# Patient Record
Sex: Female | Born: 1945 | Race: Black or African American | Hispanic: No | Smoking: Former smoker
Health system: Southern US, Community
[De-identification: ages and names within clinical notes are randomized; demographics above are authoritative.]

## PROBLEM LIST (undated history)

## (undated) DIAGNOSIS — I4729 Other ventricular tachycardia: Secondary | ICD-10-CM

## (undated) DIAGNOSIS — K219 Gastro-esophageal reflux disease without esophagitis: Secondary | ICD-10-CM

## (undated) DIAGNOSIS — I499 Cardiac arrhythmia, unspecified: Secondary | ICD-10-CM

## (undated) DIAGNOSIS — G4730 Sleep apnea, unspecified: Secondary | ICD-10-CM

## (undated) DIAGNOSIS — I714 Abdominal aortic aneurysm, without rupture, unspecified: Secondary | ICD-10-CM

## (undated) DIAGNOSIS — I10 Essential (primary) hypertension: Secondary | ICD-10-CM

## (undated) DIAGNOSIS — D649 Anemia, unspecified: Secondary | ICD-10-CM

## (undated) DIAGNOSIS — F419 Anxiety disorder, unspecified: Secondary | ICD-10-CM

## (undated) DIAGNOSIS — R569 Unspecified convulsions: Secondary | ICD-10-CM

## (undated) DIAGNOSIS — I472 Ventricular tachycardia: Secondary | ICD-10-CM

## (undated) DIAGNOSIS — I701 Atherosclerosis of renal artery: Secondary | ICD-10-CM

## (undated) DIAGNOSIS — Z95810 Presence of automatic (implantable) cardiac defibrillator: Secondary | ICD-10-CM

## (undated) DIAGNOSIS — I4581 Long QT syndrome: Secondary | ICD-10-CM

## (undated) DIAGNOSIS — I739 Peripheral vascular disease, unspecified: Secondary | ICD-10-CM

## (undated) DIAGNOSIS — G629 Polyneuropathy, unspecified: Secondary | ICD-10-CM

## (undated) DIAGNOSIS — I5022 Chronic systolic (congestive) heart failure: Secondary | ICD-10-CM

## (undated) DIAGNOSIS — Z91199 Patient's noncompliance with other medical treatment and regimen due to unspecified reason: Secondary | ICD-10-CM

## (undated) DIAGNOSIS — E785 Hyperlipidemia, unspecified: Secondary | ICD-10-CM

## (undated) DIAGNOSIS — E119 Type 2 diabetes mellitus without complications: Secondary | ICD-10-CM

## (undated) DIAGNOSIS — J90 Pleural effusion, not elsewhere classified: Secondary | ICD-10-CM

## (undated) DIAGNOSIS — I2510 Atherosclerotic heart disease of native coronary artery without angina pectoris: Secondary | ICD-10-CM

## (undated) DIAGNOSIS — I255 Ischemic cardiomyopathy: Secondary | ICD-10-CM

## (undated) DIAGNOSIS — M797 Fibromyalgia: Secondary | ICD-10-CM

## (undated) DIAGNOSIS — Z9119 Patient's noncompliance with other medical treatment and regimen: Secondary | ICD-10-CM

## (undated) HISTORY — DX: Chronic systolic (congestive) heart failure: I50.22

## (undated) HISTORY — DX: Atherosclerotic heart disease of native coronary artery without angina pectoris: I25.10

## (undated) HISTORY — DX: Abdominal aortic aneurysm, without rupture: I71.4

## (undated) HISTORY — DX: Peripheral vascular disease, unspecified: I73.9

## (undated) HISTORY — DX: Atherosclerosis of renal artery: I70.1

## (undated) HISTORY — PX: THORACENTESIS: SHX235

## (undated) HISTORY — PX: BACK SURGERY: SHX140

## (undated) HISTORY — DX: Patient's noncompliance with other medical treatment and regimen due to unspecified reason: Z91.199

## (undated) HISTORY — DX: Ventricular tachycardia: I47.2

## (undated) HISTORY — DX: Other ventricular tachycardia: I47.29

## (undated) HISTORY — DX: Long QT syndrome: I45.81

## (undated) HISTORY — DX: Hyperlipidemia, unspecified: E78.5

## (undated) HISTORY — DX: Essential (primary) hypertension: I10

## (undated) HISTORY — DX: Abdominal aortic aneurysm, without rupture, unspecified: I71.40

## (undated) HISTORY — DX: Anemia, unspecified: D64.9

## (undated) HISTORY — DX: Ischemic cardiomyopathy: I25.5

## (undated) HISTORY — DX: Patient's noncompliance with other medical treatment and regimen: Z91.19

## (undated) HISTORY — PX: CARDIAC DEFIBRILLATOR PLACEMENT: SHX171

## (undated) HISTORY — DX: Pleural effusion, not elsewhere classified: J90

---
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
## 1968-11-11 HISTORY — PX: KNEE RECONSTRUCTION: SHX5883

## 1973-02-11 HISTORY — PX: HEMORROIDECTOMY: SUR656

## 1984-02-12 HISTORY — PX: ABDOMINAL HYSTERECTOMY: SHX81

## 2001-03-30 ENCOUNTER — Encounter: Payer: Self-pay | Admitting: *Deleted

## 2001-03-30 ENCOUNTER — Inpatient Hospital Stay (HOSPITAL_COMMUNITY): Admission: RE | Admit: 2001-03-30 | Discharge: 2001-04-01 | Payer: Self-pay | Admitting: Unknown Physician Specialty

## 2001-04-01 ENCOUNTER — Encounter: Payer: Self-pay | Admitting: *Deleted

## 2001-06-03 ENCOUNTER — Ambulatory Visit (HOSPITAL_COMMUNITY): Admission: RE | Admit: 2001-06-03 | Discharge: 2001-06-03 | Payer: Self-pay | Admitting: Internal Medicine

## 2001-09-23 ENCOUNTER — Encounter: Admission: RE | Admit: 2001-09-23 | Discharge: 2001-09-23 | Payer: Self-pay | Admitting: Internal Medicine

## 2001-09-23 ENCOUNTER — Encounter: Payer: Self-pay | Admitting: Internal Medicine

## 2003-07-01 ENCOUNTER — Emergency Department (HOSPITAL_COMMUNITY): Admission: EM | Admit: 2003-07-01 | Discharge: 2003-07-01 | Payer: Self-pay | Admitting: Emergency Medicine

## 2004-07-31 ENCOUNTER — Ambulatory Visit (HOSPITAL_COMMUNITY): Admission: RE | Admit: 2004-07-31 | Discharge: 2004-07-31 | Payer: Self-pay | Admitting: Obstetrics and Gynecology

## 2005-11-25 ENCOUNTER — Ambulatory Visit: Payer: Self-pay

## 2006-01-01 ENCOUNTER — Encounter: Admission: RE | Admit: 2006-01-01 | Discharge: 2006-01-01 | Payer: Self-pay | Admitting: Internal Medicine

## 2006-01-16 ENCOUNTER — Encounter (INDEPENDENT_AMBULATORY_CARE_PROVIDER_SITE_OTHER): Payer: Self-pay | Admitting: *Deleted

## 2006-01-16 ENCOUNTER — Other Ambulatory Visit: Admission: RE | Admit: 2006-01-16 | Discharge: 2006-01-16 | Payer: Self-pay | Admitting: Interventional Radiology

## 2006-01-16 ENCOUNTER — Encounter: Admission: RE | Admit: 2006-01-16 | Discharge: 2006-01-16 | Payer: Self-pay | Admitting: Internal Medicine

## 2006-02-24 ENCOUNTER — Emergency Department (HOSPITAL_COMMUNITY): Admission: EM | Admit: 2006-02-24 | Discharge: 2006-02-24 | Payer: Self-pay | Admitting: Emergency Medicine

## 2006-04-30 ENCOUNTER — Encounter: Admission: RE | Admit: 2006-04-30 | Discharge: 2006-04-30 | Payer: Self-pay | Admitting: Family Medicine

## 2006-05-23 ENCOUNTER — Encounter: Admission: RE | Admit: 2006-05-23 | Discharge: 2006-05-23 | Payer: Self-pay | Admitting: Orthopaedic Surgery

## 2006-06-11 ENCOUNTER — Encounter: Admission: RE | Admit: 2006-06-11 | Discharge: 2006-06-11 | Payer: Self-pay | Admitting: Orthopaedic Surgery

## 2006-06-12 HISTORY — PX: LUMBAR LAMINECTOMY: SHX95

## 2006-06-25 ENCOUNTER — Ambulatory Visit (HOSPITAL_COMMUNITY): Admission: RE | Admit: 2006-06-25 | Discharge: 2006-06-26 | Payer: Self-pay | Admitting: Orthopaedic Surgery

## 2007-08-27 ENCOUNTER — Encounter: Admission: RE | Admit: 2007-08-27 | Discharge: 2007-08-27 | Payer: Self-pay | Admitting: Orthopaedic Surgery

## 2007-10-28 ENCOUNTER — Ambulatory Visit (HOSPITAL_COMMUNITY): Admission: RE | Admit: 2007-10-28 | Discharge: 2007-10-28 | Payer: Self-pay | Admitting: Orthopaedic Surgery

## 2007-11-19 ENCOUNTER — Ambulatory Visit: Payer: Self-pay

## 2007-11-19 ENCOUNTER — Ambulatory Visit: Payer: Self-pay | Admitting: Internal Medicine

## 2007-11-19 ENCOUNTER — Encounter: Payer: Self-pay | Admitting: Internal Medicine

## 2007-11-20 ENCOUNTER — Inpatient Hospital Stay (HOSPITAL_COMMUNITY): Admission: RE | Admit: 2007-11-20 | Discharge: 2007-11-24 | Payer: Self-pay | Admitting: Orthopaedic Surgery

## 2007-11-20 HISTORY — PX: POSTERIOR LUMBAR FUSION: SHX6036

## 2008-03-28 DIAGNOSIS — I4581 Long QT syndrome: Secondary | ICD-10-CM

## 2008-05-10 ENCOUNTER — Encounter: Payer: Self-pay | Admitting: Internal Medicine

## 2008-07-15 ENCOUNTER — Ambulatory Visit: Payer: Self-pay | Admitting: Internal Medicine

## 2008-07-15 DIAGNOSIS — R5383 Other fatigue: Secondary | ICD-10-CM

## 2008-07-15 DIAGNOSIS — R0989 Other specified symptoms and signs involving the circulatory and respiratory systems: Secondary | ICD-10-CM

## 2008-07-15 DIAGNOSIS — R5381 Other malaise: Secondary | ICD-10-CM | POA: Insufficient documentation

## 2008-07-15 DIAGNOSIS — R0609 Other forms of dyspnea: Secondary | ICD-10-CM

## 2008-10-24 ENCOUNTER — Encounter (INDEPENDENT_AMBULATORY_CARE_PROVIDER_SITE_OTHER): Payer: Self-pay | Admitting: *Deleted

## 2009-02-16 ENCOUNTER — Encounter (INDEPENDENT_AMBULATORY_CARE_PROVIDER_SITE_OTHER): Payer: Self-pay | Admitting: *Deleted

## 2009-02-28 ENCOUNTER — Encounter: Payer: Self-pay | Admitting: Internal Medicine

## 2009-02-28 ENCOUNTER — Ambulatory Visit: Payer: Self-pay

## 2009-03-06 ENCOUNTER — Ambulatory Visit: Payer: Self-pay | Admitting: Internal Medicine

## 2009-03-06 DIAGNOSIS — I10 Essential (primary) hypertension: Secondary | ICD-10-CM

## 2009-03-06 DIAGNOSIS — F17200 Nicotine dependence, unspecified, uncomplicated: Secondary | ICD-10-CM

## 2009-03-06 LAB — CONVERTED CEMR LAB
BUN: 8 mg/dL (ref 6–23)
Basophils Relative: 0.7 % (ref 0.0–3.0)
CO2: 30 meq/L (ref 19–32)
Calcium: 10 mg/dL (ref 8.4–10.5)
Creatinine, Ser: 0.7 mg/dL (ref 0.4–1.2)
GFR calc non Af Amer: 109.27 mL/min (ref >60)
HCT: 44 % (ref 36.0–46.0)
Lymphs Abs: 1.6 10*3/uL (ref 0.7–4.0)
MCHC: 32.9 g/dL (ref 30.0–36.0)
MCV: 88.6 fL (ref 78.0–100.0)
Monocytes Absolute: 0.4 10*3/uL (ref 0.1–1.0)
Monocytes Relative: 5.4 % (ref 3.0–12.0)
Neutrophils Relative %: 69.7 % (ref 43.0–77.0)
Platelets: 287 10*3/uL (ref 150.0–400.0)
Prothrombin Time: 10.6 s (ref 9.1–11.7)
RBC: 4.97 M/uL (ref 3.87–5.11)
RDW: 13.8 % (ref 11.5–14.6)
WBC: 7 10*3/uL (ref 4.5–10.5)

## 2009-03-13 ENCOUNTER — Ambulatory Visit: Payer: Self-pay | Admitting: Internal Medicine

## 2009-03-13 ENCOUNTER — Ambulatory Visit (HOSPITAL_COMMUNITY): Admission: RE | Admit: 2009-03-13 | Discharge: 2009-03-13 | Payer: Self-pay | Admitting: Internal Medicine

## 2009-03-22 ENCOUNTER — Encounter: Payer: Self-pay | Admitting: Internal Medicine

## 2009-03-23 ENCOUNTER — Ambulatory Visit: Payer: Self-pay

## 2009-03-23 ENCOUNTER — Encounter: Payer: Self-pay | Admitting: Internal Medicine

## 2009-06-21 ENCOUNTER — Encounter (INDEPENDENT_AMBULATORY_CARE_PROVIDER_SITE_OTHER): Payer: Self-pay | Admitting: *Deleted

## 2009-09-10 ENCOUNTER — Emergency Department (HOSPITAL_COMMUNITY): Admission: EM | Admit: 2009-09-10 | Discharge: 2009-09-10 | Payer: Self-pay | Admitting: Emergency Medicine

## 2009-09-13 ENCOUNTER — Encounter (INDEPENDENT_AMBULATORY_CARE_PROVIDER_SITE_OTHER): Payer: Self-pay | Admitting: *Deleted

## 2009-12-28 ENCOUNTER — Encounter (INDEPENDENT_AMBULATORY_CARE_PROVIDER_SITE_OTHER): Payer: Self-pay | Admitting: *Deleted

## 2010-02-13 ENCOUNTER — Encounter (INDEPENDENT_AMBULATORY_CARE_PROVIDER_SITE_OTHER): Payer: Self-pay | Admitting: *Deleted

## 2010-03-02 ENCOUNTER — Encounter: Payer: Self-pay | Admitting: Internal Medicine

## 2010-03-04 ENCOUNTER — Encounter: Payer: Self-pay | Admitting: Orthopaedic Surgery

## 2010-03-12 ENCOUNTER — Emergency Department (HOSPITAL_COMMUNITY)
Admission: EM | Admit: 2010-03-12 | Discharge: 2010-03-12 | Payer: Self-pay | Source: Home / Self Care | Admitting: Emergency Medicine

## 2010-03-12 LAB — GLUCOSE, CAPILLARY: Glucose-Capillary: 102 mg/dL — ABNORMAL HIGH (ref 70–99)

## 2010-03-13 NOTE — Letter (Signed)
Summary: Appointment - Missed  [facility], Main Office  [address]   [place], [place] [address]   Phone: [phone]  Fax: [phone]     Jun 21, 2009 MRN: [id number]   Lori York [address] [place], [place]  [address]   Dear Ms. Backhaus,  Our records indicate you missed your appointment on 5/9 with Dr Johney Frame. It is very important that we reach you to reschedule this appointment. We look forward to participating in your health care needs. Please contact us at the number listed above at your earliest convenience to reschedule this appointment.     Sincerely,   Ruel Favors Scheduling Team

## 2010-03-13 NOTE — Assessment & Plan Note (Signed)
Summary: PER CHECK OUT/SF   Visit Type:  Follow-up Primary Provider:  Velna Hatchet MD   History of Present Illness: The patient presents today for routine electrophysiology followup. She reports doing very well since last being seen in our clinic. She has been noncompliant with office follow-ups, despite letters being sent to her.  She now presents after her ICD has reached ERI.  The patient denies symptoms of palpitations, chest pain, shortness of breath, orthopnea, PND, lower extremity edema, dizziness, presyncope, syncope, or neurologic sequela. The patient is tolerating medications without difficulties and is otherwise without complaint today.   Current Medications (verified): 1)  Metformin Hcl 500 Mg Xr24h-Tab (Metformin Hcl) .Marland Kitchen.. 1 By Mouth Once Daily 2)  Lotrel 10-40 Mg Caps (Amlodipine Besy-Benazepril Hcl) .Marland Kitchen.. 1 By Mouth Once Daily 3)  Vitamin D (Ergocalciferol) 50000 Unit Caps (Ergocalciferol) .... Weekly 4)  Nexium 40 Mg Cpdr (Esomeprazole Magnesium) .Marland Kitchen.. 1 By Mouth Once Daily 5)  Zolpidem Tartrate 10 Mg Tabs (Zolpidem Tartrate) .Marland Kitchen.. 1 By Mouth At Bedtime 6)  Paroxetine Hcl 20 Mg Tabs (Paroxetine Hcl) .Marland Kitchen.. 1 By Mouth Once Daily 7)  Glipizide 10 Mg Xr24h-Tab (Glipizide) .Marland Kitchen.. 1 By Mouth Once Daily 8)  Nadolol 20 Mg Tabs (Nadolol) .Marland Kitchen.. 1 By Mouth Once Daily  Allergies: 1)  ! Penicillin  Past History:  Past Medical History:  1. Long QT syndrome with family history of sudden cardiac death.   2. Status post pacemaker implantation in 1997 with revision to a  Guidant model 1860 single-chamber implantable cardioverter-  defibrillator on March 31, 2001.   3. Diabetes mellitus.   4. Hypertension.   5. History of glaucoma.   Past Surgical History: Reviewed history from 03/28/2008 and no changes required. 1.  Status post pacemaker implantation in 1997 with revision to a Guidant model 1860 single-chamber implantable cardioverter-  defibrillator on March 31, 2001.  2.   Laminectomy in May 2008. 3.  Total abdominal hysterectomy in 1986.  4.  Right knee reconstruction in 1970.  5.  Laminectomy/ L5-S1 fusion 11/20/07  Family History: Reviewed history from 03/28/2008 and no changes required. The patient has a history of long QT syndrome in her mother, daughter, sister, and brother, all died suddenly.  Social History: Reviewed history from 03/28/2008 and no changes required.  [place] with her adopted son who is 41 years old and a godbrother, who is 68.  She is disabled.  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  She smokes less than one-half pack per day and has no interest in quitting at this time.  She denies alcohol or drug use.   Review of Systems       All systems are reviewed and negative except as listed in the HPI.   Remains very anxious.  Significant social stress since the death of her son last year.  Vital Signs:  Patient profile:   65 year old female Height:      64 inches Weight:      203 pounds BMI:     34.97 Pulse rate:   67 / minute BP sitting:   156 / 98  (left arm)  Vitals Entered By: Laurance Flatten CMA (March 06, 2009 9:51 AM)  Physical Exam  General:  obese, NAD Head:  normocephalic and atraumatic Eyes:  PERRLA/EOM intact; conjunctiva and lids normal. Nose:  no deformity, discharge, inflammation, or lesions Mouth:  Teeth, gums and palate normal. Oral mucosa normal. Neck:  Neck supple, no JVD. No masses, thyromegaly or abnormal cervical nodes. Chest Wall:  ICD pocket well healed Lungs:  Clear bilaterally to auscultation and percussion. Heart:  Non-displaced PMI, chest non-tender; regular rate and rhythm, S1, S2 without murmurs, rubs or gallops. Carotid upstroke normal, no bruit. Normal abdominal aortic size, no bruits. Femorals normal pulses, no bruits. Pedals normal pulses. No edema, no varicosities. Abdomen:  Bowel sounds positive; abdomen soft and non-tender  without masses, organomegaly, or hernias noted. No hepatosplenomegaly. Msk:  Back normal, normal gait. Muscle strength and tone normal. Pulses:  pulses normal in all 4 extremities Extremities:  No clubbing or cyanosis. Neurologic:  Alert and oriented x 3. Skin:  Intact without lesions or rashes. Cervical Nodes:  no significant adenopathy Psych:  Normal affect.    ICD Specifications Following MD:  Hillis Range, MD     ICD Vendor:  [facility] [employer]     ICD Model Number:  [phone]     ICD Serial Number:  [id number] ICD DOI:  03/31/2001     ICD Implanting MD:  Sherryl Manges, MD  Lead 1:    Location: RV     DOI: 03/31/2001     Model #: [id number]     Serial #: [id number]     Status: active  Indications::  LONG QT   Episodes Coumadin:  No  Brady Parameters Mode VVI     Lower Rate Limit:  60      Tachy Zones VF:  190     VT:  160     Impression & Recommendations:  Problem # 1:  LONG QT SYNDROME (ICD-426.82)  The patient has a h/o long QT syndrome for which an ICD has previously been implanted for primary prevention of sudden death.  She has been noncompliant with office follow-up and now presents with a device at [facility].  She reports compliance with nadolol.   Risks, benefits, alternatives to ICD pulse generator replacement were discussed in detail with the patient today.  She understands that the risks include but are not limited to bleeding, infection, MI, stroke, death, inappropriate shocks, and lead revision.  She accepts these risks and wishes to proceed.  We will therefore plan on ICD pulse generator replacement at the next available time.   Her updated medication list for this problem includes:    Lotrel 10-40 Mg Caps (Amlodipine besy-benazepril hcl) .Marland Kitchen... 1 by mouth once daily    Nadolol 20 Mg Tabs (Nadolol) .Marland Kitchen... 1 by mouth once daily  Orders: TLB-BMP (Basic Metabolic Panel-BMET) (80048-METABOL) TLB-CBC Platelet - w/Differential (85025-CBCD) TLB-PT (Protime) (85610-PTP) TLB-PTT  (85730-PTTL)  Problem # 2:  SNORING (ICD-786.09) Pt has snoring and fatigue but continues to decline sleep study, despite my recommendation.  Problem # 3:  ESSENTIAL HYPERTENSION, BENIGN (ICD-401.1) stable no changes today  Her updated medication list for this problem includes:    Lotrel 10-40 Mg Caps (Amlodipine besy-benazepril hcl) .Marland Kitchen... 1 by mouth once daily    Nadolol 20 Mg Tabs (Nadolol) .Marland Kitchen... 1 by mouth once daily  Problem # 4:  TOBACCO ABUSE (ICD-305.1) cessation advised

## 2010-03-13 NOTE — Procedures (Signed)
Summary: DEVICE/SAF   Current Medications (verified): 1)  Metformin Hcl 500 Mg Xr24h-Tab (Metformin Hcl) .Marland Kitchen.. 1 By Mouth Once Daily 2)  Lotrel 10-40 Mg Caps (Amlodipine Besy-Benazepril Hcl) .Marland Kitchen.. 1 By Mouth Once Daily 3)  Vitamin D (Ergocalciferol) 50000 Unit Caps (Ergocalciferol) .... Weekly 4)  Nexium 40 Mg Cpdr (Esomeprazole Magnesium) .Marland Kitchen.. 1 By Mouth Once Daily 5)  Zolpidem Tartrate 10 Mg Tabs (Zolpidem Tartrate) .Marland Kitchen.. 1 By Mouth At Bedtime 6)  Paroxetine Hcl 20 Mg Tabs (Paroxetine Hcl) .Marland Kitchen.. 1 By Mouth Once Daily 7)  Glipizide 10 Mg Xr24h-Tab (Glipizide) .Marland Kitchen.. 1 By Mouth Once Daily 8)  Nadolol 20 Mg Tabs (Nadolol) .Marland Kitchen.. 1 By Mouth Once Daily  Allergies (verified): 1)  ! Penicillin    ICD Specifications Following MD:  Hillis Range, MD     ICD Vendor:  [facility] [employer]     ICD Model Number:  [phone]     ICD Serial Number:  [id number] ICD DOI:  03/31/2001     ICD Implanting MD:  Sherryl Manges, MD  Lead 1:    Location: RV     DOI: 03/31/2001     Model #: [id number]     Serial #: [id number]     Status: active  Indications::  LONG QT   ICD Follow Up Remote Check?  No Battery Voltage:  2.39 V     Charge Time:  20.2 seconds     Battery Est. Longevity:  ERI Underlying rhythm:  SR ICD Dependent:  No       ICD Device Measurements Right Ventricle:  Amplitude: 14.1 mV, Impedance: 452 ohms, Threshold: 0.8 V at 0.4 msec Shock Impedance: 47 ohms   Episodes Coumadin:  No Shock:  0     ATP:  0     Nonsustained:  0     Ventricular Pacing:  13%  Brady Parameters Mode VVI     Lower Rate Limit:  60      Tachy Zones VF:  200     VT:  160     Tech Comments:  Device @ ERI 12/27/2008 although the patient has only heard the "beeping" in the last week.  Device reprogrammed to 1 zone VF @ 200 per Dr. Johney Frame.  We will schedule her ASAP for an office visit to anticipate change out of the device. Altha Harm, LPN  February 28, 2009 10:28 AM

## 2010-03-13 NOTE — Letter (Signed)
Summary: Appointment - Missed  [facility], Main Office  [address]   [place], [place] [address]   Phone: [phone]  Fax: [phone]     September 13, 2009 MRN: [id number]   Lori York [address] [place], [place]  [address]   Dear [name],  Our records indicate you missed your appointment on July 22nd, 2011 with Dr. Johney Frame. It is very important that we reach you to reschedule this appointment. We look forward to participating in your health care needs. Please contact us at the number listed above at your earliest convenience to reschedule this appointment.     Sincerely,    Glass blower/designer

## 2010-03-13 NOTE — Letter (Signed)
Summary: Device-Delinquent Check  [facility], Main Office  [address]   [place], [place] [address]   Phone: [phone]  Fax: [phone]     December 28, 2009 MRN: [id number]   Lori York [address] [place], [place]  [address]   Dear Ms. Wordell,  According to our records, you have not had your implanted device checked in the recommended period of time.  We are unable to determine appropriate device function without checking your device on a regular basis.  Please call our office to schedule an appointment, with Dr Johney Frame,  as soon as possible.  If you are having your device checked by another physician, please call us so that we may update our records.  Thank you,  Letta Moynahan, EMT  December 28, 2009 10:48 AM  [facility]

## 2010-03-13 NOTE — Cardiovascular Report (Signed)
Summary: Pre Op Orders  Pre Op Orders   Imported By: Roderic Ovens 03/09/2009 11:15:03  _____________________________________________________________________  External Attachment:    Type:   Image     Comment:   External Document

## 2010-03-13 NOTE — Letter (Signed)
Summary: Appointment - Missed  [facility], Main Office  [address]   [place], [place] [address]   Phone: [phone]  Fax: [phone]     February 16, 2009 MRN: [id number]   Lori York [address] [place], [place]  [address]   Dear [name],  Our records indicate you missed your appointment on 01/23/09 with Dr. Johney Frame.                                    It is very important that we reach you to reschedule this appointment. We look forward to participating in your health care needs. Please contact us at the number listed above at your earliest convenience to reschedule this appointment.     Sincerely,    Glass blower/designer

## 2010-03-13 NOTE — Cardiovascular Report (Signed)
Summary: Office Visit   Office Visit   Imported By: Roderic Ovens 04/04/2009 11:59:04  _____________________________________________________________________  External Attachment:    Type:   Image     Comment:   External Document

## 2010-03-13 NOTE — Miscellaneous (Signed)
Summary: Device change out  Clinical Lists Changes  Observations: Added new observation of ICD IMP MD: Hillis Range, MD (03/22/2009 15:34) Added new observation of ICD IMPL DTE: 03/13/2009 (03/22/2009 15:34) Added new observation of ICD SERL#: [id number]  (03/22/2009 15:34) Added new observation of ICD MODL#: [id number]  (03/22/2009 15:34) Added new observation of ICDMANUFACTR: St Jude  (03/22/2009 15:34) Added new observation of ICDEXPLCOMM: 03/13/2009 [employer] Prizm [id number]/126911 explanted  (03/22/2009 15:34)       ICD Specifications Following MD:  Hillis Range, MD     ICD Vendor:  [employer]     ICD Model Number:  [id number]     ICD Serial Number:  [id number] ICD DOI:  03/13/2009     ICD Implanting MD:  Hillis Range, MD  Lead 1:    Location: RV     DOI: 03/31/2001     Model #: [id number]     Serial #: [id number]     Status: active  Indications::  LONG QT  Explantation Comments: 03/13/2009 [facility] [employer] Prizm [id number]/126911 explanted  ICD Follow Up ICD Dependent:  No      Episodes Coumadin:  No  Brady Parameters Mode VVI     Lower Rate Limit:  60      Tachy Zones VF:  200     VT:  160

## 2010-03-13 NOTE — Cardiovascular Report (Signed)
Summary: Office Visit   Office Visit   Imported By: Roderic Ovens 03/09/2009 14:03:13  _____________________________________________________________________  External Attachment:    Type:   Image     Comment:   External Document

## 2010-03-13 NOTE — Letter (Signed)
Summary: Implantable Device Instructions  [facility], Main Office  [address]   [place], [place] [address]   Phone: [phone]  Fax: [phone]      Implantable Device Instructions  You are scheduled for:  _____ Generator Change  on 03/13/09 with Dr. Johney Frame  1.  Please arrive at the Short Stay Center at [facility] at 7:00am on the day of your procedure.  2.  Do not eat or drink after midnight  the night before your procedure.  3.  Complete lab work on 03/06/09  The lab at [facility] is open from 8:30 AM to 1:30 PM and from 2:30 PM to 5:00 PM.  The lab at [facility] is open from 7:30 AM to 5:30 PM.  You do not have to be fasting.  4.  Do NOT take these medications for the  days of your procedure:  Glipizide    5.  Plan for an overnight stay.  Bring your insurance cards and a list of your medications.  6.  Wash your chest and neck with antibacterial soap (any brand) the evening before and the morning of your procedure.  Rinse well.   *If you have ANY questions after you get home, please call the office [phone].  Anselm Pancoast  *Every attempt is made to prevent procedures from being rescheduled.  Due to the nauture of Electrophysiology, rescheduling can happen.  The physician is always aware and directs the staff when this occurs.

## 2010-03-13 NOTE — Procedures (Signed)
Summary: wound check/jml   Current Medications (verified): 1)  Metformin Hcl 500 Mg Xr24h-Tab (Metformin Hcl) .Marland Kitchen.. 1 By Mouth Once Daily 2)  Lotrel 10-40 Mg Caps (Amlodipine Besy-Benazepril Hcl) .Marland Kitchen.. 1 By Mouth Once Daily 3)  Vitamin D (Ergocalciferol) 50000 Unit Caps (Ergocalciferol) .... Weekly 4)  Nexium 40 Mg Cpdr (Esomeprazole Magnesium) .Marland Kitchen.. 1 By Mouth Once Daily 5)  Zolpidem Tartrate 10 Mg Tabs (Zolpidem Tartrate) .Marland Kitchen.. 1 By Mouth At Bedtime 6)  Paroxetine Hcl 20 Mg Tabs (Paroxetine Hcl) .Marland Kitchen.. 1 By Mouth Once Daily 7)  Glipizide 10 Mg Xr24h-Tab (Glipizide) .Marland Kitchen.. 1 By Mouth Once Daily 8)  Nadolol 20 Mg Tabs (Nadolol) .Marland Kitchen.. 1 By Mouth Once Daily  Allergies (verified): 1)  ! Penicillin   ICD Specifications Following MD:  Hillis Range, MD     ICD Vendor:  [employer] Jude     ICD Model Number:  [id number]     ICD Serial Number:  [id number] ICD DOI:  03/13/2009     ICD Implanting MD:  Hillis Range, MD  Lead 1:    Location: RV     DOI: 03/31/2001     Model #: [id number]     Serial #: [id number]     Status: active  Indications::  LONG QT  Explantation Comments: 03/13/2009 [employer] Prizm 1860/126911 explanted  ICD Follow Up Remote Check?  No Charge Time:  8.4 seconds     Battery Est. Longevity:  9.4 years Underlying rhythm:  SR ICD Dependent:  No       ICD Device Measurements Right Ventricle:  Amplitude: 12 mV, Impedance: 350 ohms, Threshold: 1.0 V at 0.4 msec Shock Impedance: 46 ohms   Episodes Coumadin:  No Shock:  0     ATP:  0     Nonsustained:  0     Ventricular Pacing:  0%  Brady Parameters Mode VVI     Lower Rate Limit:  60      Tachy Zones VF:  200     VT:  160     Next Cardiology Appt Due:  06/12/2009 Tech Comments:  No parameter changes.  Device function normal.  Steri strips removed, no redness or edema.  ROV 3 months Dr. Johney Frame. Altha Harm, LPN  March 23, 2009 2:40 PM

## 2010-03-15 NOTE — Letter (Signed)
Summary: Device-Delinquent Check  [facility], Main Office  [address]   [place], [place] [address]   Phone: [phone]  Fax: [phone]     February 13, 2010 MRN: [id number]   Lori York [address] [place], [place]  [address]   Dear Ms. Shin,  According to our records, you have not had your implanted device checked in the recommended period of time.  We are unable to determine appropriate device function without checking your device on a regular basis.  Please call our office to schedule an appointment, with Dr Johney Frame, as soon as possible.  If you are having your device checked by another physician, please call us so that we may update our records.  Thank you,  Letta Moynahan, EMT  February 13, 2010 2:17 PM  [facility]

## 2010-04-04 NOTE — Cardiovascular Report (Signed)
Summary: Certified Letter Returned - Not doing f/u  Certified Letter Returned - Not doing f/u   Imported By: Debby Freiberg 03/26/2010 11:21:33  _____________________________________________________________________  External Attachment:    Type:   Image     Comment:   External Document

## 2010-04-29 LAB — GLUCOSE, CAPILLARY: Glucose-Capillary: 166 mg/dL — ABNORMAL HIGH (ref 70–99)

## 2010-06-14 ENCOUNTER — Encounter: Payer: Self-pay | Admitting: *Deleted

## 2010-06-26 NOTE — Op Note (Signed)
Lori York, Lori York             ACCOUNT NO.:  [id number]   MEDICAL RECORD NO.:  [id number]          PATIENT TYPE:  INP   LOCATION:  5035                         FACILITY:  [facility]   PHYSICIAN:  Mark C. Ophelia Charter, M.D.    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   PROCEDURES:  1. L5-S1 right transforaminal lumbar interbody fusion, removal of      recurrent herniated nucleus pulposus, and microdiskectomy.  2. Central decompression, L4-5.  3. Bilateral pedicle instrumentation PEEK cage plus local interbody      bone and bilateral intertransverse process fusion.   ESTIMATED BLOOD LOSS:  300 mL.   RE-TRANSFUSION:  150 mL.   DRAINS:  None.   INSTRUMENTATION:  Biomet 6.5 x 40 mm screws at L5, 7.5 x 35 mm screws at  S1 with 40-mm Biomet Polaris titanium rods.   PROCEDURE:  After induction of general anesthesia, the patient was  placed on the spine frame prone with careful padding and positioning  arms and shoulders less than 90 and elbows at 90 degrees.  Extra pads  were placed along the frame for her abdomen in any place where there was  skin contact with the rail.  Eye pads, iliac crest pads large, and chest  pad were used.  The back was prepped with DuraPrep and the area was  squared with towels.  The old incision was outlined with a sterile skin  marker and Betadine Vi-Drape and laminectomy sheet drape were applied.  Since the patient had previous procedure for microdiskectomy incision  was present, an incision was made using the old scar extending it  proximally and distally.  Subperiosteal dissection and placement of  large Viper retractor after transverse  process of L5 had been identified  bilaterally.  A Kocher clamp was angled and placed directly over the L5-  S1 interspace by C-arm visualization due to her extra large size, it was  difficult to visualize this, and a plain lateral x-ray was taken and  sent out to Radiology for review.  By this point, the central  decompression had been performed, and a #4 Penfield was placed on the  lateral gutter on the left since this side did not have lot of scar  tissue, and x-ray showed that the probe was just below the L5-S1 disk  space.  Scar tissue was taken down.  The operative microscope was  draped.  A portion of spinous process of L4 and about half the lamina  was taken for removing chunks of thick ligament which was causing some  moderate stenosis.  Minimal bone was taken at this level just primarily  ligament for the central decompression, which was up to the inferior  aspect of the L4 vertebral endplate.  There was hypertrophic ligamentum  with a large disk herniation scarred down to the dura.  Extensive  dissection was required on the right facet, it was taken at L5-S1.  Nerve root above was visualized, and the foraminotomy was done to the S1  nerve root.  With microdissection, the disk was gently teased off from  the dura, annulus was incised, and chunks of disk were removed, and the  Epstein curettes were used to push disk material down into the space and  then removed it.  Anterior annulus was intact.  Combination of angled  ring curette, right and left curettes, straight curettes, rasps, and  large upbiting micro pituitaries were all used.  Once decompression was  finished, the straight chisels and box were used up to #11 size, trial  11 size showed excellent fit, and decorticated bone from the laminectomy  and decompression as well as removal of the facet on the right side were  meticulously cleaned and small pieces of bone graft packed anteriorly  and some in the 11-mm PEEK cage,  which was then inserted.  Fluoroscopic  pictures were taken on the way in to confirm position and then the cage  was advanced until the 3 metal markers in the cage were in excellent  position on AP and lateral.  Cage was anterior and bone graft was  anterior to this.  After irrigation, pedicle screws were placed.  Sequence was the same starting with a sharp probe followed by C-arm  check, joystick advancement followed by C-arm check, blunt ball-tip  probe tapping, decortication of the transverse process, and then  placement of the screw.  The 7.5 screws were placed to sacrum, 6.5 x 40  in the L5 vertebra, all screws were in the pedicle, and all had bony  anterior.  Inside the canal, a hockey-stick was used for palpation of  the pedicles to make sure that the screws do not enter the canal.  After  irrigation, final spot fluoro pictures were taken and 40-mm rods were  inserted.  The TLIF side was compressed first followed by the opposite  side with a double click using the compressor.  A 1-2 mm of rod was  placed past each screw with excellent compression.  Spot fluoro pictures  taken and deep fascia was closed with 0 Vicryl, 2-0 Vicryl, subcutaneous  tissue, skin closure, postop dressing and Mepilex.  The patient was  transferred to recovery room in stable condition.  Instrument count and  needle count were correct.      Mark C. Ophelia Charter, M.D.  Electronically Signed     MCY/MEDQ  D:  11/20/2007  T:  11/21/2007  Job:  [id number]

## 2010-06-26 NOTE — Assessment & Plan Note (Signed)
[facility]                         ELECTROPHYSIOLOGY OFFICE NOTE   Lori York, Lori York                    MRN:          [id number]  DATE:11/19/2007                            DOB:          14-Aug-1945    PRIMARY ELECTROPHYSIOLOGIST:  Doylene Canning. Ladona Ridgel, MD.   REFERRING PHYSICIAN:  Dr. Annell Greening, Orthopedics.   PRIMARY CARE PHYSICIAN:  Dr. Kern Reap.   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   PROBLEM LIST:  1. Long QT syndrome with family history of sudden cardiac death.  2. Status post pacemaker implantation in 1997 with revision to a      Guidant model 1860 single-chamber implantable cardioverter-      defibrillator on March 31, 2001.  3. Diabetes mellitus.  4. Hypertension.  5. History of glaucoma.  6. Status post laminectomy in May 2008.  7. Gravida 3 and Para 3.  8. Status post total abdominal hysterectomy in 1986.  9. Status post right knee reconstruction in 1970.  10.Status post laminectomy in May 2008.   ALLERGIES:  PENICILLIN.   MEDICATIONS:  The patient has been mostly noncompliant with her  medications recently.  She is not taken her Toprol-XL over a year.  Her  previous medications were as follows;  1. Toprol-XL 100 mg b.i.d.  2. Nexium 40 mg daily.  3. Ambien 10 mg nightly.  4. Clonidine 0.2 mg t.i.d.  5. Paxil 10 mg daily.  6. Norvasc 10 mg daily.   SOCIAL HISTORY:  [place] with  her adopted son who is 62 years old and a godbrother, who is 89.  She is  disabled.  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  She smokes less than one-half pack per day and has no interest  in quitting at this time.  She denies alcohol or drug use.   FAMILY HISTORY:  The patient has a history of long QT syndrome in her  mother, daughter, sister,  and brother, all died suddenly.   REVIEW OF SYSTEMS:  All systems reviewed and negative, except as  outlined in the HPI above and is documented below.  The patient reports  occasional anxiety since stopping her Paxil, and also has difficulty  sleeping since stopping her Ambien.   INTERVAL HISTORY:  Lori York presents today for followup.  She  anticipates having a lumbar fusion tomorrow and has been referred back  to our office for cardiac clearance.  She reports doing reasonably  well recently.  She denies episodes of chest pain, palpitations, near-  syncope, or syncope.  She denies any ICD shocks.  The patient reports  occasional fatigue and dyspnea.  She has been able to walk on occasion  approximately 1 mile and stops due to dyspnea and fatigue.  She does her  housework typically without symptoms of chest pain or shortness of  breath.  She is, otherwise, without complaint at this time.   PHYSICAL EXAMINATION:  VITAL SIGNS:  Blood pressure 157/98, heart rate  86, and weight 230 pounds.  GENERAL:  The patient is an obese female in no acute distress.  She is  mildly hostile, but consolable.  She has a very flat, almost depressed  mood and affect.  HEENT:  Normocephalic and atraumatic.  Sclerae are clear.  Conjunctivae  pink.  Oropharynx is clear.  NECK:  Supple.  No JVD, lymphadenopathy, or bruits.  LUNGS:  Clear to auscultation bilaterally.  HEART:  Regular rate and rhythm, 1/6 systolic ejection murmur at the  left lower sternal border.  GI:  Soft, nontender, and nondistended.  Positive bowel sounds.  EXTREMITIES:  No clubbing or cyanosis.  Trace lower extremity edema  bilaterally.  NEUROLOGIC:  Strength and sensation are intact.  MUSCULOSKELETAL:  No deformity or atrophy.  SKIN:  The patient's left ICD pocket is well healed with no evidence of  active infection.   DEVICE INTERROGATION:  The patient's single chamber ICD is interrogated  today and found to be functioning  appropriately in the VVI pacing mode  with a lower alignment of 60 beats per minute.  The R-wave measures 12.8  mV for an impedance of 439 ohms and a threshold of 1 volt at 0.4 msec.  The shock impedance is 42 ohms.  The battery voltage is 2.58 volts with  a charge time of 14.3 seconds.  No tachycardias have been detected and  no therapies have been delivered.  No changes were made today.   IMPRESSION:  Lori York is a 65 year old female with diabetes,  hypertension, and long QT syndrome status post ICD implantation, who  presents today for Electrophysiology followup.  Her device is  functioning appropriately with no ventricular arrhythmias since last  followup in our clinic.  The patient has been noncompliant with her care  and has not been seen in our clinic since approximately 2004.  She has  also discontinued her beta-blocker therapy.  The patient has multiple  risk factors for coronary artery disease including hypertension and  diabetes.  Her functional status seems reasonable and she did not have  obvious symptoms of ischemia.  I am, however, a little concerned that we  may have some difficulty assessing her symptoms of ischemia due to her  relatively sedentary lifestyle.   PLAN:  The patient's Toprol-XL was restarted today at 50 mg b.i.d.  We  would then titrate this to a goal heart rate of 60 if her blood pressure  allows.  I have also refilled the patient's Ambien and Paxil due to  symptoms of anxiety and insomnia.  I have given her a 30-day supply of  these 2 medications and instructed her to follow up with her primary  care physician for further treatment.  We will obtain an echocardiogram  today to evaluate for structural heart disease and also an adenosine,  maybe to further risk stratify the patient for coronary artery disease.  Should the patient's echocardiogram revealed no significant structural  heart disease and her Myoview showed no significant ischemia, I think it   would be reasonable for the patient to proceed to surgery with low-to-  moderate risk for cardiac event perioperatively.  Her ICD will need to  be programmed with tachycardia detections off during surgery and this  can be arranged through [employer].  I have asked the patient to  return to our clinic in 6 months for routine followup.     Hillis Range, MD  Electronically Signed  JA/MedQ  DD: 11/19/2007  DT: 11/20/2007  Job #: [id number]   cc:   Veverly Fells. Ophelia Charter, M.D.  Olene Craven, M.D.  Arturo Morton. Riley Kill, MD, [facility]

## 2010-06-29 NOTE — Procedures (Signed)
[place]  Patient:    Lori York, Lori York Visit Number: [id number] MRN: [id number]          Service Type: DSU Location: [phone] Attending Physician:  Darlin Priestly Dictated by:   Doylene Canning. Ladona Ridgel, M.D. [facility] Proc. Date: 03/31/01 Admit Date:  03/30/2001   CC:         Lenise Herald, M.D.  Kathrine Cords   Procedure Report  PROCEDURE:  Implantation of a single-chamber defibrillator.  INDICATION:  Prolonged QT syndrome with a malignant family history for sudden cardiac death, in a setting of prior permanent pacemaker implantation with the pacemaker battery at [facility].  INTRODUCTION:  The patient is a very pleasant middle-aged woman who was admitted to the hospital for pacemaker generator change.  She has a history of long QT syndrome with correct QTs of over 600 msec.  She has a strong family history of sudden cardiac death with a sister, brother, daughter, uncle, and mother all dying suddenly.  She is now referred for pacemaker generator removal and implantation of an implantable cardioverter-defibrillator.  DESCRIPTION OF PROCEDURE:  After informed consent was obtained, the patient was taken to the diagnostic EP lab in the fasted state.  After the usual preparation and draping, intravenous fentanyl and midazolam were given for sedation.  A total of 40 cc of lidocaine was infiltrated into the left infraclavicular region, where the old pacemaker scar was located.  An 11 cm incision was carried out over this region, and electrocautery was utilized to dissect down to the subpectoralis fascia and the pacemaker pocket.  The pocket was subsequently opened and the generator dissected out and removed.  The old pacing leads were interrogated, and R-waves were measured at 14 millivolts and the P-wave measured 3.8 millivolts with atrial and ventricular pacing impedances of 422 and 469 Ohms, respectively.  The atrial and ventricular thresholds were 0.9  and 1.4 volts, respectively, at 0.5 msec.  These leads were subsequently capped.  The left subclavian vein was then punctured and the Guidant model [id number] active-fixation defibrillation lead was advanced through the subclavian vein and into the right ventricle.  There were dense fibrous adhesions at the junction of the vein as well as at the SVC-right atrial junction.  At this point the mapping was carried out at the right ventricle, and at the final site R-waves measured 15 millivolts with a pacing threshold of 0.5 volts at 0.5 msec with a pacing impedance of 589 Ohms once the lead was actively fixed.  Ten-volt pacing did not result in diaphragmatic stimulation.  At this point the leads were secured to the subpectoralis fascia with a figure-of-eight silk suture.  The sewing sleeves were also secured with silk suture.  The subcutaneous pocket was enlarged, and the generator was connected to the defibrillation lead.  The generator was a [employer] AICD, model [id number], serial number [id number].  The generator was secured to the fascia with a silk suture.  Kanamycin irrigation was utilized to irrigate the pocket both before and after generator placement.  Defibrillation threshold testing was then carried out.  After the patient was deeply sedated with intravenous fentanyl and Versed, a T-wave shock initiated ventricular fibrillation.  Fourteen joules subsequently restored sinus rhythm.  Five minutes were allowed to elapse, and the second DFT test was carried out.  Again a T-wave shock initiated ventricular fibrillation and a 14-joule shock restored sinus rhythm.  Charge time was eight seconds.  At this point no additional  DFT testing was carried out.  The incision was closed with a layer of 2-0 Vicryl, followed by a layer of 3-0 Vicryl, followed by a layer of 4-0 Vicryl.  Benzoin was painted on the skin and Steri-Strips were applied and a pressure dressing placed, and the  patient returned to her room in satisfactory condition.  COMPLICATIONS:  There were no immediate procedural complications.  RESULTS:  This demonstrates successful implantation of a Guidant single-chamber defibrillator, followed by removal of the permanent pacemaker generator, with capping of the atrial and ventricular pacing leads.  There were no immediate procedural complications. Dictated by:   Doylene Canning. Ladona Ridgel, M.D. [facility] Attending Physician:  Darlin Priestly DD:  03/31/01 TD:  03/31/01 Job: [id number]

## 2010-06-29 NOTE — Op Note (Signed)
NAMEKHANIYA, TENAGLIA             ACCOUNT NO.:  [id number]   MEDICAL RECORD NO.:  [id number]          PATIENT TYPE:  OIB   LOCATION:  5002                         FACILITY:  [facility]   PHYSICIAN:  Mark C. Ophelia Charter, M.D.    DATE OF BIRTH:  1945/07/27   DATE OF PROCEDURE:  06/25/2006  DATE OF DISCHARGE:                               OPERATIVE REPORT   PREOPERATIVE DIAGNOSIS:  Right L5-S1 herniated nucleus pulposus.   POSTOPERATIVE DIAGNOSIS:  Right L5-S1 herniated nucleus pulposus.   PROCEDURE:  Right L5-S1 microdiskectomy.   SURGEON:  Annell Greening, MD   ANESTHESIA:  GOT plus Marcaine local.   ASSISTANT:  Maud Deed, PA-C   ESTIMATED BLOOD LOSS:  Minimal.   PROCEDURE:  After induction of general anesthesia and orotracheal  intubation, the patient was placed prone on chest rolls due to her large  size.  Back was prepped DuraPrep.  There was squared with towels,  Betadine V-Y-drape applied, and laminectomy sheet.  Needle localization  was placed.  Due to her extremely large size it was difficult to palpate  bony landmarks and the needle was a level and one half high.  Needle was  then moved down about the appropriate level and a second x-ray was taken  which showed that the needle was just inferior to the disk space.  An  incision was made slightly longer than normal due to the patient's  extremely thick adipose layer.  Taylor retractor extra deep was placed  laterally.  There was a gap between the lamina posteriorly between S1-  S2.  The sacrum was identified.  The ligamentum was incised, epidural  fat was noted, and the S1 nerve root was identified as it exited out  underneath the pedicle.  It appeared the disk was down between S1-S2 and  the next interlaminar space up which was between L5-S1.  We had the  ligamentum removed.  At this point the lamina was thin and the  hemilaminectomy was completed.  There was fibrous tissue present with  the nerve root stuck down to the bulging  disk.  X-ray was taken with a  Penfield #4 just above the level of disk.  Skip Estimable was used to retract the  nerve root toward the midline, annulus was incised, and the large chunk  of disk was removed which immediately allowed the nerve root to be much  more free and a little bit further retraction toward the midline.  Passes were made up and down micro pituitaries, regular straight  pituitaries and up and down bite pituitaries.  This appeared paracentral  protrusion corresponding with the CT.  There still appeared to be some  mild compression closer to the midline from the remaining portion of the  lamina and this was removed, which allowed the nerve root to be  completely free.  Passes were made with the hockey stick on both the  axilla of the nerve root takeoff as well as over the shoulder of the  nerve root, out the foramina.  Bone was removed all the way out to the  pedicle.  There were no sharp lateral spikes,  nerve root was completely  free.  Passes were made anterior to the dura but no midline compression.  There was some spurring on S1.  Proximally there was epidural fat, it  was free, and no remaining ligament in the gutter.  After irrigation  with saline solution and final passes around the nerve root to make sure  everything was free, passes through the disk space, irrigation of the  disk space with saline solution.  The patient received preoperative  vancomycin and a time-out procedure was done at the beginning of the  procedure.  Operative microscope was removed and fascia closed with 0  Vicryl, 2-0 Vicryl in subcutaneous tissue.  Due to the thick adipose  layer it was felt subcuticular closure with thin skin was not going to  give an adequate closure, so skin staple was used.  Postop dressing,  Marcaine infiltration, and transferred to the recovery room.  Instrument  count and needle count were correct.      Mark C. Ophelia Charter, M.D.  Electronically Signed     MCY/MEDQ  D:   06/25/2006  T:  06/26/2006  Job:  [id number]

## 2010-06-29 NOTE — Op Note (Signed)
Lori York, Lori York             ACCOUNT NO.:  [id number]   MEDICAL RECORD NO.:  [id number]          PATIENT TYPE:  AMB   LOCATION:  SDC                           FACILITY:  [facility]   PHYSICIAN:  Lenoard Aden, M.D.DATE OF BIRTH:  December 05, 1945   DATE OF PROCEDURE:  07/31/2004  DATE OF DISCHARGE:                                 OPERATIVE REPORT   PREOPERATIVE DIAGNOSIS:  Vulvodynia.   POSTOPERATIVE DIAGNOSES:  1.  Vulvodynia.  2.  Multiple labial cysts.   PROCEDURE:  Excision of multiple labial cysts.   SURGEON:  Lenoard Aden, M.D.   ANESTHESIA:  MAC and local.   ESTIMATED BLOOD LOSS:  About 50 mL.   COMPLICATIONS:  None.   DRAINS:  None.   SPECIMENS:  None.   The patient to recovery in good condition.   BRIEF OPERATIVE NOTE:  After being apprised of the risks of anesthesia,  infection, inability to cure pain, inability to prevent recurrence of these  labial cysts, the patient was brought to the operating room, where she is  administered IV sedation without difficulty, prepped and draped in the usual  sterile fashion.  At this time each of the six noted labial cysts along the  right and left labia minora are infiltrated using a dilute 0.5% Marcaine  solution.  These cysts are then excised in an elliptical fashion with  extrusion of a sebaceous-type substance.  This is performed on all six  cysts, which are then closed using 3-0 Vicryl suture in an interrupted  fashion.  Good hemostasis is noted.  No further cyst formation is noted.  The patient tolerates the procedure well, is awakened and transferred to  recovery in good condition.       RJT/MEDQ  D:  07/31/2004  T:  07/31/2004  Job:  [id number]

## 2010-06-29 NOTE — Procedures (Signed)
[place]  Patient:    Lori York, Lori York Visit Number: [id number] MRN: [id number]          Service Type: CAT Location: [facility] [id number] Attending Physician:  Lewayne Bunting Dictated by:   Doylene Canning. Ladona Ridgel, M.D. [facility] Proc. Date: 06/03/01 Admit Date:  06/03/2001 Discharge Date: 06/03/2001   CC:         Lenise Herald, M.D.  Kathrine Cords   Procedure Report  PROCEDURE:  Implantable cardioverter-defibrillator generator revision.  INDICATION:  Persistent pain at the ICD generator site secondary to a residual pacing lead pushing anteriorly toward the skin surface.  INTRODUCTION:  The patient is a very pleasant 65 year old woman with a history of long QT syndrome, with a strong family history for sudden cardiac death. She recently had an upgrade from a dual-chamber pacemaker to defibrillator approximately two months ago.  Postoperatively she has complained of pain at the incision site which did not resolve.  She was subsequently found on chest x-ray to have a pacemaker lead which had previously been capped, which had migrated and was pointing anteriorly toward the skin surface.  This area was quite tender and because of persistent discomfort, she is now referred for ICD pocket revision.  DESCRIPTION OF PROCEDURE:  After informed consent was obtained, the patient was taken to the diagnostic EP lab in a fasted state.  After the usual preparation and draping, intravenous fentanyl and midazolam were given for sedation.  A total of 30 cc of lidocaine was infiltrated over the previous incision and a 7 cm incision was carried out over this region and electrocautery utilized to dissect down to the anteriorly-displaced, capped pacing lead.  The lead was subsequently freed up from its fibrous adhesions and additional dissection was carried out down to the subpectoralis fascia. The residual pacing lead was subsequently sutured with silk suture down to  the subpectoralis fascia.  At this point kanamycin was utilized to irrigate the incision and the incision was closed with a layer of 2-0 Vicryl, followed by a layer of 3-0 Vicryl, followed by a layer of 4-0 Vicryl.  Benzoin was painted on the skin and Steri-Strips were applied and a pressure dressing placed, and the patient returned to her room in satisfactory condition.  COMPLICATIONS:  There were no immediate procedural complications.  RESULTS:  This demonstrates successful ICD pocket revision in a patient with chronic pain following upgrade of her pacemaker to an ICD, with the pain being caused by anterior migration of the residual pacing lead. Dictated by:   Doylene Canning. Ladona Ridgel, M.D. [facility] Attending Physician:  Lewayne Bunting DD:  06/03/01 TD:  06/03/01 Job: [id number]

## 2010-06-29 NOTE — Discharge Summary (Signed)
Lori York. [facility]  Patient:    Lori York, Lori York Visit Number: [id number] MRN: [id number]          Service Type: DSU Location: [phone] Attending Physician:  Darlin Priestly Dictated by:   Abelino Derrick, P.A.C. Admit Date:  03/30/2001 Disc. Date: 04/01/01   CC:         Lori York, M.D. [facility]   Discharge Summary  DISCHARGE DIAGNOSES: 1. Long QT syndrome, status post permanent pacemaker in 1997, admitted    now for pacer change and implantation of implantable    cardioverter-defibrillator because of end of life. 2. Hypertension. 3. Reflux. 4. History of hyperlipidemia.  HOSPITAL COURSE:  [name] is a 65 year old female who had a pacemaker implanted in [place] in May 1997 for QT syndrome.  She moved here in September 2002.  She saw Dr. Jenne Campus March 27, 2001.  She apparently had not had her pacemaker checked in some time.  Full pacemaker interrogation was done.  She had a Guidant CPI model.  The patient was felt to be in end of life of her pacemaker and was set up for pacer change and EP consult.  The patient was seen by Dr. Sharrell Ku.  She has a strong family history of sudden cardiac death and Dr. Ladona York felt the ICD implant was warranted.  The patient underwent a pacing ICD implantation March 31, 2001, with explantation of the old pacemaker.  The new pacemaker is a [employer] AICD model [id number]. She tolerated the procedure well and was for discharge late on the 19th.  Her chest x-ray is pending at the time of dictation, but will be followed up and she will be sent home late today if that is alright.  DISCHARGE MEDICATIONS: 1. Clonidine 0.2 mg b.i.d. 2. Norvasc 10 mg a day. 3. Paxil 20 mg a day. 4. Nexium 40 mg a day. 5. Toprol XL 100 mg twice a day. 6. Ambien 5-10 mg if needed for sleep.  LABORATORIES:  Sodium 141, potassium 3.3 on the 18th, BUN 7, creatinine 0.9, INR 1.4, white count 5.1, hemoglobin 14, hematocrit  41.5, platelets 282. Lipid profile shows a HDL of 26, LDL was not calculated due to high triglycerides.  Chest x-ray shows permanent pacemaker, no active disease on the 17th.  EKG shows VVI pacing; she is discharged in sinus rhythm at 75.  PLAN:  The patient will follow up with Dr. Jenne Campus, Wednesday, March 5, at 10:15 a.m.  She has been given instructions on her activity and not to get the site wet for seven days. Dictated by:   Abelino Derrick, P.A.C. Attending Physician:  Darlin Priestly DD:  04/01/01 TD:  04/02/01 Job: [id number]

## 2010-06-29 NOTE — H&P (Signed)
NAMELAMIS, BEHRMANN NO.:  [id number]   MEDICAL RECORD NO.:  [id number]           PATIENT TYPE:   LOCATION:                                 FACILITY:   PHYSICIAN:  Lenoard Aden, M.D.     DATE OF BIRTH:   DATE OF ADMISSION:  DATE OF DISCHARGE:                                HISTORY & PHYSICAL   CHIEF COMPLAINT:  Vulvodynia.   HISTORY OF PRESENT ILLNESS:  The patient is a 65 year old African American  female with recurrent vulvar painful cyst and questionable sebaceous cyst  for incision and drainage and definitive management.  She has an obstetric  history remarkable for an uncomplicated vaginal delivery.   ALLERGIES:  PENICILLIN.   MEDICATIONS:  Nexium, Norvasc, Zocor, Arthrotec, Lyrica, Paxil, clonidine,  Ambien, glipizide, and amitriptyline.   PAST MEDICAL HISTORY:  1.  Reflux.  2.  Hypertension.  3.  Hypercholesterolemia.  4.  Cardiovascular disease.  5.  Depression.  6.  Adult onset diabetes mellitus.  7.  Of note, she also has a chronic defibrillator per patient report.   FAMILY HISTORY:  Diabetes.   PHYSICAL EXAMINATION:  GENERAL:  This is a well-developed, well-nourished  [place] female in no acute distress.  HEENT:  Normal.  LUNGS:  Clear.  HEART:  Regular rate and rhythm.  ABDOMEN:  Soft, nontender.  PELVIC:  Multiple bilateral labia minora and labia majora, sebaceous-type  cysts which are painful to palpation.  Pelvic exam reveals a well-supported  vaginal apex and no pelvic masses.  Uterus is previously absent.  Hysterectomy done approximately 20 years ago for fibroids.  EXTREMITIES:  No __________ .  NEUROLOGIC:  Nonfocal.   IMPRESSION:  Vulvodynia with secondary multiple vulvar cysts.   PLAN:  Proceed with incision and drainage of vulvar cysts.  Risks of  anesthesia, infection, bleeding, discussed with the patient. She  acknowledges and wishes to proceed.       RJT/MEDQ  D:  07/30/2004  T:  07/31/2004  Job:   [id number]

## 2010-06-29 NOTE — Consult Note (Signed)
[place]. [facility]  Patient:    Lori York, Lori York Visit Number: [id number] MRN: [id number]          Service Type: DSU Location: [phone] Attending Physician:  Lori York Dictated by:   Lori York. Lori York, M.D. [facility] Admit Date:  03/30/2001                            Consultation Report  REFERRING PHYSICIAN:  Lenise York, M.D.  REQUEST FOR CONSULTATION:  Evaluation of long QT syndrome.  HISTORY OF PRESENT ILLNESS:  The patient is a very pleasant 65 year old woman, with a history of hypertension and hyperlipidemia who had a strong family history of sudden cardiac death and long QT syndrome who was admitted to the hospital for pacemaker generator change. The patients history dates back to 1997 when she underwent implantation of a dual chamber pacemaker with concomitant beta blocker therapy for treatment of long QT syndrome. The patient has multiple family members who have died suddenly. These include her mother who died in her 10s suddenly, followed by her uncle who died in his 18s suddenly, followed by a sister who died in her 71s, a brother who died at age 65, and a daughter who was in her 28s, with all of these dying suddenly with no clear cut reason. Incidentally, two of these episodes occurred while they were using the bathroom. The patient has recently moved from [place] to [place] and was initially seen by Dr. Jenne York, and subsequently found to have a pacemaker at [facility] and was admitted for a pacemaker generator change. Because of the concerning prolongation of the QT interval and a strong family history, she is referred for additional evaluation.  PAST MEDICAL HISTORY:  As previously noted.  FAMILY HISTORY:  As previously noted. Her father was noted to die at age 59 of cirrhosis.  SOCIAL HISTORY:  The patient is married and lives in [place], works as a Tree surgeon. She rarely smokes cigarettes, one quarter pack per day and  denies any ethanol or recreational drug use.  PAST SURGICAL HISTORY:  Conclude a transabdominal hysterectomy and a hemorrhoidectomy in 1986 and 1975 respectively. She had a pacemaker placed in 1997 (guide vigor).  MEDICATIONS:  Toprol XL 100 mg twice a day, Norvasc 10 mg a day, Paxil 20 mg a day, Ambien, clonidine 0.2 three times a day, and Nexium 40 mg a day.  REVIEW OF SYSTEMS:  Is notable for rare night sweats. She denies any weight change or adenopathy, or fevers or chills. She denies any headache or vertigo, or vision or hearing changes. She denies any skin changes. She does have shortness of breath, orthopnea, and PND, and does have a history of presyncope associated with palpitations. She does not have any arthritic symptoms. She denies any urinary urgency or dysuria. She denies any weakness or numbness. She does not complain of chronic anxiety. She denies any nausea, vomiting, diarrhea or constipation, or abdominal pain. She denies any polyuria or polydipsia. She denies any heat or cold intolerance.  PHYSICAL EXAMINATION:  GENERAL:  She is a pleasant well-appearing obese middle aged woman in no distress.  VITAL SIGNS: Blood pressure 120/64, pulse 70 and regular, respirations 18-20, temperature 98.1, and the weight was 219.  HEENT:  Normocephalic and atraumatic. Pupils equal and round. Oropharynx moist.  NECK:  No jugular venous distention. The carotid upstrokes were normal.  CARDIOVASCULAR:  Regular rate and rhythm. Normal S1 and  S2. There was no murmurs, rubs, or gallops.  LUNGS:  Clear bilaterally with a rare basilar rale.  ABDOMEN:  Soft, nontender. Nondistended. There is no organomegaly.  EXTREMITIES:  Demonstrated no sign of cyanosis, clubbing, or edema.  NEUROLOGICAL:  Alert and oriented x 3 with the cranial nerves grossly intact.  LABORATORY DATA:  EKG demonstrated normal sinus rhythm with a calculated QTc of approximately 530 msec. Review of her telemetry  reveals QTc intervals of greater than 600 msec.  IMPRESSION: 1. 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 2. Status post permanent pacemaker implantation secondary to number 1, with    the pacemaker generator at [facility]. 3. Hypertension. 4. Hyperlipidemia.  DISCUSSION:  The treatment for Mrs. Lori York is difficult. While she has had no frank syncope, no aborted sudden cardiac death, her family history is very strong for sudden cardiac death and no preceding warnings syncope. Her QT interval clearly changes, and I am quite concerned that with the strong family history of sudden death and the prolongation of the QT, that she in fact has a very malignant gene and her risk of sudden death is significant, and for this reason I would recommend empiric ICD implantation rather than a pacemaker generator change for a long term treatment of this patient.  I discussed the treatment options with the patient, their risks and benefits in detail, and she wishes to proceed with ICD implantation. This will be scheduled at the earliest possible time. Dictated by:   Lori York. Lori York, M.D. [facility] Attending Physician:  Lori York DD:  03/30/01 TD:  03/30/01 Job: [id number]

## 2010-06-29 NOTE — Discharge Summary (Signed)
Lori York, Lori York             ACCOUNT NO.:  [id number]   MEDICAL RECORD NO.:  [id number]          PATIENT TYPE:  INP   LOCATION:  5035                         FACILITY:  [facility]   PHYSICIAN:  Mark C. Ophelia Charter, M.D.    DATE OF BIRTH:  1946-02-09   DATE OF ADMISSION:  11/20/2007  DATE OF DISCHARGE:  11/24/2007                               DISCHARGE SUMMARY   ADMISSION DIAGNOSES:  1. 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  2. Diabetes mellitus.  3. Gastroesophageal reflux disease.  4. Coronary artery disease.  5. Hypertension.  6. Glaucoma.  7. Implanted defibrillator for the ventricular tachycardia.  8. Sleep apnea symptoms with no formal evaluation.   DISCHARGE DIAGNOSES:  1. 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  2. Diabetes mellitus.  3. Gastroesophageal reflux disease.  4. Coronary artery disease.  5. Hypertension.  6. Glaucoma.  7. Implanted defibrillator for the ventricular tachycardia.  8. Sleep apnea symptoms with no formal evaluation.  9. Posthemorrhagic anemia.   PROCEDURES:  On November 12, 2007, the patient underwent;  1. L5-S1 right transforaminal lumbar interbody fusion with removal of      recurrent herniated nucleus pulposus and microdiskectomy.  2. Central decompression L4-5.  3. Bilateral pedicle instrumentation with PEEK cages, local interbody      bone, and bilateral transverse process fusion.  This was performed      by Dr. Ophelia Charter, assisted by Maud Deed, PA, under general      anesthesia.   CONSULTATIONS:  None.   BRIEF HISTORY:  The patient is a 65 year old female who is status post  L5-S1 microdiskectomy.  This was in May 2008.  She has had recurrence of  her low back pain and inability to ambulate distances.  CT scan  demonstrated recurrent herniated nucleus pulposus at L5-S1 with a 2-8 mm  change with flexion/extension views of the  L5-S1 level.  Due to her  instability and back pain secondary to her recurrence of herniated  nucleus pulposus as well as degenerative disk disease, it was felt that  she would benefit from surgical intervention and was admitted for the  procedure as stated above.   BRIEF HOSPITAL COURSE:  Prior to surgery, the patient was seen by her  cardiologist, Dr. Johney Frame, who felt the patient was at low risk for  surgery.  She was admitted for the procedure as stated above.  She  tolerated this under general anesthesia without complications.  Postoperatively, neurovascular motor function of the lower extremities  remained intact.  Defibrillator was cared for by the representative from  [employer].  The defibrillator was turned back on after surgical  intervention.  The patient was placed on the orthopedic floor.  There,  she was fitted with an Aspen LSO.  Physical therapy was initiated for  ambulation and gait training.  The patient was slow to progress with  physical therapy.  She ambulated only short distances the first day or  so in the hospital secondary to severe pain.  Narcotic analgesics were  utilized via PCA pump and she was weaned to p.o. analgesics gradually  during the hospital stay.  Postoperatively, hemoglobin and hematocrit  decreased to the lowest value of 9.4 and 28.3 respectively.  Chemistry  studies on admission with potassium 3.4, values otherwise normal.  Repeat chemistry postoperatively with normal values except creatinine  1.24 and glucose 140.  Blood sugars were checked routinely and sliding  scale insulin utilized until the patient returned to her oral  medications, which was on the second postoperative day.  Prior to  discharge, the patient was able to have bowel movement.  She was voiding  well following discontinuation of her Foley catheter.  The patient was  placed on a low-carbohydrate diet.  Prior to discharge, she was  ambulating as much as 140 feet utilizing  a rolling walker with physical  therapy.  It was felt she was stable for discharge to her home.  During  the hospital stay, her wound was found to be dry and clean without signs  of drainage.  Dressing changes were done daily.   PLAN:  The patient was discharged to her home with arrangements for home  health physical therapy as well as durable medical equipment.  She will  wear her brace at all times when out of bed.  She will keep her incision  dry and clean other than for showering purposes.  She will follow up  with Dr. Ophelia Charter in 1 week.  The patient was given prescription for  Percocet 1 every 6 hours as needed for pain and advised to resume her  home medications as taken prior to admission.  She was given medication  reconciliation form with these instructions.  She will continue to  ambulate weightbearing as tolerated.  If she has questions or concerns  prior to her return office visit, she was advised to call Dr. Ophelia Charter  office.   CONDITION ON DISCHARGE:  Stable.      Wende Neighbors, P.A.      Mark C. Ophelia Charter, M.D.  Electronically Signed    SMV/MEDQ  D:  12/24/2007  T:  12/24/2007  Job:  [id number]

## 2010-06-29 NOTE — Discharge Summary (Signed)
[place]  Patient:    CARMELLIA, KREISLER Visit Number: [id number] MRN: [id number]          Service Type: DSU Location: [phone] Attending Physician:  Darlin Priestly Dictated by:   Abelino Derrick, P.A.C. Admit Date:  03/30/2001                             Discharge Summary  ADDENDUM  Ms. Alfords follow-up chest x-ray postpacemaker showed no pneumothorax and clear lungs. Dictated by:   Abelino Derrick, P.A.C. Attending Physician:  Darlin Priestly DD:  04/01/01 TD:  04/02/01 Job: [id number]

## 2010-08-13 ENCOUNTER — Encounter: Payer: Self-pay | Admitting: Internal Medicine

## 2010-11-12 LAB — URINALYSIS, ROUTINE W REFLEX MICROSCOPIC
Glucose, UA: NEGATIVE
Hgb urine dipstick: NEGATIVE
Protein, ur: NEGATIVE
Specific Gravity, Urine: 1.025
Urobilinogen, UA: 1

## 2010-11-12 LAB — COMPREHENSIVE METABOLIC PANEL
AST: 16
BUN: 12
CO2: 25
CO2: 29
Calcium: 9.7
Chloride: 107
Creatinine, Ser: 0.71
Creatinine, Ser: 0.67
GFR calc non Af Amer: >60
GFR calc non Af Amer: >60
Potassium: 3.9
Total Bilirubin: 0.7
Total Bilirubin: 0.3
Total Protein: 6.3

## 2010-11-12 LAB — DIFFERENTIAL
Basophils Absolute: 0
Basophils Absolute: 0
Basophils Relative: 1
Eosinophils Absolute: 0.1
Eosinophils Relative: 1
Lymphocytes Relative: 28
Lymphocytes Relative: 25
Lymphs Abs: 1.7
Neutro Abs: 5.1
Neutrophils Relative %: 67

## 2010-11-12 LAB — CBC
HCT: 42.1
Hemoglobin: 13.8
MCHC: 34
MCHC: 33.5
MCV: 89
RBC: 4.61
RBC: 4.73
WBC: 7.6

## 2010-11-12 LAB — PROTIME-INR
INR: 0.9
Prothrombin Time: 12.9
Prothrombin Time: 12.5

## 2010-11-12 LAB — ABO/RH: ABO/RH(D): O POS

## 2010-11-12 LAB — GLUCOSE, CAPILLARY
Glucose-Capillary: 115 — ABNORMAL HIGH
Glucose-Capillary: 115 — ABNORMAL HIGH
Glucose-Capillary: 123 — ABNORMAL HIGH
Glucose-Capillary: 138 — ABNORMAL HIGH
Glucose-Capillary: 176 — ABNORMAL HIGH
Glucose-Capillary: 74
Glucose-Capillary: 81
Glucose-Capillary: 99

## 2010-11-12 LAB — BASIC METABOLIC PANEL
BUN: 11
CO2: 26
Chloride: 105
Potassium: 3.6

## 2010-11-12 LAB — HEMOGLOBIN AND HEMATOCRIT, BLOOD
Hemoglobin: 10.7 — ABNORMAL LOW
Hemoglobin: 9.4 — ABNORMAL LOW

## 2010-11-12 LAB — APTT: aPTT: 28

## 2010-11-12 LAB — TYPE AND SCREEN
ABO/RH(D): O POS
Antibody Screen: NEGATIVE

## 2010-12-20 ENCOUNTER — Ambulatory Visit (INDEPENDENT_AMBULATORY_CARE_PROVIDER_SITE_OTHER): Payer: Medicare Other | Admitting: Internal Medicine

## 2010-12-20 ENCOUNTER — Encounter: Payer: Self-pay | Admitting: Internal Medicine

## 2010-12-20 DIAGNOSIS — I4581 Long QT syndrome: Secondary | ICD-10-CM

## 2010-12-20 DIAGNOSIS — I10 Essential (primary) hypertension: Secondary | ICD-10-CM

## 2010-12-20 DIAGNOSIS — F17200 Nicotine dependence, unspecified, uncomplicated: Secondary | ICD-10-CM

## 2010-12-20 DIAGNOSIS — R0602 Shortness of breath: Secondary | ICD-10-CM | POA: Insufficient documentation

## 2010-12-20 LAB — ICD DEVICE OBSERVATION
DEVICE MODEL ICD: 754557
FVT: 0
PACEART VT: 0
RV LEAD THRESHOLD: 0.75 V
TOT-0007: 1
TOT-0008: 0
VENTRICULAR PACING ICD: 0 pct

## 2010-12-20 LAB — CBC WITH DIFFERENTIAL/PLATELET
Basophils Absolute: 0 10*3/uL (ref 0.0–0.1)
Basophils Relative: 0.6 % (ref 0.0–3.0)
Eosinophils Absolute: 0 10*3/uL (ref 0.0–0.7)
Lymphocytes Relative: 23.7 % (ref 12.0–46.0)
MCHC: 32.7 g/dL (ref 30.0–36.0)
Neutrophils Relative %: 67.3 % (ref 43.0–77.0)
RBC: 4.45 Mil/uL (ref 3.87–5.11)

## 2010-12-20 LAB — BASIC METABOLIC PANEL
BUN: 18 mg/dL (ref 6–23)
GFR: 74.56 mL/min (ref >60.00)
Potassium: 4.1 mEq/L (ref 3.5–5.1)
Sodium: 143 mEq/L (ref 135–145)

## 2010-12-20 LAB — MAGNESIUM: Magnesium: 2.1 mg/dL (ref 1.5–2.5)

## 2010-12-20 MED ORDER — FUROSEMIDE 40 MG PO TABS: 40.0000 mg | ORAL_TABLET | Freq: Every day | ORAL | Status: DC

## 2010-12-20 MED ORDER — CARVEDILOL 6.25 MG PO TABS: 6.2500 mg | ORAL_TABLET | Freq: Two times a day (BID) | ORAL | Status: DC

## 2010-12-20 NOTE — Assessment & Plan Note (Signed)
ekg reveals stable but prominent long QT Normal ICD function today See Pace Art report No changes today The importance of avoidance of QT prolonging drugs was stressed today We will add a beta blocker (previously on nadolol but no longer taking). Given her prolonged QT, she will require beta blockers long term.

## 2010-12-20 NOTE — Progress Notes (Signed)
The patient presents today for walkin add on appointment.  She has been noncompliant with follow-up of her ICD, visits to our office, and medications.  She reports that over the past 3 days, she has had a nonproductive cough.  This cough is worse at night and associated with orthopnea/ PND.  She denies fevers, chills, or dypsnea during the day.  Today, she denies symptoms of palpitations, chest pain, lower extremity edema, dizziness, presyncope, syncope, or neurologic sequela.  The patient feels that she is tolerating medications without difficulties and is otherwise without complaint today.  She has taken none of her medications today.  Past Medical History  Diagnosis Date  . 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 HTN (hypertension)   . Diabetes mellitus   . Glaucoma   . Noncompliance    Past Surgical History  Procedure Date  . Cardiac defibrillator placement     in 1997 with revision to a  Guidant model 1860 single chamber implantable cardioverter- defib on Feb18, 2003, most recent generator change 2/11 by Dr Johney Frame S[facility]  . Laminectomy May 2008  . Right knee resconstructuion 1970  . Laminectomy 11/20/07     LF5-S1 fusion    Current Outpatient Prescriptions  Medication Sig Dispense Refill  . atorvastatin (LIPITOR) 20 MG tablet Take 20 mg by mouth daily.        . hydrALAZINE (APRESOLINE) 25 MG tablet Take 25 mg by mouth 3 (three) times daily.        Marland Kitchen losartan-hydrochlorothiazide (HYZAAR) 100-25 MG per tablet Take 1 tablet by mouth daily.        . metFORMIN (GLUCOPHAGE) 500 MG tablet Take 500 mg by mouth daily.        Marland Kitchen omeprazole (PRILOSEC) 40 MG capsule Take 40 mg by mouth daily.        Marland Kitchen PARoxetine (PAXIL) 20 MG tablet Take 20 mg by mouth daily.        Marland Kitchen zolpidem (AMBIEN) 10 MG tablet Take 10 mg by mouth at bedtime.          Allergies  Allergen Reactions  . Penicillins     History   Social History  . Marital Status: Widowed    Spouse Name: N/A    Number of  Children: N/A  . Years of Education: N/A   Occupational History  . Not on file.   Social History Main Topics  . Smoking status: Current Everyday Smoker -- 0.3 packs/day    Types: Cigarettes  . Smokeless tobacco: Never Used   Comment: Smokes less thatn 1/2 pack per day and has no interest in [place] t this time.   . Alcohol Use: No  . Drug Use: No  . Sexually Active: Not on file   Other Topics Concern  . Not on file   Social History Narrative   Lives in [place] with her adopted son who is 70 and a god brother, who is 1. 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     Family History  Problem Relation Age of Onset  . Long QT syndrome Mother   . Long QT syndrome Sister   . Long QT syndrome Brother   . Long QT syndrome Daughter     ROS-  All systems are reviewed and are negative except as outlined in the HPI above   Physical Exam: Filed Vitals:   12/20/10 1114  BP: 166/106  Pulse: 105  Height: 5\' 4"  (1.626 m)  Weight: 188 lb (85.276 kg)    GEN- The patient is overweight but well appearing, alert and oriented x 3 today.  NAD Head- normocephalic, atraumatic Eyes-  Sclera clear, conjunctiva pink Ears- hearing intact Oropharynx- clear Neck- supple, JVP 11cm Lymph- no cervical lymphadenopathy Lungs- decreased BS at R base, otherwise clear, normal work of breathing Chest- ICD pocket is well healed Heart- tachycardic regular rhythm, no murmurs, rubs or gallops, PMI not laterally displaced GI- soft, NT, ND, + BS Extremities- no clubbing, cyanosis, or edema MS- no significant deformity or atrophy Skin- no rash or lesion Psych- euthymic mood, full affect Neuro- strength and sensation are intact  ICD interrogation- reviewed in detail today,  See PACEART report  Assessment and Plan:

## 2010-12-20 NOTE — Assessment & Plan Note (Signed)
Above goal Add coreg Compliance encouraged bmet today

## 2010-12-20 NOTE — Assessment & Plan Note (Signed)
Cessation advised. 

## 2010-12-20 NOTE — Patient Instructions (Addendum)
Your physician recommends that you schedule a follow-up appointment in: 1 week with Lori York and in 3 months with Dr Johney Frame  A chest x-ray takes a picture of the organs and structures inside the chest, including the heart, lungs, and blood vessels. This test can show several things, including, whether the heart is enlarges; whether fluid is building up in the lungs; and whether pacemaker / defibrillator leads are still in place.  Your physician recommends that you return for lab work today   Your physician has recommended you make the following change in your medication:  1)Start Carvedilol 6.25mg  one twice daily 2) start Furosemide 40mg  daily   Your physician has requested that you have an echocardiogram. Echocardiography is a painless test that uses sound waves to create images of your heart. It provides your doctor with information about the size and shape of your heart and how well your heart's chambers and valves are working. This procedure takes approximately one hour. There are no restrictions for this procedure.

## 2010-12-20 NOTE — Assessment & Plan Note (Signed)
The patient presents with cough and orthopnea x 3 days.  She denies chest pain or SOB during the day.  She appears volume overloaded on exam.  She may have diastolic dysfunction acutely related to poorly controlled hypertension.  Pneumonia would be less likely.  Given absence of symptoms during the day, I think that PTE is very unlikely. Presently, she is clinically stable and will be managed as an outpatient.  She is aware that should her SOB worsen, she develop chest pain, or any other concerns that she should go to the ER immediatley.  We will obtain CBC, BMET, PA/Lateral CXR, and an echo. I will start lasix 40mg  daily for volume overload over the next few days.  She will follow-up in our office next week. Pending results of other studies, we will determine if she will require lasix long term.  She is presently also on HCTZ.  Tobacco cessation also advised

## 2010-12-21 ENCOUNTER — Ambulatory Visit (INDEPENDENT_AMBULATORY_CARE_PROVIDER_SITE_OTHER)
Admission: RE | Admit: 2010-12-21 | Discharge: 2010-12-21 | Disposition: A | Discharge status: Home / Self Care | Payer: Medicare Other | Source: Ambulatory Visit | Attending: Internal Medicine | Admitting: Internal Medicine

## 2010-12-21 DIAGNOSIS — F17200 Nicotine dependence, unspecified, uncomplicated: Secondary | ICD-10-CM

## 2010-12-21 DIAGNOSIS — R0602 Shortness of breath: Secondary | ICD-10-CM

## 2010-12-27 ENCOUNTER — Ambulatory Visit (INDEPENDENT_AMBULATORY_CARE_PROVIDER_SITE_OTHER): Payer: Medicare Other | Admitting: Physician Assistant

## 2010-12-27 ENCOUNTER — Encounter: Payer: Self-pay | Admitting: Physician Assistant

## 2010-12-27 ENCOUNTER — Ambulatory Visit (HOSPITAL_COMMUNITY): Payer: Medicare Other | Attending: Cardiovascular Disease | Admitting: Radiology

## 2010-12-27 DIAGNOSIS — I059 Rheumatic mitral valve disease, unspecified: Secondary | ICD-10-CM | POA: Insufficient documentation

## 2010-12-27 DIAGNOSIS — I379 Nonrheumatic pulmonary valve disorder, unspecified: Secondary | ICD-10-CM | POA: Insufficient documentation

## 2010-12-27 DIAGNOSIS — I5022 Chronic systolic (congestive) heart failure: Secondary | ICD-10-CM | POA: Insufficient documentation

## 2010-12-27 DIAGNOSIS — I10 Essential (primary) hypertension: Secondary | ICD-10-CM | POA: Insufficient documentation

## 2010-12-27 DIAGNOSIS — I5032 Chronic diastolic (congestive) heart failure: Secondary | ICD-10-CM

## 2010-12-27 DIAGNOSIS — E119 Type 2 diabetes mellitus without complications: Secondary | ICD-10-CM | POA: Insufficient documentation

## 2010-12-27 DIAGNOSIS — F17200 Nicotine dependence, unspecified, uncomplicated: Secondary | ICD-10-CM

## 2010-12-27 DIAGNOSIS — R0602 Shortness of breath: Secondary | ICD-10-CM | POA: Insufficient documentation

## 2010-12-27 DIAGNOSIS — I079 Rheumatic tricuspid valve disease, unspecified: Secondary | ICD-10-CM | POA: Insufficient documentation

## 2010-12-27 DIAGNOSIS — I429 Cardiomyopathy, unspecified: Secondary | ICD-10-CM

## 2010-12-27 DIAGNOSIS — E785 Hyperlipidemia, unspecified: Secondary | ICD-10-CM | POA: Insufficient documentation

## 2010-12-27 DIAGNOSIS — R0989 Other specified symptoms and signs involving the circulatory and respiratory systems: Secondary | ICD-10-CM

## 2010-12-27 DIAGNOSIS — I509 Heart failure, unspecified: Secondary | ICD-10-CM

## 2010-12-27 DIAGNOSIS — R0609 Other forms of dyspnea: Secondary | ICD-10-CM

## 2010-12-27 LAB — BASIC METABOLIC PANEL
GFR: 57.22 mL/min — ABNORMAL LOW (ref >60.00)
Glucose, Bld: 89 mg/dL (ref 70–99)
Potassium: 3.6 mEq/L (ref 3.5–5.1)
Sodium: 141 mEq/L (ref 135–145)

## 2010-12-27 MED ORDER — CARVEDILOL 12.5 MG PO TABS: 12.5000 mg | ORAL_TABLET | Freq: Two times a day (BID) | ORAL | Status: DC

## 2010-12-27 MED ORDER — HYDRALAZINE HCL 25 MG PO TABS: 50.0000 mg | ORAL_TABLET | Freq: Three times a day (TID) | ORAL | Status: DC

## 2010-12-27 MED ORDER — LOSARTAN POTASSIUM 100 MG PO TABS: 100.0000 mg | ORAL_TABLET | Freq: Every day | ORAL | Status: DC

## 2010-12-27 NOTE — Patient Instructions (Signed)
Your physician recommends that you schedule a follow-up appointment in: 2-3 WEEKS W/ SCOTT [facility]ER ON A DAY THAT DR. ALLRED IS IN THE OFFICE.  Your physician has recommended you make the following change in your medication:  STOP HYZAAR START COZAAR 100MG  DAILY INCREASE HYDRALAZINE TO 50MG  THREE TIMES PER DAY INCREASE COREG 12.5MG  TWICE DAILY   Your physician recommends that you return for lab work in:  TODAY:  BMET 428.32

## 2010-12-27 NOTE — Assessment & Plan Note (Signed)
Volume improved.  Weight down 9 lbs.  Symptoms resolved.  Stop HCTZ.  Resume Cozaar 100 mg QD.  Check BMET today.  Continue Lasix 40 mg QD.  Echo done today and is pending. If EF down, will need ischemic workup with PMH of DM2 and FHx of CAD.

## 2010-12-27 NOTE — Assessment & Plan Note (Signed)
Still uncontrolled.  Adjust meds as noted.  Increase hydralalzine to 50 mg TID.  Increase coreg to 12.5 mg bid.  Follow up with me in 2-3 weeks.

## 2010-12-27 NOTE — Progress Notes (Signed)
History of Present Illness: PCP:  Dr. Robyn Sanders Primary Electrophysiologist:  Dr. James Allred   Lori York is a 65 y.o. female who presents for follow up on diastolic CHF.  She has a h/o Long QT syndrome with a FHx of sudden cardiac death.  She is s/p AICD.  She has a h/o noncompliance with ICD follow up and medications.  She also has a h/o DM2, HTN, glaucoma.  Last echo 10/09: EF 55-60%, mild LVH, mild diastolic dysfunction, mildly increased PASP.  She was seen last week with cough and orthopnea.  BP was also extremely high.  She was started on coreg for BP control and Lasix 40 mg QD.  She presents for follow up.  Labs (12/20/10): K 4.1, creatinine 1.0, Mg 2.1, TSH 0.64, WBC 6.5, Hgb 13.3, CXR c/w early findings of CHF.  Feels much better.  Dyspnea is improved.  Reports class 2 symptoms.  No chest pain.  No syncope.  No palpitations.  No orthopnea, PND or edema.  Weight is down 9 lbs. No high salt diet.  Taking all meds.  Past Medical History  Diagnosis Date  . 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 HTN (hypertension)   . Diabetes mellitus   . Glaucoma   . Noncompliance     Current Outpatient Prescriptions  Medication Sig Dispense Refill  . atorvastatin (LIPITOR) 20 MG tablet Take 20 mg by mouth daily.        . carvedilol (COREG) 6.25 MG tablet Take 1 tablet (6.25 mg total) by mouth 2 (two) times daily.  60 tablet  11  . furosemide (LASIX) 40 MG tablet Take 1 tablet (40 mg total) by mouth daily.  30 tablet  11  . hydrALAZINE (APRESOLINE) 25 MG tablet Take 25 mg by mouth 3 (three) times daily.        . losartan-hydrochlorothiazide (HYZAAR) 100-25 MG per tablet Take 1 tablet by mouth daily.        . metFORMIN (GLUCOPHAGE) 500 MG tablet Take 500 mg by mouth daily.        . omeprazole (PRILOSEC) 40 MG capsule Take 40 mg by mouth daily.        . PARoxetine (PAXIL) 20 MG tablet Take 20 mg by mouth daily.        . zolpidem (AMBIEN) 10 MG tablet Take 10 mg by mouth  at bedtime.        . DISCONTD: furosemide (LASIX) 40 MG tablet Take 1 tablet (40 mg total) by mouth daily.  30 tablet  11    Allergies: Allergies  Allergen Reactions  . Penicillins     History  Substance Use Topics  . Smoking status: Current Everyday Smoker -- 0.3 packs/day    Types: Cigarettes  . Smokeless tobacco: Never Used   Comment: Smokes less thatn 1/2 pack per day and has no interest in quittinga t this time.   . Alcohol Use: No     Family History  Problem Relation Age of Onset  . Long QT syndrome Mother   . Long QT syndrome Sister   . Long QT syndrome Brother   . Long QT syndrome Daughter   . Coronary artery disease Sister 63     ROS:  Please see the history of present illness.   All other systems reviewed and negative.   Vital Signs: BP 155/100  Pulse 83  Resp 18  Ht 5' 5" (1.651 m)  Wt 179   lb 12.8 oz (81.557 kg)  BMI 29.92 kg/m2  PHYSICAL EXAM: Well nourished, well developed, in no acute distress HEENT: normal Neck: no JVD Cardiac:  normal S1, S2; RRR; no murmur; no S3 Lungs:  clear to auscultation bilaterally, no wheezing, rhonchi or rales Abd: soft, nontender, no hepatomegaly Ext: no edema Skin: warm and dry Neuro:  CNs 2-12 intact, no focal abnormalities noted  EKG:   NSR, HR 75, LVH, inf-lat TW inversions, long QT (QTc 605ms), no change from prior tracings.  ASSESSMENT AND PLAN:  

## 2010-12-27 NOTE — Assessment & Plan Note (Signed)
She is trying to quit. 

## 2010-12-28 ENCOUNTER — Encounter: Payer: Self-pay | Admitting: *Deleted

## 2010-12-28 ENCOUNTER — Other Ambulatory Visit: Payer: Self-pay | Admitting: Physician Assistant

## 2010-12-28 ENCOUNTER — Telehealth: Payer: Self-pay | Admitting: Internal Medicine

## 2010-12-28 DIAGNOSIS — R079 Chest pain, unspecified: Secondary | ICD-10-CM

## 2010-12-28 DIAGNOSIS — I429 Cardiomyopathy, unspecified: Secondary | ICD-10-CM | POA: Insufficient documentation

## 2010-12-28 NOTE — Telephone Encounter (Signed)
Follow up  ° ° °Patient returning call back to nurse  °

## 2010-12-28 NOTE — Assessment & Plan Note (Signed)
Echo returned last night with EF 20-25%.  Discussed with Dr. Hillis Range.  She needs left heart cath.  Will arrange LHC in the [facility] lab and notify patient and discuss risks and benefits.

## 2010-12-28 NOTE — Progress Notes (Signed)
Addended byAlben Spittle, Lorin Picket T on: 12/28/2010 02:15 PM   Modules accepted: Orders

## 2010-12-31 ENCOUNTER — Other Ambulatory Visit (INDEPENDENT_AMBULATORY_CARE_PROVIDER_SITE_OTHER): Payer: Medicare Other | Admitting: *Deleted

## 2010-12-31 DIAGNOSIS — R079 Chest pain, unspecified: Secondary | ICD-10-CM

## 2010-12-31 LAB — CBC WITH DIFFERENTIAL/PLATELET
Basophils Relative: 0.5 % (ref 0.0–3.0)
Eosinophils Absolute: 0 10*3/uL (ref 0.0–0.7)
Eosinophils Relative: 0.6 % (ref 0.0–5.0)
Lymphocytes Relative: 21.2 % (ref 12.0–46.0)
MCHC: 33.3 g/dL (ref 30.0–36.0)
Neutrophils Relative %: 68.4 % (ref 43.0–77.0)
RBC: 4.35 Mil/uL (ref 3.87–5.11)
WBC: 6.1 10*3/uL (ref 4.5–10.5)

## 2010-12-31 LAB — BASIC METABOLIC PANEL
CO2: 27 mEq/L (ref 19–32)
Calcium: 9.6 mg/dL (ref 8.4–10.5)
Sodium: 141 mEq/L (ref 135–145)

## 2010-12-31 LAB — PROTIME-INR: INR: 1 ratio (ref 0.8–1.0)

## 2010-12-31 NOTE — Telephone Encounter (Signed)
lmom for ptcb from pt's call @ 2:43 today. Danielle Rankin

## 2011-01-01 ENCOUNTER — Telehealth: Payer: Self-pay | Admitting: *Deleted

## 2011-01-01 DIAGNOSIS — I5032 Chronic diastolic (congestive) heart failure: Secondary | ICD-10-CM

## 2011-01-01 MED ORDER — POTASSIUM CHLORIDE CRYS ER 20 MEQ PO TBCR: EXTENDED_RELEASE_TABLET | ORAL | Status: DC

## 2011-01-01 NOTE — Telephone Encounter (Signed)
Lab order for repeat bmet. Danielle Rankin

## 2011-01-01 NOTE — Telephone Encounter (Signed)
RX SENT IN TODAY FOR KLOR CON. Danielle Rankin

## 2011-01-04 ENCOUNTER — Encounter (HOSPITAL_BASED_OUTPATIENT_CLINIC_OR_DEPARTMENT_OTHER)
Admission: RE | Disposition: A | Discharge status: Home / Self Care | Payer: Self-pay | Source: Ambulatory Visit | Attending: Cardiovascular Disease

## 2011-01-04 ENCOUNTER — Inpatient Hospital Stay (HOSPITAL_BASED_OUTPATIENT_CLINIC_OR_DEPARTMENT_OTHER)
Admission: RE | Admit: 2011-01-04 | Discharge: 2011-01-04 | Disposition: A | Discharge status: Home / Self Care | Payer: Medicare Other | Source: Ambulatory Visit | Attending: Cardiovascular Disease | Admitting: Cardiovascular Disease

## 2011-01-04 DIAGNOSIS — I714 Abdominal aortic aneurysm, without rupture, unspecified: Secondary | ICD-10-CM | POA: Insufficient documentation

## 2011-01-04 DIAGNOSIS — I4581 Long QT syndrome: Secondary | ICD-10-CM | POA: Insufficient documentation

## 2011-01-04 DIAGNOSIS — I2510 Atherosclerotic heart disease of native coronary artery without angina pectoris: Secondary | ICD-10-CM

## 2011-01-04 DIAGNOSIS — I509 Heart failure, unspecified: Secondary | ICD-10-CM | POA: Insufficient documentation

## 2011-01-04 DIAGNOSIS — E119 Type 2 diabetes mellitus without complications: Secondary | ICD-10-CM | POA: Insufficient documentation

## 2011-01-04 DIAGNOSIS — I429 Cardiomyopathy, unspecified: Secondary | ICD-10-CM

## 2011-01-04 DIAGNOSIS — R079 Chest pain, unspecified: Secondary | ICD-10-CM | POA: Insufficient documentation

## 2011-01-04 DIAGNOSIS — Z95810 Presence of automatic (implantable) cardiac defibrillator: Secondary | ICD-10-CM | POA: Insufficient documentation

## 2011-01-04 LAB — POCT I-STAT GLUCOSE: Operator id: 194801

## 2011-01-04 SURGERY — JV LEFT HEART CATHETERIZATION WITH CORONARY ANGIOGRAM
Anesthesia: Moderate Sedation

## 2011-01-04 MED ORDER — SODIUM CHLORIDE 0.9 % IV SOLN: INTRAVENOUS | Status: DC

## 2011-01-04 MED ORDER — CARVEDILOL 12.5 MG PO TABS
12.5000 mg | ORAL_TABLET | Freq: Once | ORAL | Status: AC
  Administered 2011-01-04: 12.5 mg via ORAL
  Filled 2011-01-04: qty 1

## 2011-01-04 MED ORDER — ASPIRIN 81 MG PO CHEW: 324.0000 mg | CHEWABLE_TABLET | ORAL | Status: DC

## 2011-01-04 MED ORDER — LOSARTAN POTASSIUM 50 MG PO TABS
100.0000 mg | ORAL_TABLET | Freq: Once | ORAL | Status: AC
  Administered 2011-01-04: 100 mg via ORAL
  Filled 2011-01-04: qty 2

## 2011-01-04 MED ORDER — HYDRALAZINE HCL 25 MG PO TABS
25.0000 mg | ORAL_TABLET | Freq: Once | ORAL | Status: AC
  Administered 2011-01-04: 25 mg via ORAL
  Filled 2011-01-04: qty 1

## 2011-01-04 NOTE — OR Nursing (Signed)
Tegaderm dressing applied, site intact, bedrest begins at 1105

## 2011-01-04 NOTE — H&P (View-Only) (Signed)
History of Present Illness: PCP:  Dr. Dorothyann Peng Primary Electrophysiologist:  Dr. Hillis Range   Lori York is a 65 y.o. female who presents for follow up on diastolic CHF.  She has a h/o Long QT syndrome with a FHx of sudden cardiac death.  She is s/p AICD.  She has a h/o noncompliance with ICD follow up and medications.  She also has a h/o DM2, HTN, glaucoma.  Last echo 10/09: EF 55-60%, mild LVH, mild diastolic dysfunction, mildly increased PASP.  She was seen last week with cough and orthopnea.  BP was also extremely high.  She was started on coreg for BP control and Lasix 40 mg QD.  She presents for follow up.  Labs (12/20/10): K 4.1, creatinine 1.0, Mg 2.1, TSH 0.64, WBC 6.5, Hgb 13.3, CXR c/w early findings of CHF.  Feels much better.  Dyspnea is improved.  Reports class 2 symptoms.  No chest pain.  No syncope.  No palpitations.  No orthopnea, PND or edema.  Weight is down 9 lbs. No high salt diet.  Taking all meds.  Past Medical History  Diagnosis Date  . 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 HTN (hypertension)   . Diabetes mellitus   . Glaucoma   . Noncompliance     Current Outpatient Prescriptions  Medication Sig Dispense Refill  . atorvastatin (LIPITOR) 20 MG tablet Take 20 mg by mouth daily.        . carvedilol (COREG) 6.25 MG tablet Take 1 tablet (6.25 mg total) by mouth 2 (two) times daily.  60 tablet  11  . furosemide (LASIX) 40 MG tablet Take 1 tablet (40 mg total) by mouth daily.  30 tablet  11  . hydrALAZINE (APRESOLINE) 25 MG tablet Take 25 mg by mouth 3 (three) times daily.        Marland Kitchen losartan-hydrochlorothiazide (HYZAAR) 100-25 MG per tablet Take 1 tablet by mouth daily.        . metFORMIN (GLUCOPHAGE) 500 MG tablet Take 500 mg by mouth daily.        Marland Kitchen omeprazole (PRILOSEC) 40 MG capsule Take 40 mg by mouth daily.        Marland Kitchen PARoxetine (PAXIL) 20 MG tablet Take 20 mg by mouth daily.        Marland Kitchen zolpidem (AMBIEN) 10 MG tablet Take 10 mg by mouth  at bedtime.        Marland Kitchen DISCONTD: furosemide (LASIX) 40 MG tablet Take 1 tablet (40 mg total) by mouth daily.  30 tablet  11    Allergies: Allergies  Allergen Reactions  . Penicillins     History  Substance Use Topics  . Smoking status: Current Everyday Smoker -- 0.3 packs/day    Types: Cigarettes  . Smokeless tobacco: Never Used   Comment: Smokes less thatn 1/2 pack per day and has no interest in [place] t this time.   . Alcohol Use: No     Family History  Problem Relation Age of Onset  . Long QT syndrome Mother   . Long QT syndrome Sister   . Long QT syndrome Brother   . Long QT syndrome Daughter   . Coronary artery disease Sister 73     ROS:  Please see the history of present illness.   All other systems reviewed and negative.   Vital Signs: BP 155/100  Pulse 83  Resp 18  Ht 5\' 5"  (1.651 m)  Wt 179  lb 12.8 oz (81.557 kg)  BMI 29.92 kg/m2  PHYSICAL EXAM: Well nourished, well developed, in no acute distress HEENT: normal Neck: no JVD Cardiac:  normal S1, S2; RRR; no murmur; no S3 Lungs:  clear to auscultation bilaterally, no wheezing, rhonchi or rales Abd: soft, nontender, no hepatomegaly Ext: no edema Skin: warm and dry Neuro:  CNs 2-12 intact, no focal abnormalities noted  EKG:   NSR, HR 75, LVH, inf-lat TW inversions, long QT (QTc ), no change from prior tracings.  ASSESSMENT AND PLAN:

## 2011-01-04 NOTE — OR Nursing (Signed)
Discharge instructions reviewed and signed, pt stated understanding. Site intact. Transported to son's car via wheelchair.

## 2011-01-04 NOTE — Op Note (Signed)
Cardiac Catheterization Procedure Note  Name: Lori York MRN: [id number] DOB: May 24, 1945  Procedure: Left Heart Cath, Selective Coronary Angiography, LV angiography  Indication: 65 year old woman with diabetes and long QT syndrome. She is status post remote ICD placement. She presented with symptoms of congestive heart failure and was noted to have new LV dysfunction. She was referred for diagnostic cardiac cath to rule out obstructive CAD.   Procedural details: The right groin was prepped, draped, and anesthetized with 1% lidocaine. Using modified Seldinger technique, a 5 French sheath was introduced into the right femoral artery. Standard Judkins catheters were used for coronary angiography and left ventriculography.  It was difficult to pass a wire through the aortoiliac transition. Because of that an abdominal aortogram was performed at the completion of the procedure. This demonstrated an abdominal aortic aneurysm. Catheter exchanges were performed over an exchange length guidewire. There were no immediate procedural complications. The patient was transferred to the post catheterization recovery area for further monitoring.  Procedural Findings: Hemodynamics:  AO 157/32 LV 159/88   Coronary angiography: Coronary dominance: right  Left mainstem: The left main stem is patent. There is mild calcification. There is 20% distal left main stenosis before the bifurcation of the LAD and left circumflex.  Left anterior descending (LAD): The LAD is a large caliber vessel that extends to the left ventricular apex. The vessel is of moderate caliber the midportion. There is moderate calcification present. The proximal LAD segment just beyond the first septal perforator has a 90% stenosis as it supplies the first diagonal branch. The first diagonal branch has 80% ostial stenosis. The stenotic region is moderately to heavily calcified. The vessel beyond the severe stenosis has mild poststenotic  dilatation. The remaining portions of LAD had no significant obstructive disease.  Left circumflex (LCx): The left circumflex is patent. There is mild ostial stenosis present. There are 3 small obtuse marginal branches without significant stenosis.  Right coronary artery (RCA): The right coronary artery is dominant. There is a long segment of moderately severe stenosis throughout the proximal to mid RCA transition. The vessel appears tented up as it supplies the first RV branch. There is 80% stenosis throughout this region. Distally the vessel is smooth without significant stenosis. There is a PDA and a posterolateral branch both of which are patent.  Left ventriculography: There is segmental LV dysfunction. The mid and distal inferior wall are severely hypokinetic. The apex is mildly hypokinetic. The anterior wall is mildly hypokinetic. The estimated left ventricular ejection fraction is 35-40%.  Abdominal aortic angiography: There are single renal arteries bilaterally. The right renal artery is patent. The left renal artery has 80% proximal stenosis but it is poorly visualized as the catheter was distal to the left renal artery origin. There is an infrarenal abdominal aortic aneurysm and marked tortuosity of the proximal iliac arteries is present. There does not appear to be significant iliac stenosis but there is proximal right common iliac aneurysmal formation.  Final Conclusions:   1. Severe proximal LAD stenosis 2. Severe mid right coronary artery stenosis 3. Nonobstructive left circumflex stenosis 4. Moderately severe segmental left ventricular systolic dysfunction 5. Infrarenal abdominal aortic aneurysm  Recommendations:  I discussed the procedural findings with the patient's primary cardiologist, Dr. Johney Frame. She has severe two-vessel coronary artery disease with significant left ventricular systolic dysfunction. The patient is diabetic and has been poorly compliant with followup. Her  anatomy is amenable to percutaneous stenting, but her most definitive treatment especially considering her history of noncompliance  his probably coronary bypass surgery. She needs better sizing of her abdominal aortic aneurysm, but I suspect it is less than 5 cm. An abdominal aortic ultrasound will be ordered to assess her aortoiliac. We'll discuss the findings with the patient and family and make an outpatient referral to[facility].  [name] 01/04/2011, 10:44 AM

## 2011-01-04 NOTE — OR Nursing (Signed)
Meal served 

## 2011-01-04 NOTE — OR Nursing (Signed)
Dr Cooper at bedside to discuss results and treatment plan with pt and family 

## 2011-01-04 NOTE — Interval H&P Note (Signed)
History and Physical Interval Note:  65 year old woman with history of long QT syndrome who is status post AICD. She is now noted to have severe LV dysfunction and was referred for cardiac catheterization to rule out ischemic cardiomyopathy. I reviewed the specific risks of cardiac catheterization plus or minus PCI  including but not limited to bleeding, vascular injury, stroke, myocardial infarction, and death with the patient and she agrees to proceed.   01/04/2011   10:04 AM   Lori York  has presented today for surgery, with the diagnosis of chest pain with low EF  The various methods of treatment have been discussed with the patient and family. After consideration of risks, benefits and other options for treatment, the patient has consented to  Procedure(s): JV LEFT HEART CATHETERIZATION WITH CORONARY ANGIOGRAM as a surgical intervention .  The patients' history has been reviewed, patient examined, no change in status, stable for surgery.  I have reviewed the patients' chart and labs.  Questions were answered to the patient's satisfaction.     [name]  MD

## 2011-01-07 ENCOUNTER — Encounter (INDEPENDENT_AMBULATORY_CARE_PROVIDER_SITE_OTHER): Payer: Medicare Other | Admitting: Cardiology

## 2011-01-07 ENCOUNTER — Other Ambulatory Visit (INDEPENDENT_AMBULATORY_CARE_PROVIDER_SITE_OTHER): Payer: Medicare Other | Admitting: *Deleted

## 2011-01-07 ENCOUNTER — Telehealth: Payer: Self-pay | Admitting: Internal Medicine

## 2011-01-07 ENCOUNTER — Telehealth: Payer: Self-pay | Admitting: Cardiovascular Disease

## 2011-01-07 ENCOUNTER — Other Ambulatory Visit: Payer: Self-pay | Admitting: Cardiology

## 2011-01-07 DIAGNOSIS — I509 Heart failure, unspecified: Secondary | ICD-10-CM

## 2011-01-07 DIAGNOSIS — I714 Abdominal aortic aneurysm, without rupture, unspecified: Secondary | ICD-10-CM

## 2011-01-07 DIAGNOSIS — I5032 Chronic diastolic (congestive) heart failure: Secondary | ICD-10-CM

## 2011-01-07 LAB — BASIC METABOLIC PANEL
BUN: 14 mg/dL (ref 6–23)
Chloride: 108 mEq/L (ref 96–112)
GFR: 66.56 mL/min (ref >60.00)
Potassium: 3.7 mEq/L (ref 3.5–5.1)
Sodium: 143 mEq/L (ref 135–145)

## 2011-01-07 NOTE — Telephone Encounter (Signed)
LOV faxed to [facility] @ [phone]  01/07/11/km

## 2011-01-07 NOTE — Telephone Encounter (Signed)
Looks as if she had her ultrasound today No need to address

## 2011-01-07 NOTE — Telephone Encounter (Signed)
New Msg: Pt calling stating that she had abdominal aorta this morning at 8 am and pt overslept. Pt wanted to rs; soonest appt was not until December 7th and pt didn't know if it was ok to wait that long to rs. Please return pt call to discuss further.

## 2011-01-08 ENCOUNTER — Encounter (HOSPITAL_BASED_OUTPATIENT_CLINIC_OR_DEPARTMENT_OTHER): Payer: Self-pay

## 2011-01-09 ENCOUNTER — Other Ambulatory Visit: Payer: Self-pay | Admitting: Physician Assistant

## 2011-01-09 ENCOUNTER — Telehealth: Payer: Self-pay | Admitting: Physician Assistant

## 2011-01-09 ENCOUNTER — Ambulatory Visit (INDEPENDENT_AMBULATORY_CARE_PROVIDER_SITE_OTHER): Payer: Medicare Other | Admitting: Physician Assistant

## 2011-01-09 ENCOUNTER — Other Ambulatory Visit: Payer: Medicare Other | Admitting: *Deleted

## 2011-01-09 ENCOUNTER — Encounter: Payer: Self-pay | Admitting: Physician Assistant

## 2011-01-09 ENCOUNTER — Other Ambulatory Visit: Payer: Self-pay | Admitting: *Deleted

## 2011-01-09 DIAGNOSIS — I2510 Atherosclerotic heart disease of native coronary artery without angina pectoris: Secondary | ICD-10-CM

## 2011-01-09 DIAGNOSIS — I10 Essential (primary) hypertension: Secondary | ICD-10-CM

## 2011-01-09 DIAGNOSIS — I714 Abdominal aortic aneurysm, without rupture, unspecified: Secondary | ICD-10-CM

## 2011-01-09 DIAGNOSIS — I701 Atherosclerosis of renal artery: Secondary | ICD-10-CM

## 2011-01-09 DIAGNOSIS — I5022 Chronic systolic (congestive) heart failure: Secondary | ICD-10-CM

## 2011-01-09 DIAGNOSIS — R079 Chest pain, unspecified: Secondary | ICD-10-CM

## 2011-01-09 DIAGNOSIS — F17200 Nicotine dependence, unspecified, uncomplicated: Secondary | ICD-10-CM

## 2011-01-09 DIAGNOSIS — I4581 Long QT syndrome: Secondary | ICD-10-CM

## 2011-01-09 MED ORDER — CARVEDILOL 12.5 MG PO TABS: 25.0000 mg | ORAL_TABLET | Freq: Two times a day (BID) | ORAL | Status: DC

## 2011-01-09 MED ORDER — NITROGLYCERIN 0.4 MG SL SUBL: 0.4000 mg | SUBLINGUAL_TABLET | SUBLINGUAL | Status: DC | PRN

## 2011-01-09 NOTE — Assessment & Plan Note (Signed)
I have explained to her the importance of smoking cessation.

## 2011-01-09 NOTE — Assessment & Plan Note (Signed)
She will eventually need to see [facility] for repair of her AAA.  She will need to see [facility] first.

## 2011-01-09 NOTE — Patient Instructions (Addendum)
Your physician has recommended you make the following change in your medication: INCREASE COREG TO 25 MG TWICE DAILY; A PRESCRIPTION FOR NITROGLYCERIN HAS BEEN SENT IN   You have been referred to DR. OWEN WITH [facility] [phone] [address]  [place] [address].

## 2011-01-09 NOTE — Assessment & Plan Note (Signed)
Refer to [facility].  Continue ASA and statin.  Will make sure she has a RX for NTG.

## 2011-01-09 NOTE — Telephone Encounter (Signed)
pharmacy wanted more description on nitro/ done

## 2011-01-09 NOTE — Assessment & Plan Note (Addendum)
Volume stable.  Adjust coreg as noted.

## 2011-01-09 NOTE — Telephone Encounter (Signed)
New Msg: Revonda Standard from [facility] pharmacy calling in regards to pt NITRO. Please return call to discuss further.

## 2011-01-09 NOTE — Assessment & Plan Note (Addendum)
Recent BMET with stable K+ and creatinine.

## 2011-01-09 NOTE — Assessment & Plan Note (Signed)
She is s/p AICD for primary prevention.

## 2011-01-09 NOTE — Progress Notes (Signed)
[address] [place], [place]  [address] Phone: [phone] Fax:  [phone]   History of Present Illness: PCP:  Dr. Allyne Gee  Primary Cardiologist:  Dr. Hillis Range  Primary Electrophysiologist:  Dr. Hillis Range   Lori York is a 65 y.o. female who presents for follow up. She has a h/o Long QT syndrome with a FHx of sudden cardiac death. She is s/p AICD. She has a h/o noncompliance with ICD follow up and medications. She also has a h/o DM2, HTN, glaucoma. Last echo 10/09: EF 55-60%, mild LVH, mild diastolic dysfunction, mildly increased PASP. She was seen recently with cough and orthopnea. BP was also extremely high. She was started on coreg for BP control and Lasix 40 mg QD.   I saw her in follow up 11/15.  She was better but her BP was still high.  I adjusted her hydralazine and coreg and arranged follow up today.  In the interim, her echo returned with an EF of 20-25%.  She was referred for [facility] 01/04/11: dLM 20%, pLAD 90%, oD1 80%, RCA 80%, EF 35-40%.  Aortogram demonstrated prox left RA 80% stenosis, abdominal aortic anuerysm and prox right CIA aneurysm.  She was brought in for u/s to size her AAA.  Preliminary report from 01/07/11: 5.3x5.5 cm infrarenal fusiform AAA.  She is doing well.  She has an occasional "twinge" in her chest when she is upset.  She denies exertional heaviness or tightness, arm or jaw pain.  The patient denies shortness of breath, syncope, orthopnea, PND or significant pedal edema.  She is still smoking.  She is anxious about her new diagnosis.  Past Medical History  Diagnosis Date  . 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 HTN (hypertension)   . Diabetes mellitus   . Glaucoma   . Noncompliance   . CAD (coronary artery disease)     [facility] 01/04/11: dLM 20%, pLAD 90%, oD1 80%, RCA 80%, EF 35-40%.  . Ischemic cardiomyopathy   . Chronic systolic heart failure   . AAA (abdominal aortic aneurysm)     u/s 01/07/11:  5.3x5.5 cm infrarenal fusiform AAA.  Marland Kitchen Renal artery stenosis     at [facility] 11/12: prox left RA 80% stenosis  . HLD (hyperlipidemia)     Current Outpatient Prescriptions  Medication Sig Dispense Refill  . aspirin 81 MG tablet Take 81 mg by mouth daily.        Marland Kitchen atorvastatin (LIPITOR) 20 MG tablet Take 20 mg by mouth daily.        . carvedilol (COREG) 12.5 MG tablet Take 2 tablets (25 mg total) by mouth 2 (two) times daily.  60 tablet  11  . esomeprazole (NEXIUM) 40 MG capsule Take 40 mg by mouth daily before breakfast.        . furosemide (LASIX) 40 MG tablet Take 1 tablet (40 mg total) by mouth daily.  30 tablet  11  . hydrALAZINE (APRESOLINE) 25 MG tablet Take 2 tablets (50 mg total) by mouth 3 (three) times daily.  90 tablet  3  . losartan (COZAAR) 100 MG tablet Take 100 mg by mouth daily.        . metFORMIN (GLUCOPHAGE) 500 MG tablet Take 500 mg by mouth daily.        . nitrofurantoin, macrocrystal-monohydrate, (MACROBID) 100 MG capsule Take 100 mg by mouth 2 (two) times daily.        Marland Kitchen  omeprazole (PRILOSEC) 40 MG capsule Take 40 mg by mouth daily.        Marland Kitchen PARoxetine (PAXIL) 20 MG tablet Take 20 mg by mouth daily.        Marland Kitchen zolpidem (AMBIEN) 10 MG tablet Take 10 mg by mouth at bedtime.        Marland Kitchen DISCONTD: carvedilol (COREG) 12.5 MG tablet Take 1 tablet (12.5 mg total) by mouth 2 (two) times daily.  60 tablet  11  . nitroGLYCERIN (NITROSTAT) 0.4 MG SL tablet Place 1 tablet (0.4 mg total) under the tongue every 5 (five) minutes as needed for chest pain.  25 tablet  3    Allergies: Allergies  Allergen Reactions  . Penicillins     History  Substance Use Topics  . Smoking status: Current Everyday Smoker -- 0.3 packs/day    Types: Cigarettes  . Smokeless tobacco: Never Used   Comment: Smokes less thatn 1/2 pack per day and has no interest in [place] t this time.   . Alcohol Use: No     Vital Signs: BP 140/78  Pulse 85  Ht 5\' 4"  (1.626 m)  Wt 186 lb 12.8 oz (84.732 kg)  BMI  32.06 kg/m2  PHYSICAL EXAM: Well nourished, well developed, in no acute distress HEENT: normal Neck: no JVD Cardiac:  normal S1, S2; RRR; no murmur Lungs:  clear to auscultation bilaterally, no wheezing, rhonchi or rales Abd: soft, nontender, no hepatomegaly Ext: no edema; RFA site without hematoma or bruit Skin: warm and dry Neuro:  CNs 2-12 intact, no focal abnormalities noted Psych: normal affect  EKG:   NSR, HR 85, LAD, diffuse TW inversions, QTc 590 ms, no change from prior  ASSESSMENT AND PLAN:

## 2011-01-09 NOTE — Assessment & Plan Note (Signed)
Needs better control.  Increase coreg to 25 mg BID.

## 2011-01-11 ENCOUNTER — Encounter: Payer: Self-pay | Admitting: *Deleted

## 2011-01-14 ENCOUNTER — Institutional Professional Consult (permissible substitution) (INDEPENDENT_AMBULATORY_CARE_PROVIDER_SITE_OTHER): Payer: Medicare Other | Admitting: Thoracic Surgery (Cardiothoracic Vascular Surgery)

## 2011-01-14 ENCOUNTER — Encounter: Payer: Self-pay | Admitting: Thoracic Surgery (Cardiothoracic Vascular Surgery)

## 2011-01-14 ENCOUNTER — Other Ambulatory Visit: Payer: Self-pay | Admitting: Thoracic Surgery (Cardiothoracic Vascular Surgery)

## 2011-01-14 ENCOUNTER — Ambulatory Visit
Admission: RE | Admit: 2011-01-14 | Discharge: 2011-01-14 | Disposition: A | Discharge status: Home / Self Care | Payer: Medicare Other | Source: Ambulatory Visit | Attending: Thoracic Surgery (Cardiothoracic Vascular Surgery) | Admitting: Thoracic Surgery (Cardiothoracic Vascular Surgery)

## 2011-01-14 ENCOUNTER — Other Ambulatory Visit: Payer: Self-pay | Admitting: *Deleted

## 2011-01-14 VITALS — BP 139/81 | HR 70 | Resp 18 | Ht 64.0 in | Wt 184.0 lb

## 2011-01-14 DIAGNOSIS — I714 Abdominal aortic aneurysm, without rupture: Secondary | ICD-10-CM

## 2011-01-14 DIAGNOSIS — R05 Cough: Secondary | ICD-10-CM

## 2011-01-14 DIAGNOSIS — I2510 Atherosclerotic heart disease of native coronary artery without angina pectoris: Secondary | ICD-10-CM

## 2011-01-14 NOTE — Progress Notes (Signed)
[address]            [name] [address]          [phone]     CARDIOTHORACIC SURGERY CONSULTATION REPORT  PCP is Gwynneth Aliment, MD Referring Provider is Allred, Quita Skye, MD  Chief Complaint  Patient presents with  . Coronary Artery Disease    Referral from Dr Johney Frame for surgical eval on coronary Artery Dx. cath 01/04/11   . AAA    5.5cm    HPI:  Patient is a 65 year old African American female from [place] with no previous history of coronary artery disease but risk factors notable for history of hypertension, type 2 diabetes mellitus, hyperlipidemia, long-standing tobacco abuse, and previous history of congestive heart failure. The patient is followed by Dr. Johney Frame with history of prolonged QT syndrome and sudden death. She originally had a pacemaker placed in 1997 and this was later exchanged to a defibrillator. She states that her defibrillator fired a couple of times proximally 5 years ago and shocked her back from a potentially lethal arrhythmia. Her most recent defibrillator was replaced in 2010. Over the years she has been noncompliant at times with followup. She recently returned for followup with symptoms of cough, shortness breath and orthopnea. Her blood pressure was reportedly extremely high. A followup echocardiogram was performed demonstrating severe left ventricular dysfunction with ejection fraction 20-25%. She subsequently underwent elective cardiac catheterization last week by Dr. Excell Seltzer. She was found to have severe two-vessel coronary artery disease with severe proximal left anterior descending coronary artery stenosis and moderate to severe left ventricular dysfunction. She was also noted to have an infrarenal abdominal aortic aneurysm. The patient has been referred for possible elective surgical revascularization for treatment of her coronary artery disease.  Past Medical History  Diagnosis Date  . Long Q-T syndrome     w  fx of sudden cardiac arrest  . HTN (hypertension)   . Diabetes mellitus   . Glaucoma   . Noncompliance   . CAD (coronary artery disease)     LHC 01/04/11: dLM 20%, pLAD 90%, oD1 80%, RCA 80%, EF 35-40%.  . Ischemic cardiomyopathy   . Chronic systolic heart failure   . AAA (abdominal aortic aneurysm)     u/s 01/07/11: 5.3x5.5 cm infrarenal fusiform AAA.  Marland Kitchen Renal artery stenosis     at [facility] 11/12: prox left RA 80% stenosis  . HLD (hyperlipidemia)     Past Surgical History  Procedure Date  . Cardiac defibrillator placement     in 1997 with revision to a  Guidant model 1860 single chamber implantable cardioverter- defib on Feb18, 2003, most recent generator change 2/11 by Dr Johney Frame S[facility]  . Laminectomy May 2008  . Right knee resconstructuion 1970  . Laminectomy 11/20/07     LF5-S1 fusion    Family History  Problem Relation Age of Onset  . Long QT syndrome Mother   . Long QT syndrome Sister   . Long QT syndrome Brother   . Long QT syndrome Daughter   . Coronary artery disease Sister 24    Social History History  Substance Use Topics  . Smoking status: Current Everyday Smoker -- 0.3 packs/day    Types: Cigarettes  . Smokeless tobacco: Never Used   Comment: Smokes less thatn 1/2 pack per day and has no interest in [place] t this time.   . Alcohol  Use: No    Current Outpatient Prescriptions  Medication Sig Dispense Refill  . aspirin 81 MG tablet Take 81 mg by mouth daily.        Marland Kitchen atorvastatin (LIPITOR) 20 MG tablet Take 20 mg by mouth daily.        . carvedilol (COREG) 12.5 MG tablet Take 2 tablets (25 mg total) by mouth 2 (two) times daily.  60 tablet  11  . furosemide (LASIX) 40 MG tablet Take 1 tablet (40 mg total) by mouth daily.  30 tablet  11  . hydrALAZINE (APRESOLINE) 25 MG tablet Take 2 tablets (50 mg total) by mouth 3 (three) times daily.  90 tablet  3  . losartan (COZAAR) 100 MG tablet Take 100 mg by mouth daily.        . metFORMIN (GLUCOPHAGE) 500 MG tablet  Take 500 mg by mouth daily.        . nitroGLYCERIN (NITROSTAT) 0.4 MG SL tablet Place 1 tablet (0.4 mg total) under the tongue every 5 (five) minutes as needed for chest pain. Up to three doses. Call 911 if no relief  25 tablet  3  . omeprazole (PRILOSEC) 40 MG capsule Take 40 mg by mouth daily.        Marland Kitchen PARoxetine (PAXIL) 20 MG tablet Take 20 mg by mouth daily.        Marland Kitchen zolpidem (AMBIEN) 10 MG tablet Take 10 mg by mouth at bedtime.        Marland Kitchen esomeprazole (NEXIUM) 40 MG capsule Take 40 mg by mouth daily before breakfast.          Allergies  Allergen Reactions  . Penicillins     Review of Systems:  General:  normal appetite, normal energy, reports losing over 100 lbs in weight over last year since her son committed suicide  Respiratory: + cough increased over last several days, productive of yellowish sputum, no wheezing, no hemoptysis, no pain with inspiration or cough, + exertional shortness of breath   Cardiac:   occasional "twinge" of chest pain or tightness, typically associated with stress or after eating, + exertional SOB, no resting SOB, + PND, no orthopnea, no LE edema, no palpitations, 1 episode near syncope on Thanksgiving  GI:   no difficulty swallowing, no hematochezia, no hematemesis, no melena, no constipation, no diarrhea   GU:   no dysuria, no urgency, no frequency   Musculoskeletal: no arthritis, no arthralgia   Vascular:  no pain suggestive of claudication   Neuro:   no symptoms suggestive of TIA's, no seizures, no headaches, no peripheral neuropathy   Endocrine:  Negative, doesn't check sugars  HEENT:  no loose teeth or painful teeth, some recent vision changes attributed to glaucoma  Psych:   no anxiety,+ depression since her son's suicide   Physical Exam:   BP 139/81  Pulse 70  Resp 18  Ht 5\' 4"  (1.626 m)  Wt 184 lb (83.462 kg)  BMI 31.58 kg/m2  SpO2 96%  General:  chronically  ill-appearing, appears older than stated age  HEENT:  Unremarkable   Neck:   no  JVD, no bruits, no adenopathy   Chest:   clear to auscultation, symmetrical breath sounds, no wheezes, no rhonchi   CV:   RRR, no  murmur   Abdomen:  Obese, soft, non-tender, no masses   Extremities:  warm, well-perfused, pulses not palpable at ankle  Rectal/GU  Deferred  Neuro:   Grossly non-focal and symmetrical throughout  Skin:  Clean and dry, no rashes, no breakdown  Diagnostic Tests:  2-D echocardiogram performed 12/27/2010 is reviewed. This demonstrates severe left ventricular dysfunction with ejection fraction estimated 20-25%. There is no significant mitral regurgitation. No other significant abnormalities are noted.  Cardiac catheterization performed by Dr. Excell Seltzer is reviewed. This demonstrates severe 80% proximal stenosis of the left anterior descending coronary artery.  The high-grade stenosis occurs proximal to the takeoff of the diagonal branch and does not involve the diagonal branch. The terminal portion of the left anterior descending coronary artery appears to be reasonably good target vessel for grafting. The left circumflex coronary artery is free of significant disease. There is codominant coronary circulation. There is long segment 80% stenosis of the mid right coronary artery. The posterior descending coronary artery is somewhat small but appears to be reasonably good target vessels for grafting. There is moderate left ventricular dysfunction.  Left ventricular ejection fraction is estimated 35-40%. Aortogram of the abdominal aorta demonstrates an infrarenal abdominal aortic aneurysm. There is 80% proximal stenosis of left renal artery near its origin.  Impression:  Severe two-vessel coronary artery disease with moderate to severe left ventricular dysfunction. The patient has symptoms of exertional shortness of breath as well as somewhat atypical chest pain maybe angina pectoris. Based upon her coronary anatomy I agree that she would probably do best in the long run with  surgical revascularization. The patient also has history of prolonged QT syndrome with sudden death with a defibrillator in place. The patient has recently discovered infrarenal abdominal aortic aneurysm that measures in excess of 5.3 cm diameter by ultrasound. The patient has 80% left renal artery stenosis and numerous comorbid medical diseases including hypertension, type 2 diabetes mellitus, hyperlipidemia, and long-standing tobacco abuse. The patient complains of ongoing persistent cough productive of yellowish sputum.   Plan:  I have outlined the indications, risks, and potential benefits of coronary artery bypass grafting at length with the patient and her niece here in the office today. They understand all potential associated risks of surgery including but not limited to risk of death, stroke, myocardial infarction, congestive heart failure, respiratory failure, renal failure, pneumonia, bleeding requiring blood transfusion, arrhythmia, acute rupture of known abdominal aortic aneurysm, late recurrence of symptomatic coronary artery disease and/or congestive heart failure. We will obtain chest x-ray today to make certain that there is no sign of ongoing pneumonia. We will ask one of the vascular surgeons to see her in consultation. I think it probably makes sense to proceed with surgical revascularization of her coronary artery disease prior to treating her abdominal aortic aneurysm. However, I would like to vascular surgeons to be involved with her care now because of the large size for aneurysm. We will plan to see her back next week for followup with tentatively is to proceed with elective surgery on December 13. She has been instructed to find a way to quit smoking completely.    Salvatore Decent. Cornelius Moras, MD

## 2011-01-15 ENCOUNTER — Other Ambulatory Visit: Payer: Self-pay | Admitting: Thoracic Surgery (Cardiothoracic Vascular Surgery)

## 2011-01-15 ENCOUNTER — Other Ambulatory Visit: Payer: Self-pay

## 2011-01-15 DIAGNOSIS — I2510 Atherosclerotic heart disease of native coronary artery without angina pectoris: Secondary | ICD-10-CM

## 2011-01-18 ENCOUNTER — Encounter: Payer: Self-pay | Admitting: Vascular Surgery

## 2011-01-19 ENCOUNTER — Emergency Department (HOSPITAL_COMMUNITY)
Admission: EM | Admit: 2011-01-19 | Discharge: 2011-01-20 | Disposition: A | Discharge status: Home / Self Care | Payer: Medicare Other | Attending: Emergency Medicine | Admitting: Emergency Medicine

## 2011-01-19 ENCOUNTER — Encounter (HOSPITAL_COMMUNITY): Payer: Self-pay | Admitting: *Deleted

## 2011-01-19 DIAGNOSIS — E119 Type 2 diabetes mellitus without complications: Secondary | ICD-10-CM | POA: Insufficient documentation

## 2011-01-19 DIAGNOSIS — R040 Epistaxis: Secondary | ICD-10-CM | POA: Insufficient documentation

## 2011-01-19 DIAGNOSIS — I2510 Atherosclerotic heart disease of native coronary artery without angina pectoris: Secondary | ICD-10-CM | POA: Insufficient documentation

## 2011-01-19 DIAGNOSIS — I10 Essential (primary) hypertension: Secondary | ICD-10-CM | POA: Insufficient documentation

## 2011-01-19 DIAGNOSIS — Z95810 Presence of automatic (implantable) cardiac defibrillator: Secondary | ICD-10-CM | POA: Insufficient documentation

## 2011-01-19 MED ORDER — CLONIDINE HCL 0.1 MG PO TABS
0.1000 mg | ORAL_TABLET | Freq: Once | ORAL | Status: AC
  Administered 2011-01-19: 0.1 mg via ORAL
  Filled 2011-01-19: qty 1

## 2011-01-19 NOTE — ED Notes (Signed)
ZOX:[id number]<AV> Expected date:01/19/11<BR> Expected time: 8:54 PM<BR> Means of arrival:Ambulance<BR> Comments:<BR> EMS 120 GC, 64 yof epistaxis

## 2011-01-19 NOTE — ED Notes (Signed)
Pt presents w/ uncontrolled epistaxis x 1.5 hrs, second time of epistaxis today. Pt is hypertensive w/ hx of hypertension that is normally controlled.

## 2011-01-19 NOTE — ED Provider Notes (Signed)
History     CSN: [id number] Arrival date & time: 01/19/2011  9:31 PM   First MD Initiated Contact with Patient 01/19/11 2305      Chief Complaint  Patient presents with  . Epistaxis  . Hypertension    HPI   history provided by the patient and family. Patient presents with complaints of right nostril nosebleed that began around 5:30 PM earlier today. Patient has not been able to stop the bleeding completely. He has tried holding pressure to nose brief moments of relief of symptoms only to have bleeding return. Patient does not take any blood thinner medications. Patient denies feeling any increased lightheadedness, shortness of breath or increased fatigue.   Past Medical History  Diagnosis Date  . 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 HTN (hypertension)   . Diabetes mellitus   . Glaucoma   . Noncompliance   . CAD (coronary artery disease)     LHC 01/04/11: dLM 20%, pLAD 90%, oD1 80%, RCA 80%, EF 35-40%.  . Ischemic cardiomyopathy   . Chronic systolic heart failure   . AAA (abdominal aortic aneurysm)     u/s 01/07/11: 5.3x5.5 cm infrarenal fusiform AAA.  Marland Kitchen Renal artery stenosis     at [facility] 11/12: prox left RA 80% stenosis  . HLD (hyperlipidemia)     Past Surgical History  Procedure Date  . Cardiac defibrillator placement     in 1997 with revision to a  Guidant model 1860 single chamber implantable cardioverter- defib on Feb18, 2003, most recent generator change 2/11 by Dr Johney Frame S[facility]  . Laminectomy May 2008  . Right knee resconstructuion 1970  . Laminectomy 11/20/07     LF5-S1 fusion  . Back surgery     Family History  Problem Relation Age of Onset  . Long QT syndrome Mother   . Long QT syndrome Sister   . Long QT syndrome Brother   . Long QT syndrome Daughter   . Coronary artery disease Sister 57    History  Substance Use Topics  . Smoking status: Current Everyday Smoker -- 0.3 packs/day    Types: Cigarettes  . Smokeless tobacco: Never Used   Comment: Smokes less thatn 1/2 pack per day and has no interest in [place] t this time.   . Alcohol Use: No    OB History    Grav Para Term Preterm Abortions TAB SAB Ect Mult Living                  Review of Systems  Constitutional: Negative for fever and chills.  HENT: Positive for congestion and rhinorrhea. Negative for sore throat.   Respiratory: Positive for cough.   All other systems reviewed and are negative.    Allergies  Penicillins  Home Medications   Current Outpatient Rx  Name Route Sig Dispense Refill  . ASPIRIN 81 MG PO TABS Oral Take 81 mg by mouth daily.      . ATORVASTATIN CALCIUM 20 MG PO TABS Oral Take 20 mg by mouth daily.      Marland Kitchen CARVEDILOL 12.5 MG PO TABS Oral Take 2 tablets (25 mg total) by mouth 2 (two) times daily. 60 tablet 11  . ESOMEPRAZOLE MAGNESIUM 40 MG PO CPDR Oral Take 40 mg by mouth daily before breakfast.      . FUROSEMIDE 40 MG PO TABS Oral Take 1 tablet (40 mg total) by mouth daily. 30 tablet 11  . HYDRALAZINE HCL 25  MG PO TABS Oral Take 2 tablets (50 mg total) by mouth 3 (three) times daily. 90 tablet 3  . LOSARTAN POTASSIUM 100 MG PO TABS Oral Take 100 mg by mouth daily.      Marland Kitchen METFORMIN HCL 500 MG PO TABS Oral Take 500 mg by mouth daily.      Marland Kitchen OMEPRAZOLE 40 MG PO CPDR Oral Take 40 mg by mouth daily.      Marland Kitchen PAROXETINE HCL 20 MG PO TABS Oral Take 20 mg by mouth daily.      Marland Kitchen ZOLPIDEM TARTRATE 10 MG PO TABS Oral Take 10 mg by mouth at bedtime.      Marland Kitchen NITROGLYCERIN 0.4 MG SL SUBL Sublingual Place 1 tablet (0.4 mg total) under the tongue every 5 (five) minutes as needed for chest pain. Up to three doses. Call 911 if no relief 25 tablet 3    BP 190/121  Pulse 85  Temp(Src) 99.1 F (37.3 C) (Oral)  SpO2 96%  Physical Exam  Nursing note and vitals reviewed. Constitutional: She is oriented to person, place, and time. She appears well-developed and well-nourished.  HENT:  Head: Normocephalic.  Mouth/Throat: Oropharynx is clear and  moist.       Right nostril epistaxis  Eyes: Conjunctivae and EOM are normal. Pupils are equal, round, and reactive to light.  Cardiovascular: Normal rate, regular rhythm and normal heart sounds.   Pulmonary/Chest: Effort normal and breath sounds normal.  Neurological: She is alert and oriented to person, place, and time.  Skin: Skin is warm. No pallor.  Psychiatric: She has a normal mood and affect. Her behavior is normal.    ED Course  EPISTAXIS MANAGEMENT Performed by: Angus Seller Authorized by: Angus Seller Consent: Verbal consent obtained. Risks and benefits: risks, benefits and alternatives were discussed Consent given by: patient Patient identity confirmed: verbally with patient Time out: Immediately prior to procedure a "time out" was called to verify the correct patient, procedure, equipment, support staff and site/side marked as required. Patient sedated: no Treatment site: right posterior Repair method: nasal balloon Post-procedure assessment: bleeding stopped Treatment complexity: simple Patient tolerance: Patient tolerated the procedure well with no immediate complications.    .  1. Epistaxis      MDM  11 PM patient seen and evaluated. Patient in no acute distress but with active right nostril bleed.  Packing placed and bleeding controlled. Patient's blood pressure also improved with treatment.      Angus Seller, [place] 01/20/11 [phone]

## 2011-01-20 MED ORDER — LABETALOL HCL 5 MG/ML IV SOLN
5.0000 mg | Freq: Once | INTRAVENOUS | Status: DC
  Filled 2011-01-20: qty 4

## 2011-01-20 MED ORDER — CIPROFLOXACIN HCL 500 MG PO TABS: 500.0000 mg | ORAL_TABLET | Freq: Two times a day (BID) | ORAL | Status: DC

## 2011-01-20 NOTE — ED Provider Notes (Signed)
Medical screening examination/treatment/procedure(s) were performed by non-physician practitioner and as supervising physician I was immediately available for consultation/collaboration.   Armoni Kludt, MD 01/20/11 0608 

## 2011-01-21 ENCOUNTER — Inpatient Hospital Stay: Admission: RE | Admit: 2011-01-21 | Payer: Medicare Other | Source: Ambulatory Visit

## 2011-01-21 ENCOUNTER — Ambulatory Visit (INDEPENDENT_AMBULATORY_CARE_PROVIDER_SITE_OTHER): Payer: Medicare Other | Admitting: Thoracic Surgery (Cardiothoracic Vascular Surgery)

## 2011-01-21 ENCOUNTER — Encounter: Payer: Medicare Other | Admitting: Vascular Surgery

## 2011-01-21 ENCOUNTER — Encounter: Payer: Self-pay | Admitting: Thoracic Surgery (Cardiothoracic Vascular Surgery)

## 2011-01-21 VITALS — BP 176/97 | HR 88 | Temp 98.3°F | Resp 20 | Ht 64.0 in | Wt 188.0 lb

## 2011-01-21 DIAGNOSIS — I2510 Atherosclerotic heart disease of native coronary artery without angina pectoris: Secondary | ICD-10-CM | POA: Insufficient documentation

## 2011-01-21 NOTE — Progress Notes (Signed)
[address]            [name] [address]          [phone]     CARDIOTHORACIC SURGERY OFFICE NOTE  Referring Provider is [name], MD PCP is Gwynneth Aliment, MD   HPI:  Patient returns for follow-up of coronary artery disease. She was originally seen in consultation last week and a full note was created at that time. She was scheduled to have CT angiogram and see Dr. Hart Rochester for consultation because of her recently discovered infrarenal abdominal aortic aneurysm.  We had tentatively plan for elective coronary artery bypass surgery later this week. However, the patient was taken to the emergency room at [facility] this past weekend with severe epistaxis requiring nasal packing. Her blood pressure was extremely high at the time she was seen in the emergency department. She was seen in followup by Dr. Ezzard Standing earlier today and nasal packing was removed. She missed her appointment here in our office this morning, but when we called her she agreed, and this afternoon for followup. She states that she still feels congested and she has a cough which has developed since she was seen in the emergency room. She's not had shortness of breath. She's not had any chest pain.   Current Outpatient Prescriptions  Medication Sig Dispense Refill  . atorvastatin (LIPITOR) 20 MG tablet Take 20 mg by mouth daily.        . carvedilol (COREG) 12.5 MG tablet Take 2 tablets (25 mg total) by mouth 2 (two) times daily.  60 tablet  11  . esomeprazole (NEXIUM) 40 MG capsule Take 40 mg by mouth daily before breakfast.        . furosemide (LASIX) 40 MG tablet Take 1 tablet (40 mg total) by mouth daily.  30 tablet  11  . hydrALAZINE (APRESOLINE) 25 MG tablet Take 2 tablets (50 mg total) by mouth 3 (three) times daily.  90 tablet  3  . losartan (COZAAR) 100 MG tablet Take 100 mg by mouth daily.        . metFORMIN (GLUCOPHAGE) 500 MG tablet Take 500 mg by mouth daily.        .  nitroGLYCERIN (NITROSTAT) 0.4 MG SL tablet Place 1 tablet (0.4 mg total) under the tongue every 5 (five) minutes as needed for chest pain. Up to three doses. Call 911 if no relief  25 tablet  3  . omeprazole (PRILOSEC) 40 MG capsule Take 40 mg by mouth daily.        Marland Kitchen PARoxetine (PAXIL) 20 MG tablet Take 20 mg by mouth daily.        Marland Kitchen zolpidem (AMBIEN) 10 MG tablet Take 10 mg by mouth at bedtime.            Physical Exam:   BP 176/97  Pulse 88  Temp(Src) 98.3 F (36.8 C) (Oral)  Resp 20  Ht 5\' 4"  (1.626 m)  Wt 188 lb (85.276 kg)  BMI 32.27 kg/m2  SpO2 95%  HEENT:  No ongoing bleeding  Chest:   Coarse rhonchi both lung fields  CV:   Regular rate and rhythm  Abdomen:  Soft and nontender  Extremities:  Warm and well-perfused  Diagnostic Tests:  n/a   Impression:  Severe three-vessel coronary artery disease with preserved left ventricular function. The patient has severe hypertension which remains poorly  controlled. She was seen in the emergency department this past weekend with severe epistaxis associated with systolic blood pressure greater than 220. She has renal artery stenosis as well as recently discovered large infrarenal abdominal aortic aneurysm. She now has an ongoing productive cough that presumably is related to the fact that she had severe epistaxis she may of aspirated blood.  Plan:  We will postpone elective coronary artery bypass surgery for the time being. I've asked that Ms. Melendrez make an effort to reschedule her appointment with Dr. Hart Rochester so that she can have her aneurysm properly evaluated. I've also instructed her to try to schedule an appointment with Dr. Johney Frame or Dr. Excell Seltzer or one of their colleagues it [facility] because of her poorly controlled hypertension. I think we should postpone surgery until her medical issues can be brought under better control. We will plan to see her back next week for further followup.   Salvatore Decent. Cornelius Moras,  MD 01/21/2011 3:52 PM

## 2011-01-21 NOTE — Patient Instructions (Signed)
Reschedule CTA and appointment with Dr. Hart Rochester at [facility].  Schedule an appointment with Dr. Johney Frame or Dr. Excell Seltzer at [facility] ASAP to adjust blood pressure medications.

## 2011-01-22 ENCOUNTER — Ambulatory Visit (HOSPITAL_COMMUNITY): Payer: Medicare Other

## 2011-01-22 ENCOUNTER — Other Ambulatory Visit (HOSPITAL_COMMUNITY): Payer: Medicare Other

## 2011-01-24 ENCOUNTER — Encounter (HOSPITAL_COMMUNITY): Admission: RE | Payer: Self-pay | Source: Ambulatory Visit

## 2011-01-24 ENCOUNTER — Inpatient Hospital Stay (HOSPITAL_COMMUNITY)
Admission: RE | Admit: 2011-01-24 | Payer: Medicare Other | Source: Ambulatory Visit | Admitting: Thoracic Surgery (Cardiothoracic Vascular Surgery)

## 2011-01-24 SURGERY — CORONARY ARTERY BYPASS GRAFTING (CABG)
Anesthesia: General | Site: Chest

## 2011-01-25 ENCOUNTER — Encounter: Payer: Self-pay | Admitting: *Deleted

## 2011-01-28 ENCOUNTER — Ambulatory Visit (INDEPENDENT_AMBULATORY_CARE_PROVIDER_SITE_OTHER): Payer: Medicare Other | Admitting: Physician Assistant

## 2011-01-28 ENCOUNTER — Ambulatory Visit: Payer: Medicare Other | Admitting: Thoracic Surgery (Cardiothoracic Vascular Surgery)

## 2011-01-28 ENCOUNTER — Encounter: Payer: Self-pay | Admitting: Physician Assistant

## 2011-01-28 DIAGNOSIS — I2510 Atherosclerotic heart disease of native coronary artery without angina pectoris: Secondary | ICD-10-CM

## 2011-01-28 DIAGNOSIS — I4581 Long QT syndrome: Secondary | ICD-10-CM

## 2011-01-28 DIAGNOSIS — I10 Essential (primary) hypertension: Secondary | ICD-10-CM

## 2011-01-28 DIAGNOSIS — I5022 Chronic systolic (congestive) heart failure: Secondary | ICD-10-CM

## 2011-01-28 DIAGNOSIS — K921 Melena: Secondary | ICD-10-CM | POA: Insufficient documentation

## 2011-01-28 DIAGNOSIS — I714 Abdominal aortic aneurysm, without rupture: Secondary | ICD-10-CM

## 2011-01-28 DIAGNOSIS — K8020 Calculus of gallbladder without cholecystitis without obstruction: Secondary | ICD-10-CM | POA: Insufficient documentation

## 2011-01-28 NOTE — Patient Instructions (Signed)
Please watch the sodium in your diet and limit this to 2000 mg daily.  Your physician recommends that you  follow-up appointment : as scheduled with Dr. Johney Frame on Friday April 05, 2011 at 10:00am.   Your physician recommends that you continue on your current medications as directed. Please refer to the Current Medication list given to you today.

## 2011-01-28 NOTE — Assessment & Plan Note (Signed)
Patient has significant three-vessel disease and is awaiting bypass surgery

## 2011-01-28 NOTE — Assessment & Plan Note (Signed)
Has A[employer]

## 2011-01-28 NOTE — Assessment & Plan Note (Signed)
Patient has a 5.5 cm infrarenal abdominal aortic aneurysm and will have stent grafting at the time of her CABG

## 2011-01-28 NOTE — Assessment & Plan Note (Signed)
Patient is well compensated and there is no evidence of heart failure on exam today.

## 2011-01-28 NOTE — Progress Notes (Signed)
HPI:  Lori York is a 65 y.o. female who presents for follow up of high blood pressure.she recently had a nosebleed and was found to have a blood pressure of 220/120. She also needs bypass surgery as well as stent grafting of an abdominal aortic aneurysm. She states she was very stressed out about the surgery and was not taking her antihypertensives properly. She is now taking them properly and her blood pressure is well controlled. She is trying to follow a low sodium diet but does not like to use other seasonings. She has a h/o Long QT syndrome with a FHx of sudden cardiac death. She is s/p AICD. She has a h/o noncompliance with ICD follow up and medications. She also has a h/o DM2, HTN, glaucoma. She was seen recently with cough and orthopnea. BP was also extremely high. She was started on coreg for BP control and Lasix 40 mg QD.   Her BP was still high last month and her hydralazine and coreg were increased which she didn't do.  In the interim, her echo returned with an EF of 20-25%.  She was referred for [facility] 01/04/11: dLM 20%, pLAD 90%, oD1 80%, RCA 80%, EF 35-40%.  Aortogram demonstrated prox left RA 80% stenosis, abdominal aortic anuerysm and prox right CIA aneurysm.  Her AAA is 5.5cm.  Allergies  Allergen Reactions  . Penicillins     Current Outpatient Prescriptions on File Prior to Visit  Medication Sig Dispense Refill  . atorvastatin (LIPITOR) 20 MG tablet Take 20 mg by mouth daily.        . carvedilol (COREG) 12.5 MG tablet Take 2 tablets (25 mg total) by mouth 2 (two) times daily.  60 tablet  11  . esomeprazole (NEXIUM) 40 MG capsule Take 40 mg by mouth daily before breakfast.        . furosemide (LASIX) 40 MG tablet Take 1 tablet (40 mg total) by mouth daily.  30 tablet  11  . hydrALAZINE (APRESOLINE) 25 MG tablet Take 2 tablets (50 mg total) by mouth 3 (three) times daily.  90 tablet  3  . losartan (COZAAR) 100 MG tablet Take 100 mg by mouth daily.        . metFORMIN  (GLUCOPHAGE) 500 MG tablet Take 500 mg by mouth daily.        . nitroGLYCERIN (NITROSTAT) 0.4 MG SL tablet Place 1 tablet (0.4 mg total) under the tongue every 5 (five) minutes as needed for chest pain. Up to three doses. Call 911 if no relief  25 tablet  3  . omeprazole (PRILOSEC) 40 MG capsule Take 40 mg by mouth daily.        Marland Kitchen PARoxetine (PAXIL) 20 MG tablet Take 20 mg by mouth daily.        Marland Kitchen zolpidem (AMBIEN) 10 MG tablet Take 10 mg by mouth at bedtime.          Past Medical History  Diagnosis Date  . 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 HTN (hypertension)   . Diabetes mellitus   . Glaucoma   . Noncompliance   . CAD (coronary artery disease)     LHC 01/04/11: dLM 20%, pLAD 90%, oD1 80%, RCA 80%, EF 35-40%.  . Ischemic cardiomyopathy   . Chronic systolic heart failure   . AAA (abdominal aortic aneurysm)     u/s 01/07/11: 5.3x5.5 cm infrarenal fusiform AAA.  Marland Kitchen Renal artery stenosis  at [facility] 11/12: prox left RA 80% stenosis  . HLD (hyperlipidemia)   . Chest pain 01/28/11  . SOB (shortness of breath) 01/28/11    Past Surgical History  Procedure Date  . Cardiac defibrillator placement     in 1997 with revision to a  Guidant model 1860 single chamber implantable cardioverter- defib on Feb18, 2003, most recent generator change 2/11 by Dr Johney Frame SJM  . Laminectomy May 2008  . Right knee resconstructuion 1970  . Laminectomy 11/20/07     LF5-S1 fusion  . Back surgery     Family History  Problem Relation Age of Onset  . Long QT syndrome Mother   . Long QT syndrome Sister   . Long QT syndrome Brother   . Long QT syndrome Daughter   . Coronary artery disease Sister 42    History   Social History  . Marital Status: Widowed    Spouse Name: N/A    Number of Children: N/A  . Years of Education: N/A   Occupational History  . Not on file.   Social History Main Topics  . Smoking status: Current Everyday Smoker -- 0.3 packs/day    Types: Cigarettes  .  Smokeless tobacco: Never Used   Comment: Smokes less thatn 1/2 pack per day and has no interest in [place] t this time.   . Alcohol Use: No  . Drug Use: No  . Sexually Active: No   Other Topics Concern  . Not on file   Social History Narrative   Lives in [place] with her adopted son who is 64 and a god brother, who is 73. 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     ROS: See HPI Eyes: Negative Ears:Negative for hearing loss, tinnitus Cardiovascular: Negative for chest pain, palpitations,irregular heartbeat, dyspnea, dyspnea on exertion, near-syncope, orthopnea, paroxysmal nocturnal dyspnia and syncope,edema, claudication, cyanosis,.  Respiratory:   Negative for cough, hemoptysis, shortness of breath, sleep disturbances due to breathing, sputum production and wheezing.   Endocrine: Negative for cold intolerance and heat intolerance.  Hematologic/Lymphatic: Negative for adenopathy and bleeding problem. Does not bruise/bleed easily.  Musculoskeletal: Negative.   Gastrointestinal: Patient complained of black stools after her nosebleed but this has cleared.Negative for nausea, vomiting, reflux, abdominal pain, diarrhea, constipation.   Neurological: Negative.  Allergic/Immunologic: Negative for environmental allergies.   PHYSICAL EXAM: Well-nournished, in no acute distress. Neck: No JVD, HJR, Bruit, or thyroid enlargement Lungs: No tachypnea, clear without wheezing, rales, or rhonchi Cardiovascular: RRR, PMI not displaced, heart sounds normal, no murmurs, gallops, bruit, thrill, or heave. Abdomen: BS normal. Soft without organomegaly, masses, lesions or tenderness. Extremities: without cyanosis, clubbing or edema. Good distal pulses bilateral SKin: Warm, no lesions or rashes  Musculoskeletal: No deformities Neuro: no focal signs  Ht 5\' 5"  (1.651 m)  Wt 183 lb 9.6 oz (83.28 kg)  BMI 30.55 kg/m2  XBJ:YNWGNF sinus rhythm with LVH poor  R-wave progression T-wave inversion inferior lateral prolonged QT  CATH: Final Conclusions:   1. Severe proximal LAD stenosis 2. Severe mid right coronary artery stenosis 3. Nonobstructive left circumflex stenosis 4. Moderately severe segmental left ventricular systolic dysfunction 5. Infrarenal abdominal aortic aneurysm  Abdominal US:  Her maximal aneurysmal diameter is 5.5 cm. I suspect it is treatable by stent grafting. I advised her that Dr. Cornelius Moras with cardiac surgery would address appropriate timing of treatment since she requires coronary bypass surgery.

## 2011-01-28 NOTE — Assessment & Plan Note (Signed)
Blood pressure is well-controlled since she is taking her medications properly. Have instructed her on 2 g sodium diet.

## 2011-01-30 ENCOUNTER — Encounter: Payer: Self-pay | Admitting: Vascular Surgery

## 2011-01-31 ENCOUNTER — Ambulatory Visit
Admission: RE | Admit: 2011-01-31 | Discharge: 2011-01-31 | Disposition: A | Discharge status: Home / Self Care | Payer: Medicare Other | Source: Ambulatory Visit | Attending: Vascular Surgery | Admitting: Vascular Surgery

## 2011-01-31 ENCOUNTER — Encounter: Payer: Self-pay | Admitting: Vascular Surgery

## 2011-01-31 ENCOUNTER — Ambulatory Visit (INDEPENDENT_AMBULATORY_CARE_PROVIDER_SITE_OTHER): Payer: Medicare Other | Admitting: Vascular Surgery

## 2011-01-31 VITALS — BP 173/108 | HR 86 | Temp 98.5°F | Resp 16 | Ht 65.0 in | Wt 185.0 lb

## 2011-01-31 DIAGNOSIS — I714 Abdominal aortic aneurysm, without rupture, unspecified: Secondary | ICD-10-CM | POA: Insufficient documentation

## 2011-01-31 MED ORDER — IOHEXOL 350 MG/ML SOLN
100.0000 mL | Freq: Once | INTRAVENOUS | Status: AC | PRN
  Administered 2011-01-31: 100 mL via INTRAVENOUS

## 2011-01-31 NOTE — Progress Notes (Signed)
[facility] [place] HISTORY AND PHYSICAL   History of Present Illness:  Patient is a 65 y.o. year old female who presents for evaluation of abdominal aortic aneurysm.  The aneurysm is currently 6cm in diameter by CT.  The patient denies abdominal pain.  The patient denies back pain.  The patient hadenies family history of AAA.  Other medical problems include coronary artery disease. She is scheduled to have: Artery bypass grafting by Dr. Bluford Kaufmann on in the near future. She had a 90% LAD and 80% right and 80% B-1 lesion on cath in November of 2012. Her EF was 30-40%. She still continues to experience dizziness on occasion which was her symptom of her coronary disease. Other medical problems include hypertension, diabetes, medical noncompliance, renal artery stenosis, hyperlipidemia. She also has previously had an AICD placed by Dr. Johney Frame. She also currently continues to smoke a third pack of cigarettes per day and is not interested in quitting.  Past Medical History  Diagnosis Date  . 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 HTN (hypertension)   . Diabetes mellitus   . Glaucoma   . Noncompliance   . CAD (coronary artery disease)     LHC 01/04/11: dLM 20%, pLAD 90%, oD1 80%, RCA 80%, EF 35-40%.  . Ischemic cardiomyopathy   . Chronic systolic heart failure   . AAA (abdominal aortic aneurysm)     u/s 01/07/11: 5.3x5.5 cm infrarenal fusiform AAA.  Marland Kitchen Renal artery stenosis     at [facility] 11/12: prox left RA 80% stenosis  . HLD (hyperlipidemia)   . Chest pain 01/28/11  . SOB (shortness of breath) 01/28/11    Past Surgical History  Procedure Date  . Cardiac defibrillator placement     in 1997 with revision to a  Guidant model 1860 single chamber implantable cardioverter- defib on Feb18, 2003, most recent generator change 2/11 by Dr Johney Frame SJM  . Laminectomy May 2008  . Right knee resconstructuion 1970  . Laminectomy 11/20/07     LF5-S1 fusion  . Back surgery     Hysterectomy  Social History History  Substance Use Topics  . Smoking status: Current Everyday Smoker -- 0.3 packs/day    Types: Cigarettes  . Smokeless tobacco: Never Used   Comment: Smokes less thatn 1/2 pack per day and has no interest in [place] t this time.   . Alcohol Use: No    Family History Family History  Problem Relation Age of Onset  . Long QT syndrome Mother   . Long QT syndrome Sister   . Long QT syndrome Brother   . Long QT syndrome Daughter   . Coronary artery disease Sister 47    Allergies  Allergies  Allergen Reactions  . Penicillins      Current Outpatient Prescriptions  Medication Sig Dispense Refill  . atorvastatin (LIPITOR) 20 MG tablet Take 20 mg by mouth daily.        . carvedilol (COREG) 12.5 MG tablet Take 2 tablets (25 mg total) by mouth 2 (two) times daily.  60 tablet  11  . ciprofloxacin (CIPRO) 500 MG tablet Take 500 mg by mouth every 12 (twelve) hours.        Marland Kitchen esomeprazole (NEXIUM) 40 MG capsule Take 40 mg by mouth daily before breakfast.        . furosemide (LASIX) 40 MG tablet Take 1 tablet (40 mg total) by mouth daily.  30 tablet  11  .  hydrALAZINE (APRESOLINE) 25 MG tablet Take 2 tablets (50 mg total) by mouth 3 (three) times daily.  90 tablet  3  . losartan (COZAAR) 100 MG tablet Take 100 mg by mouth daily.        . metFORMIN (GLUCOPHAGE) 500 MG tablet Take 500 mg by mouth daily.        . nitroGLYCERIN (NITROSTAT) 0.4 MG SL tablet Place 1 tablet (0.4 mg total) under the tongue every 5 (five) minutes as needed for chest pain. Up to three doses. Call 911 if no relief  25 tablet  3  . omeprazole (PRILOSEC) 40 MG capsule Take 40 mg by mouth daily.        Marland Kitchen PARoxetine (PAXIL) 20 MG tablet Take 20 mg by mouth daily.        Marland Kitchen zolpidem (AMBIEN) 10 MG tablet Take 10 mg by mouth at bedtime.         No current facility-administered medications for this visit.   Facility-Administered Medications Ordered in Other Visits  Medication Dose  Route Frequency Provider Last Rate Last Dose  . iohexol (OMNIPAQUE) 350 MG/ML injection 100 mL  100 mL Intravenous Once PRN Medication Radiologist   100 mL at 01/31/11 0858    ROS:   General:  No weight loss, Fever, chills  HEENT: No recent headaches, no nasal bleeding, no visual changes, no sore throat  Neurologic: +dizziness, +blackouts No recent symptoms of stroke or mini- stroke. No recent episodes of slurred speech, or temporary blindness.  Cardiac: No recent episodes of chest pain/pressure, no shortness of breath at rest. + shortness of breath with exertion.  Denies history of atrial fibrillation or irregular heartbeat  Vascular: No history of rest pain in feet.  No history of claudication.  No history of non-healing ulcer, No history of DVT   Pulmonary: No home oxygen, no productive cough, no hemoptysis,  No asthma or wheezing  Musculoskeletal:  x[x ] Arthritis, [x ] Low back pain,  [ ]  Joint pain  Hematologic:No history of hypercoagulable state.  No history of easy bleeding.  No history of anemia  Gastrointestinal: No hematochezia or melena,  No gastroesophageal reflux, no trouble swallowing  Urinary: [ ]  chronic Kidney disease, [ ]  on HD - [ ]  MWF or [ ]  TTHS, [ ]  Burning with urination, [ ]  Frequent urination, [ ]  Difficulty urinating;   Skin: No rashes  Psychological: No history of anxiety,  No history of depression   Physical Examination  Filed Vitals:   01/31/11 1106  BP: 173/108  Pulse: 86  Temp: 98.5 F (36.9 C)  TempSrc: Oral  Resp: 16  Height: 5\' 5"  (1.651 m)  Weight: 185 lb (83.915 kg)  SpO2: 100%    Body mass index is 30.79 kg/(m^2).  General:  Alert and oriented, no acute distress HEENT: Normal Neck: No bruit or JVD Pulmonary: Clear to auscultation bilaterally Cardiac: Regular Rate and Rhythm without murmur Gastrointestinal: Soft, non-tender, non-distended, no mass, lower midline scar, vague pulsatile mass, obese Skin: No rash Extremity  Pulses:  2+ radial, brachial, femoral, absent dorsalis pedis, posterior tibial pulses bilaterally, absent popliteal pulse Musculoskeletal: No deformity or edema  Neurologic: Upper and lower extremity motor 5/5 and symmetric  DATA: CT angiogram the abdomen and pelvis was reviewed. The infrarenal abdominal aorta has a maximum diameter 6 cm. The right renal artery is the lowest. There is a 22 mm neck at this level however there is a large amount of thrombus within the neck of the  aneurysm. The neck also quickly flares up to 26 mm. Femoral arteries and external iliac artery approximately 10 mm in diameter.   ASSESSMENT: 6 cm abdominal aortic aneurysm which warrants repair. However she has symptomatic three-vessel coronary artery disease which I believe will need to be addressed first to lower her cardiac risk. I discussed with her today the possibility of aortic stent graft repair. However she is not a very good candidate for stent grafting because a large amount of thrombus in the neck of the aorta. She also has a history of medical noncompliance which is a relative contraindication to stent graft repair because of the extensive followup is required. She will followup with me in 4-6 weeks after she has her cardiac bypass grafting procedure. If she is ready to undergo aneurysm repair at that time we will consider it. I will have the films reviewed by the [place] company to get their opinion on the thrombus in the neck. Since she does have fairly extensive cardiac history we will need to proceed cautiously if we decide on open repair. However, at this time she prefers open repair if possible.    [name], MD [facility] [place] Office: [phone] Pager: [phone]

## 2011-02-11 ENCOUNTER — Encounter: Payer: Medicare Other | Admitting: Physician Assistant

## 2011-02-11 VITALS — BP 136/82 | HR 70 | Resp 16 | Ht 64.0 in | Wt 188.0 lb

## 2011-02-11 NOTE — Progress Notes (Deleted)
PCP is Gwynneth Aliment, MD Referring Provider is Allred, Quita Skye, MD  Chief Complaint  Patient presents with  . Routine Post Op    1 wk f/u to re-evaluate medical issues----abdominal aneurysm(to see Dr. Hart Rochester) and cardiologist to manage her hypertension    HPI:   Past Medical History  Diagnosis Date  . 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 HTN (hypertension)   . Diabetes mellitus   . Glaucoma   . Noncompliance   . CAD (coronary artery disease)     LHC 01/04/11: dLM 20%, pLAD 90%, oD1 80%, RCA 80%, EF 35-40%.  . Ischemic cardiomyopathy   . Chronic systolic heart failure   . AAA (abdominal aortic aneurysm)     u/s 01/07/11: 5.3x5.5 cm infrarenal fusiform AAA.  Marland Kitchen Renal artery stenosis     at [facility] 11/12: prox left RA 80% stenosis  . HLD (hyperlipidemia)   . Chest pain 01/28/11  . SOB (shortness of breath) 01/28/11    Past Surgical History  Procedure Date  . Cardiac defibrillator placement     in 1997 with revision to a  Guidant model 1860 single chamber implantable cardioverter- defib on Feb18, 2003, most recent generator change 2/11 by Dr Johney Frame SJM  . Laminectomy May 2008  . Right knee resconstructuion 1970  . Laminectomy 11/20/07     LF5-S1 fusion  . Back surgery     Family History  Problem Relation Age of Onset  . Long QT syndrome Mother   . Long QT syndrome Sister   . Long QT syndrome Brother   . Long QT syndrome Daughter   . Coronary artery disease Sister 29    Social History History  Substance Use Topics  . Smoking status: Current Everyday Smoker -- 0.3 packs/day    Types: Cigarettes  . Smokeless tobacco: Never Used   Comment: Smokes less thatn 1/2 pack per day and has no interest in [place] t this time.   . Alcohol Use: No    Current Outpatient Prescriptions  Medication Sig Dispense Refill  . atorvastatin (LIPITOR) 20 MG tablet Take 20 mg by mouth daily.        . carvedilol (COREG) 12.5 MG tablet Take 2 tablets (25 mg total) by  mouth 2 (two) times daily.  60 tablet  11  . ciprofloxacin (CIPRO) 500 MG tablet Take 500 mg by mouth every 12 (twelve) hours.        Marland Kitchen esomeprazole (NEXIUM) 40 MG capsule Take 40 mg by mouth daily before breakfast.        . furosemide (LASIX) 40 MG tablet Take 1 tablet (40 mg total) by mouth daily.  30 tablet  11  . hydrALAZINE (APRESOLINE) 25 MG tablet Take 2 tablets (50 mg total) by mouth 3 (three) times daily.  90 tablet  3  . losartan (COZAAR) 100 MG tablet Take 100 mg by mouth daily.        . metFORMIN (GLUCOPHAGE) 500 MG tablet Take 500 mg by mouth daily.        . nitroGLYCERIN (NITROSTAT) 0.4 MG SL tablet Place 1 tablet (0.4 mg total) under the tongue every 5 (five) minutes as needed for chest pain. Up to three doses. Call 911 if no relief  25 tablet  3  . omeprazole (PRILOSEC) 40 MG capsule Take 40 mg by mouth daily.        Marland Kitchen PARoxetine (PAXIL) 20 MG tablet Take 20 mg by mouth daily.        Marland Kitchen  zolpidem (AMBIEN) 10 MG tablet Take 10 mg by mouth at bedtime.          Allergies  Allergen Reactions  . Penicillins     Review of Systems  BP 136/82  Pulse 70  Resp 16  Ht 5\' 4"  (1.626 m)  Wt 188 lb (85.276 kg)  BMI 32.27 kg/m2  SpO2 95% Physical Exam   Diagnostic Tests:   Impression:   Plan:

## 2011-02-18 ENCOUNTER — Ambulatory Visit (INDEPENDENT_AMBULATORY_CARE_PROVIDER_SITE_OTHER): Payer: Medicare Other | Admitting: Thoracic Surgery (Cardiothoracic Vascular Surgery)

## 2011-02-18 ENCOUNTER — Other Ambulatory Visit: Payer: Self-pay

## 2011-02-18 ENCOUNTER — Other Ambulatory Visit: Payer: Self-pay | Admitting: Thoracic Surgery (Cardiothoracic Vascular Surgery)

## 2011-02-18 ENCOUNTER — Encounter: Payer: Self-pay | Admitting: Thoracic Surgery (Cardiothoracic Vascular Surgery)

## 2011-02-18 VITALS — BP 139/76 | HR 72 | Resp 18 | Ht 64.0 in | Wt 184.0 lb

## 2011-02-18 DIAGNOSIS — I2510 Atherosclerotic heart disease of native coronary artery without angina pectoris: Secondary | ICD-10-CM

## 2011-02-18 NOTE — Progress Notes (Signed)
[address]            [name] [address]          [phone]     CARDIOTHORACIC SURGERY OFFICE NOTE  Referring Provider is [name], MD PCP is Gwynneth Aliment, MD   HPI:  Patient returns for follow-up of three-vessel coronary artery disease.  She was originally seen in consultation on 01/14/2011 and a full consultation note was dictated at that time. She was found to have a large infrarenal abdominal aortic aneurysm for which she has been seen in consultation by Dr. Darrick Penna. We have scheduled her for elective coronary artery bypass grafting several weeks ago, but the patient developed severe epistaxis associated with severe hypertension for which she was treated in the emergency department. Elective coronary artery bypass surgery was postponed. Since then she has had her blood pressure medications adjusted and she is doing much better. She returns for followup today with hope to schedule elective coronary artery bypass surgery in the near future.  She reports that over the last few weeks she had 1 brief episode of substernal chest discomfort for which she took sublingual nitroglycerin. The pain resolved fairly quickly. She has not had any other episodes. She does report significant exertional shortness of breath. The remainder of her review of systems is unchanged from previously. She has not had any further episodes of epistaxis.   Current Outpatient Prescriptions  Medication Sig Dispense Refill  . atorvastatin (LIPITOR) 20 MG tablet Take 20 mg by mouth daily.        . carvedilol (COREG) 12.5 MG tablet Take 2 tablets (25 mg total) by mouth 2 (two) times daily.  60 tablet  11  . esomeprazole (NEXIUM) 40 MG capsule Take 40 mg by mouth daily before breakfast.        . furosemide (LASIX) 40 MG tablet Take 1 tablet (40 mg total) by mouth daily.  30 tablet  11  . hydrALAZINE (APRESOLINE) 25 MG tablet Take 2 tablets (50 mg total) by mouth 3 (three) times  daily.  90 tablet  3  . losartan (COZAAR) 100 MG tablet Take 100 mg by mouth daily.        . metFORMIN (GLUCOPHAGE) 500 MG tablet Take 500 mg by mouth daily.        . nitroGLYCERIN (NITROSTAT) 0.4 MG SL tablet Place 1 tablet (0.4 mg total) under the tongue every 5 (five) minutes as needed for chest pain. Up to three doses. Call 911 if no relief  25 tablet  3  . omeprazole (PRILOSEC) 40 MG capsule Take 40 mg by mouth daily.        Marland Kitchen PARoxetine (PAXIL) 20 MG tablet Take 20 mg by mouth daily.        Marland Kitchen zolpidem (AMBIEN) 10 MG tablet Take 10 mg by mouth at bedtime.            Physical Exam:   BP 139/76  Pulse 72  Resp 18  Ht 5\' 4"  (1.626 m)  Wt 184 lb (83.462 kg)  BMI 31.58 kg/m2  SpO2 96%  General:  Well-appearing  Chest:   Clear to auscultation  CV:   Regular rate and rhythm  Abdomen:  Soft and nontender  Extremities:  Warm and well-perfused  Diagnostic Tests:  Not applicable   Impression:  Severe three-vessel coronary artery disease with normal left ventricular function. The  patient has a large infrarenal abdominal aortic aneurysm for which she will need surgical intervention once she has recovered from coronary artery bypass surgery. She has numerous other comorbid medical problems as outlined previously.  Plan:  We have again reviewed the indications, risks, and potential benefits of surgery. I've offered to get surgery scheduled sometime this week or early next week by one of my partners.  The patient desires to hold off until I can perform the surgery. We tentatively plan for elective coronary artery bypass grafting on 03/07/2011. I will see the patient back in the office for followup on 03/06/2011. I've asked her that she bring family member to the office for visit for consultation at that time. All of her questions been addressed. She has been instructed to call EMS and go to the emergency room should she develop any substernal chest discomfort unrelieved by sublingual  nitroglycerin between now and the time of surgery.   Salvatore Decent. Cornelius Moras, MD 02/18/2011 3:35 PM

## 2011-02-18 NOTE — Patient Instructions (Signed)
Call EMS in the event that the patient develops substernal chest discomfort unrelieved by the administration of sublingual nitroglycerin

## 2011-02-19 ENCOUNTER — Encounter: Payer: Medicare Other | Admitting: Vascular Surgery

## 2011-02-21 ENCOUNTER — Encounter (HOSPITAL_COMMUNITY): Payer: Self-pay | Admitting: Pharmacy Technician

## 2011-03-05 ENCOUNTER — Other Ambulatory Visit (HOSPITAL_COMMUNITY): Payer: Self-pay | Admitting: Radiology

## 2011-03-05 ENCOUNTER — Inpatient Hospital Stay (HOSPITAL_COMMUNITY)
Admission: RE | Admit: 2011-03-05 | Discharge: 2011-03-05 | Disposition: A | Discharge status: Home / Self Care | Payer: Medicare Other | Source: Ambulatory Visit | Attending: Thoracic Surgery (Cardiothoracic Vascular Surgery) | Admitting: Thoracic Surgery (Cardiothoracic Vascular Surgery)

## 2011-03-05 ENCOUNTER — Ambulatory Visit (HOSPITAL_COMMUNITY)
Admission: RE | Admit: 2011-03-05 | Discharge: 2011-03-05 | Disposition: A | Discharge status: Home / Self Care | Payer: Medicare Other | Source: Ambulatory Visit | Attending: Thoracic Surgery (Cardiothoracic Vascular Surgery) | Admitting: Thoracic Surgery (Cardiothoracic Vascular Surgery)

## 2011-03-05 ENCOUNTER — Encounter (HOSPITAL_COMMUNITY)
Admission: RE | Admit: 2011-03-05 | Discharge: 2011-03-05 | Disposition: A | Discharge status: Home / Self Care | Payer: Medicare Other | Source: Ambulatory Visit | Attending: Thoracic Surgery (Cardiothoracic Vascular Surgery) | Admitting: Thoracic Surgery (Cardiothoracic Vascular Surgery)

## 2011-03-05 ENCOUNTER — Encounter (HOSPITAL_COMMUNITY): Payer: Self-pay

## 2011-03-05 ENCOUNTER — Other Ambulatory Visit: Payer: Self-pay

## 2011-03-05 DIAGNOSIS — I2510 Atherosclerotic heart disease of native coronary artery without angina pectoris: Secondary | ICD-10-CM | POA: Insufficient documentation

## 2011-03-05 DIAGNOSIS — Z01818 Encounter for other preprocedural examination: Secondary | ICD-10-CM | POA: Insufficient documentation

## 2011-03-05 DIAGNOSIS — Z01810 Encounter for preprocedural cardiovascular examination: Secondary | ICD-10-CM

## 2011-03-05 HISTORY — DX: Sleep apnea, unspecified: G47.30

## 2011-03-05 HISTORY — DX: Unspecified convulsions: R56.9

## 2011-03-05 HISTORY — DX: Fibromyalgia: M79.7

## 2011-03-05 HISTORY — DX: Gastro-esophageal reflux disease without esophagitis: K21.9

## 2011-03-05 LAB — CBC
HCT: 35.7 % — ABNORMAL LOW (ref 36.0–46.0)
Hemoglobin: 11.6 g/dL — ABNORMAL LOW (ref 12.0–15.0)
MCHC: 32.5 g/dL (ref 30.0–36.0)
Platelets: 315 10*3/uL (ref 150–400)
RDW: 14.4 % (ref 11.5–15.5)
WBC: 6.6 10*3/uL (ref 4.0–10.5)

## 2011-03-05 LAB — PULMONARY FUNCTION TEST

## 2011-03-05 LAB — BLOOD GAS, ARTERIAL
Bicarbonate: 29.1 mEq/L — ABNORMAL HIGH (ref 20.0–24.0)
TCO2: 30.4 mmol/L (ref 0–100)
pCO2 arterial: 41.2 mmHg (ref 35.0–45.0)
pH, Arterial: 7.463 — ABNORMAL HIGH (ref 7.350–7.400)
pO2, Arterial: 76.1 mmHg — ABNORMAL LOW (ref 80.0–100.0)

## 2011-03-05 LAB — COMPREHENSIVE METABOLIC PANEL
AST: 16 U/L (ref 0–37)
CO2: 26 mEq/L (ref 19–32)
Calcium: 10 mg/dL (ref 8.4–10.5)
Creatinine, Ser: 1.02 mg/dL (ref 0.50–1.10)
GFR calc non Af Amer: 57 mL/min — ABNORMAL LOW (ref >90)
Total Protein: 7.1 g/dL (ref 6.0–8.3)

## 2011-03-05 LAB — URINALYSIS, ROUTINE W REFLEX MICROSCOPIC
Bilirubin Urine: NEGATIVE
Hgb urine dipstick: NEGATIVE
Protein, ur: NEGATIVE mg/dL
Urobilinogen, UA: 0.2 mg/dL (ref 0.0–1.0)

## 2011-03-05 LAB — PROTIME-INR
INR: 1.07 (ref 0.00–1.49)
Prothrombin Time: 14.1 seconds (ref 11.6–15.2)

## 2011-03-05 LAB — HEMOGLOBIN A1C: Mean Plasma Glucose: 126 mg/dL — ABNORMAL HIGH (ref <117)

## 2011-03-05 MED ORDER — ALBUTEROL SULFATE (5 MG/ML) 0.5% IN NEBU
2.5000 mg | INHALATION_SOLUTION | Freq: Once | RESPIRATORY_TRACT | Status: AC
  Administered 2011-03-05: 2.5 mg via RESPIRATORY_TRACT

## 2011-03-05 MED ORDER — CHLORHEXIDINE GLUCONATE 4 % EX LIQD: 30.0000 mL | CUTANEOUS | Status: DC

## 2011-03-05 NOTE — Pre-Procedure Instructions (Addendum)
20 Lori York  03/05/2011   Your procedure is scheduled on: 1.24.13  Report to [facility] at 530 AM.  Call this number if you have problems the morning of surgery: [phone]   Remember:   Do not eat food:After Midnight.  May have clear liquids: up to 4 Hours before arrival.  130 am  Clear liquids include soda, tea, black coffee, apple or grape juice, broth.  Take these medicines the morning of surgery with A SIP OF WATER: coreg nexium, prilosec,nexium ,paxil,hydralazine  STOP nsaids   Do not wear jewelry, make-up or nail polish.  Do not wear lotions, powders, or perfumes. You may wear deodorant.  Do not shave 48 hours prior to surgery.  Do not bring valuables to the hospital.  Contacts, dentures or bridgework may not be worn into surgery.  Leave suitcase in the car. After surgery it may be brought to your room.  For patients admitted to the hospital, checkout time is 11:00 AM the day of discharge.   Patients discharged the day of surgery will not be allowed to drive home.  Name and phone number of your driver: ron [phone]  Special Instructions: Incentive Spirometry - Practice and bring it with you on the day of surgery. and CHG Shower Use Special Wash: 1/2 bottle night before surgery and 1/2 bottle morning of surgery.   Please read over the following fact sheets that you were given: Pain Booklet, Coughing and Deep Breathing, Blood Transfusion Information, Open Heart Packet, MRSA Information and Surgical Site Infection Prevention

## 2011-03-05 NOTE — Progress Notes (Addendum)
Echo ,cath, in epic. Trying to obtain sleep study done 2 yrs ago per patient.? Done at [facility] but they do not have patient in system. Will call dr allred to see if they have a copy. pft's and doppler studies done today. icd protocol iniatiated. faxed to [place]. [employer] called.will notify rep.[employer] REP MICHELLE CALLED AND INFO GIVEN. WILL BE HERE 1.24.13 AROUND 645-700 AM. PMP ROBIN SANDERS CALLED TRYING TO FIND SLEEP STUDY. MESSAGE LEFT .

## 2011-03-05 NOTE — Progress Notes (Signed)
VASCULAR LAB PRELIMINARY  PRELIMINARY  PRELIMINARY  PRELIMINARY  Pre-op Cardiac Surgery  Carotid Findings:  Bilateral:  No evidence of hemodynamically significant internal carotid artery stenosis.   Vertebral artery flow is antegrade.      Upper Extremity Right Left  Brachial Pressures 159 tri 155 tri  Radial Waveforms tri tri  Ulnar Waveforms tri tri  Palmar Arch (Allen's Test) Within normal limits  Doppler decreases >50% with radial compression, normal with ulnar compression    Lower  Extremity Right Left  Dorsalis Pedis 151 mono 159 dampened mono  Anterior Tibial    Posterior Tibial 151 mono 165 mono  Ankle/Brachial Indices 0.95 1.04     Terance Hart, RVT 03/05/2011, 12:10 PM

## 2011-03-06 ENCOUNTER — Ambulatory Visit (INDEPENDENT_AMBULATORY_CARE_PROVIDER_SITE_OTHER): Payer: Medicare Other | Admitting: Thoracic Surgery (Cardiothoracic Vascular Surgery)

## 2011-03-06 ENCOUNTER — Encounter: Payer: Self-pay | Admitting: Thoracic Surgery (Cardiothoracic Vascular Surgery)

## 2011-03-06 ENCOUNTER — Ambulatory Visit: Payer: Medicare Other | Admitting: Thoracic Surgery (Cardiothoracic Vascular Surgery)

## 2011-03-06 VITALS — BP 156/94 | HR 81 | Temp 97.4°F | Resp 16 | Ht 65.0 in | Wt 176.0 lb

## 2011-03-06 DIAGNOSIS — I2510 Atherosclerotic heart disease of native coronary artery without angina pectoris: Secondary | ICD-10-CM

## 2011-03-06 MED ORDER — VERAPAMIL HCL 2.5 MG/ML IV SOLN
INTRAVENOUS | Status: AC
  Administered 2011-03-07: 09:00:00
  Filled 2011-03-06: qty 2.5

## 2011-03-06 MED ORDER — VANCOMYCIN HCL 1000 MG IV SOLR
1250.0000 mg | INTRAVENOUS | Status: AC
  Administered 2011-03-07: 1250 mg via INTRAVENOUS
  Filled 2011-03-06: qty 1250

## 2011-03-06 MED ORDER — POTASSIUM CHLORIDE 2 MEQ/ML IV SOLN
80.0000 meq | INTRAVENOUS | Status: DC
  Filled 2011-03-06: qty 40

## 2011-03-06 MED ORDER — SODIUM CHLORIDE 0.9 % IV SOLN
INTRAVENOUS | Status: AC
  Administered 2011-03-07: 69 mL/h via INTRAVENOUS
  Filled 2011-03-06: qty 40

## 2011-03-06 MED ORDER — PHENYLEPHRINE HCL 10 MG/ML IJ SOLN
30.0000 ug/min | INTRAVENOUS | Status: AC
  Administered 2011-03-07: 20 ug/min via INTRAVENOUS
  Filled 2011-03-06: qty 2

## 2011-03-06 MED ORDER — EPINEPHRINE HCL 1 MG/ML IJ SOLN
0.5000 ug/min | INTRAVENOUS | Status: DC
  Filled 2011-03-06: qty 4

## 2011-03-06 MED ORDER — SODIUM CHLORIDE 0.9 % IV SOLN
0.1000 ug/kg/h | INTRAVENOUS | Status: AC
  Administered 2011-03-07: .2 ug/kg/h via INTRAVENOUS
  Filled 2011-03-06: qty 4

## 2011-03-06 MED ORDER — MOXIFLOXACIN HCL IN NACL 400 MG/250ML IV SOLN
400.0000 mg | INTRAVENOUS | Status: AC
  Administered 2011-03-07: 400 mg via INTRAVENOUS
  Filled 2011-03-06: qty 250

## 2011-03-06 MED ORDER — INSULIN REGULAR HUMAN 100 UNIT/ML IJ SOLN
INTRAMUSCULAR | Status: AC
  Administered 2011-03-07: 2.3 [IU]/h via INTRAVENOUS
  Filled 2011-03-06: qty 1

## 2011-03-06 MED ORDER — METOPROLOL TARTRATE 12.5 MG HALF TABLET: 12.5000 mg | ORAL_TABLET | Freq: Once | ORAL | Status: DC

## 2011-03-06 MED ORDER — DOPAMINE-DEXTROSE 3.2-5 MG/ML-% IV SOLN
2.0000 ug/kg/min | INTRAVENOUS | Status: AC
  Administered 2011-03-07: 3 ug/kg/min via INTRAVENOUS
  Filled 2011-03-06: qty 250

## 2011-03-06 MED ORDER — MAGNESIUM SULFATE 50 % IJ SOLN
40.0000 meq | INTRAMUSCULAR | Status: DC
  Filled 2011-03-06: qty 10

## 2011-03-06 MED ORDER — NITROGLYCERIN IN D5W 200-5 MCG/ML-% IV SOLN
2.0000 ug/min | INTRAVENOUS | Status: AC
  Administered 2011-03-07: 16.6 ug/min via INTRAVENOUS
  Filled 2011-03-06: qty 250

## 2011-03-06 NOTE — H&P (Signed)
CARDIOTHORACIC SURGERY HISTORY AND PHYSICAL EXAM  PCP is Gwynneth Aliment, MD, MD Attending physician is Purcell Nails, MD Referring Providers are [name], MD and Gardiner Rhyme, MD   Chief Complaint   Patient presents with   .  Coronary Artery Disease       Referral from Dr Johney Frame for surgical eval on coronary Artery Dx. cath 01/04/11    .  AAA       5.5cm     HPI:  Patient is a 66 year old African American female from [place] with no previous history of coronary artery disease but risk factors notable for history of hypertension, type 2 diabetes mellitus, hyperlipidemia, long-standing tobacco abuse, and previous history of congestive heart failure. The patient is followed by Dr. Johney Frame with history of prolonged QT syndrome and sudden death. She originally had a pacemaker placed in 1997 and this was later exchanged to a defibrillator. She states that her defibrillator fired a couple of times proximally 5 years ago and shocked her back from a potentially lethal arrhythmia. Her most recent defibrillator was replaced in 2010. Over the years she has been noncompliant at times with followup. She recently returned for followup with symptoms of cough, shortness breath and orthopnea. Her blood pressure was reportedly extremely high. A followup echocardiogram was performed demonstrating severe left ventricular dysfunction with ejection fraction 20-25%. She subsequently underwent elective cardiac catheterization last week by Dr. Excell Seltzer. She was found to have severe two-vessel coronary artery disease with severe proximal left anterior descending coronary artery stenosis and moderate to severe left ventricular dysfunction. She was also noted to have an infrarenal abdominal aortic aneurysm. The patient was referred for possible elective surgical revascularization for treatment of her coronary artery disease.  Elective coronary artery bypass grafting was planned in December, but surgery  was postponed when the patient developed severe epistaxis in the setting of severe, uncontrolled hypertension that required nasal packing in the ED.  Medications for hypertension were adjusted and her blood pressure brought under control.  She was also seen in consultation by Dr. Darrick Penna because of her large abdominal aortic aneurysm.  Patient returns for follow-up with plans to proceed for coronary artery bypass grafting tomorrow morning. She was last seen in the office 2 weeks ago. Since then she has remained clinically stable although she did have another episode of substernal chest pain at rest that woke her from her sleep last week. She took 2 sublingual nitroglycerin and the pain ultimately subsided.  She otherwise has remained clinically stable. She does note that she has not had an appetite and she has not been eating as well recently. In addition, she states that she seems to get full fast and she has had loose bowel movements that usually occur early after meals. She wonders if this might all be related to her aneurysm. She has had some vague discomfort across her upper abdomen that seems to be intermittent and positional in nature. She's had some intermittent nausea and vomiting of for the most part she see eating and certainly tolerating liquids well. She has not had any shortness of breath. She has not had any fevers chills or productive cough. The remainder of her review of systems is unchanged from previously.     Past Medical History   Diagnosis  Date   .  Long Q-T syndrome         w fx of sudden cardiac arrest   .  HTN (hypertension)     .  Diabetes mellitus     .  Glaucoma     .  Noncompliance     .  CAD (coronary artery disease)         LHC 01/04/11: dLM 20%, pLAD 90%, oD1 80%, RCA 80%, EF 35-40%.   .  Ischemic cardiomyopathy     .  Chronic systolic heart failure     .  AAA (abdominal aortic aneurysm)         u/s 01/07/11: 5.3x5.5 cm infrarenal fusiform AAA.   Marland Kitchen  Renal artery  stenosis         at [facility] 11/12: prox left RA 80% stenosis   .  HLD (hyperlipidemia)         Past Surgical History   Procedure  Date   .  Cardiac defibrillator placement         in 1997 with revision to a  Guidant model 1860 single chamber implantable cardioverter- defib on Feb18, 2003, most recent generator change 2/11 by Dr Johney Frame [facility]   .  Laminectomy  May 2008   .  Right knee resconstructuion  1970   .  Laminectomy  11/20/07        LF5-S1 fusion       Family History   Problem  Relation  Age of Onset   .  Long QT syndrome  Mother     .  Long QT syndrome  Sister     .  Long QT syndrome  Brother     .  Long QT syndrome  Daughter     .  Coronary artery disease  Sister  66     Social History History   Substance Use Topics   .  Smoking status:  Current Everyday Smoker -- 0.3 packs/day       Types:  Cigarettes   .  Smokeless tobacco:  Never Used     Comment: Smokes less thatn 1/2 pack per day and has no interest in [place] t this time.    .  Alcohol Use:  No         Current Outpatient Prescriptions  Medication  Sig  Dispense  Refill  .  albuterol (PROVENTIL HFA;VENTOLIN HFA) 108 (90 BASE) MCG/ACT inhaler  Inhale 2 puffs into the lungs every 6 (six) hours as needed.        Marland Kitchen  atorvastatin (LIPITOR) 20 MG tablet  Take 20 mg by mouth daily.          .  budesonide-formoterol (SYMBICORT) 80-4.5 MCG/ACT inhaler  Inhale 2 puffs into the lungs. prn        .  carvedilol (COREG) 12.5 MG tablet  Take 25 mg by mouth 2 (two) times daily.        Marland Kitchen  esomeprazole (NEXIUM) 40 MG capsule  Take 40 mg by mouth daily before breakfast.          .  furosemide (LASIX) 40 MG tablet  Take 40 mg by mouth daily.        .  hydrALAZINE (APRESOLINE) 25 MG tablet  Take 50 mg by mouth 3 (three) times daily.        Marland Kitchen  losartan (COZAAR) 100 MG tablet  Take 100 mg by mouth daily.          .  metFORMIN (GLUCOPHAGE) 500 MG tablet  Take 500 mg by mouth daily.          .    nitroGLYCERIN (NITROSTAT) 0.4 MG SL tablet  Place 0.4 mg under the tongue every 5 (five) minutes as needed. For chest pain - Up to three doses. Call 911 if no relief        .  omeprazole (PRILOSEC) 40 MG capsule  Take 40 mg by mouth daily.          Marland Kitchen  PARoxetine (PAXIL) 20 MG tablet  Take 20 mg by mouth daily.          Marland Kitchen  zolpidem (AMBIEN) 10 MG tablet  Take 10 mg by mouth at bedtime as needed. For sleep          Allergies   Allergen  Reactions   .  Penicillins       Review of Systems:             General:                      normal appetite, normal energy, reports losing over 100 lbs in weight over last year since her son committed suicide             Respiratory:    + cough increased over last several days, productive of yellowish sputum, no wheezing, no hemoptysis, no pain with inspiration or cough, + exertional shortness of breath               Cardiac:                       occasional "twinge" of chest pain or tightness, typically associated with stress or after eating, + exertional SOB, no resting SOB, + PND, no orthopnea, no LE edema, no palpitations, 1 episode near syncope on Thanksgiving             GI:                                no difficulty swallowing, no hematochezia, no hematemesis, no melena, no constipation, no diarrhea               GU:                              no dysuria, no urgency, no frequency               Musculoskeletal:         no arthritis, no arthralgia               Vascular:                     no pain suggestive of claudication               Neuro:                         no symptoms suggestive of TIA's, no seizures, no headaches, no peripheral neuropathy               Endocrine:                   Negative, doesn't check sugars             HEENT:                       no loose teeth or painful teeth, some recent vision changes  attributed to glaucoma             Psych:                         no anxiety,+ depression since her son's suicide               Physical Exam:              BP 139/81  Pulse 70  Resp 18  Ht 5\' 4"  (1.626 m)  Wt 184 lb (83.462 kg)  BMI 31.58 kg/m2  SpO2 96%             General:                      chronically  ill-appearing, appears older than stated age             HEENT:                       Unremarkable               Neck:                           no JVD, no bruits, no adenopathy               Chest:                         clear to auscultation, symmetrical breath sounds, no wheezes, no rhonchi               CV:                              RRR, no  murmur               Abdomen:                    Obese, soft, non-tender, no masses               Extremities:                 warm, well-perfused, pulses not palpable at ankle             Rectal/GU                   Deferred             Neuro:                         Grossly non-focal and symmetrical throughout             Skin:                            Clean and dry, no rashes, no breakdown    Diagnostic Tests:  2-D echocardiogram performed 12/27/2010 is reviewed. This demonstrates severe left ventricular dysfunction with ejection fraction estimated 20-25%. There is no significant mitral regurgitation. No other significant abnormalities are noted.  Cardiac catheterization performed by Dr. Excell Seltzer is reviewed. This demonstrates severe 80% proximal stenosis of the left anterior descending coronary artery.  The high-grade stenosis occurs proximal to the takeoff of the diagonal branch and does not involve the diagonal branch.  The terminal portion of the left anterior descending coronary artery appears to be reasonably good target vessel for grafting. The left circumflex coronary artery is free of significant disease. There is codominant coronary circulation. There is long segment 80% stenosis of the mid right coronary artery. The posterior descending coronary artery is somewhat small but appears to be reasonably good target vessels for grafting. There is  moderate left ventricular dysfunction.  Left ventricular ejection fraction is estimated 35-40%. Aortogram of the abdominal aorta demonstrates an infrarenal abdominal aortic aneurysm. There is 80% proximal stenosis of left renal artery near its origin.   CT ANGIOGRAPHY OF ABDOMEN AND PELVIS     Findings: There is eccentric nonocclusive mural thrombus in the visualized descending thoracic aorta and suprarenal abdominal aorta. There is a fusiform infrarenal abdominal aortic aneurysm.   Length of infrarenal neck (from lowest renal artery): 21 mm Number of renal arteries:  Right = 1; Left = 1 Diameter of infrarenal neck:  20 mm Total length of aneurysm:  9 cm Aneurysm ends at aortic bifurcation: No  If no, Distance from aneurysm to bifurcation: 3 cm Greatest aneurysm diameter:  6.3 cmGreatest common iliac artery diameters:  Right =11 mm; Left = 12 mm Diameter of common iliac arteries just above iliac bifurcation: Right = 10 mm; Left = 12 mm Length of common iliac arteries:  Right = 6.3 cm; Left = 5.9 cm   Celiac axis:  Nonocclusive partially calcified ostial plaque, patent distally   Superior mesenteric artery:  Scattered nonocclusive partially calcified plaque over the first 3 cm, patent distally with classic distal branching anatomy   Left renal artery:  Partially calcified ostial plaque resulting at least 75% diameter stenosis over a length of approximately 10 mm, patent distally   Right renal artery:  Partially calcified eccentric ostial plaque resulting in approximately 50% diameter stenosis over a length of 1 cm, patent distally   Inferior mesenteric artery:  Patent, rising from the distal aspect of the aneurysm      1.  6.3 cm infrarenal abdominal aortic aneurysm  2.  Bilateral ostial renal artery stenosis. 3.  Probable cholelithiasis.    Impression:  Severe three-vessel coronary artery disease with unstable angina. The patient also has a large abdominal aortic  aneurysm as well as numerous other comorbid medical problems. Risks of surgery will be increased due to to the comorbid medical problems and in particular the large abdominal aortic aneurysm. The aneurysm is large enough that she could be at risk for acute rupture in the perioperative period following coronary artery bypass surgery. Furthermore, the presence of the aneurysm and affect mesenteric and renal blood flow and put her at further risk for a variety of other complications.  Plan:  We plan to proceed with elective coronary artery bypass grafting first case tomorrow. We will have Dr. Darrick Penna see her in followup prior to hospital discharge to make definitive plans for timing of elective repair of the aortic aneurysm.  I again reviewed the indications, risks, and potential benefits of coronary artery bypass grafting with the patient and her son here in the office this afternoon.  They understand and accept all potential associated risks of surgery including but not limited to risk of death, stroke, myocardial infarction, congestive heart failure, respiratory failure, renal failure, bleeding requiring blood transfusion and/or reexploration, arrhythmia, heart block or bradycardia requiring permanent pacemaker, pneumonia, pleural effusion, wound infection, pulmonary embolus or other thromboembolic complication, chronic pain or other delayed complications. They understand the added risks  related to the presence of a large abdominal aortic aneurysm including the potential for acute rupture, mesenteric ischemia and/or infarction, renal failure, coagulopathy with increased need for blood transfusion, postoperative ileus, possible ischemia to the lower extremities.  All questions answered.    Salvatore Decent. Cornelius Moras, MD 03/06/2011 5:12 PM

## 2011-03-06 NOTE — Consult Note (Signed)
Anesthesia:  Patient is a 66 year old female scheduled for CABG on 03/07/11.  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    She has seen Dr. Darrick Penna at [facility] re: her AAA.  He feels it warrants repair, but since she has symptomatic 3VCAD, he feels her heart should be addressed first.  He is planning to see her 4-6 weeks post-CABG to discuss options.  EKG shows NSR, LVH, cannot r/o septal infarct, T wave abnormality, consider inferior and anterolateral ischemia.  Echo on 12/27/10 showed: - Left ventricle: Diffuse hypokinesis with septal and apical akinesis The cavity size was severely dilated. Systolic function was severely reduced. The estimated ejection fraction was in the range of 20% to 25%. - Pulmonary arteries: PA peak pressure: 36mm Hg (S).  01/04/11 cath (See Notes tab, Op Note) showed: 1. Severe proximal LAD stenosis  2. Severe mid right coronary artery stenosis  3. Nonobstructive left circumflex stenosis  4. Moderately severe segmental left ventricular systolic dysfunction  5. Infrarenal abdominal aortic aneurysm. 6. EF estimated at 35-40%.  Labs and CXR noted.  She is actually seeing Dr. Cornelius Moras today to discuss surgery.  Plan to proceed if no significant change in her status.  Her Cardiologist is Dr. Excell Seltzer.  Dr. Johney Frame is her EP Cardiologist.  [facility] has received the peri-operative cardiac device form and the rep has been notified by her PAT RN.

## 2011-03-06 NOTE — Anesthesia Preprocedure Evaluation (Addendum)
Anesthesia Evaluation  Patient identified by MRN, date of birth, ID band Patient awake    Reviewed: Allergy & Precautions, H&P , NPO status   Airway Mallampati: II TM Distance: >3 FB Neck ROM: Full    Dental  (+) Edentulous Upper and Dental Advisory Given   Pulmonary shortness of breath, asthma , sleep apnea ,  clear to auscultation        Cardiovascular hypertension, + angina + CAD + dysrhythmias + Cardiac Defibrillator Regular Normal 3V CAD, ischemic CM, EF 20-25% (echo11/2012), [employer] AICD, prolong QT syndrome 6.3 cm AAA   Neuro/Psych Seizures -,   Neuromuscular disease    GI/Hepatic GERD-  ,  Endo/Other  Diabetes mellitus-  Renal/GU Bilateral renal artery stenosis by CT     Musculoskeletal  (+) Fibromyalgia -  Abdominal   Peds  Hematology   Anesthesia Other Findings   Reproductive/Obstetrics                       Anesthesia Physical Anesthesia Plan  ASA: III  Anesthesia Plan: General   Post-op Pain Management:    Induction: Intravenous  Airway Management Planned: Oral ETT  Additional Equipment: Arterial line, Ultrasound Guidance Line Placement, PA Cath and TEE  Intra-op Plan:   Post-operative Plan: Post-operative intubation/ventilation  Informed Consent: I have reviewed the patients History and Physical, chart, labs and discussed the procedure including the risks, benefits and alternatives for the proposed anesthesia with the patient or authorized representative who has indicated his/her understanding and acceptance.   Dental advisory given  Plan Discussed with:   Anesthesia Plan Comments: (3V CAD with LV dysfunction, EF 30-35% Long Q-T Syndrome with AICD AAA DM)        Anesthesia Quick Evaluation

## 2011-03-06 NOTE — Progress Notes (Signed)
[address]            [place] [address]          [phone]     CARDIOTHORACIC SURGERY OFFICE NOTE  Referring Provider is Lori Bollman, MD PCP is Lori Aliment, MD, MD   HPI:  Patient returns for follow-up with plans to proceed for coronary artery bypass grafting tomorrow morning. She was last seen here in the office 2 weeks ago. Since then she has remained clinically stable although she did have another episode of substernal chest pain at rest that woke her from her sleep last week. She took 2 sublingual nitroglycerin and the pain ultimately subsided.  She otherwise has remained clinically stable. She does note that she has not had an appetite and she has not been eating as well recently. In addition, she states that she seems to get full fast and she has had loose bowel movements that usually occur early after meals. She wonders if this might all be related to her aneurysm. She has had some vague discomfort across her upper abdomen that seems to be intermittent and positional in nature. She's had some intermittent nausea and vomiting of for the most part she see eating and certainly tolerating liquids well. She has not had any shortness of breath. She has not had any fevers chills or productive cough. The remainder of her review of systems is unchanged from previously.   Current Outpatient Prescriptions  Medication Sig Dispense Refill  . albuterol (PROVENTIL HFA;VENTOLIN HFA) 108 (90 BASE) MCG/ACT inhaler Inhale 2 puffs into the lungs every 6 (six) hours as needed.      Marland Kitchen atorvastatin (LIPITOR) 20 MG tablet Take 20 mg by mouth daily.        . budesonide-formoterol (SYMBICORT) 80-4.5 MCG/ACT inhaler Inhale 2 puffs into the lungs. prn      . carvedilol (COREG) 12.5 MG tablet Take 25 mg by mouth 2 (two) times daily.      Marland Kitchen esomeprazole (NEXIUM) 40 MG capsule Take 40 mg by mouth daily before breakfast.        . furosemide (LASIX) 40 MG tablet Take 40  mg by mouth daily.      . hydrALAZINE (APRESOLINE) 25 MG tablet Take 50 mg by mouth 3 (three) times daily.      Marland Kitchen losartan (COZAAR) 100 MG tablet Take 100 mg by mouth daily.        . metFORMIN (GLUCOPHAGE) 500 MG tablet Take 500 mg by mouth daily.        . nitroGLYCERIN (NITROSTAT) 0.4 MG SL tablet Place 0.4 mg under the tongue every 5 (five) minutes as needed. For chest pain - Up to three doses. Call 911 if no relief      . omeprazole (PRILOSEC) 40 MG capsule Take 40 mg by mouth daily.        Marland Kitchen PARoxetine (PAXIL) 20 MG tablet Take 20 mg by mouth daily.        Marland Kitchen zolpidem (AMBIEN) 10 MG tablet Take 10 mg by mouth at bedtime as needed. For sleep        No current facility-administered medications for this visit.   Facility-Administered Medications Ordered in Other Visits  Medication Dose Route Frequency Provider Last Rate Last Dose  . aminocaproic acid (AMICAR) 10 g in sodium chloride 0.9 % 100 mL infusion   Intravenous To OR Lori York  Lori Moras, MD      . dexmedetomidine (PRECEDEX) 400 mcg in sodium chloride 0.9 % 100 mL infusion  0.1-0.7 mcg/kg/hr Intravenous To OR Lori Nails, MD      . DOPamine (INTROPIN) 800 mg in dextrose 5 % 250 mL infusion  2-20 mcg/kg/min Intravenous To OR Lori Nails, MD      . EPINEPHrine (ADRENALIN) 4,000 mcg in dextrose 5 % 250 mL infusion  0.5-20 mcg/min Intravenous To OR Lori Nails, MD      . insulin regular (NOVOLIN R,HUMULIN R) 1 Units/mL in sodium chloride 0.9 % 100 mL infusion   Intravenous To OR Lori Nails, MD      . magnesium sulfate (IV Push/IM) injection 40 mEq  40 mEq Other To OR Lori Nails, MD      . metoprolol tartrate (LOPRESSOR) tablet 12.5 mg  12.5 mg Oral Once Lori Nails, MD      . moxifloxacin (AVELOX) IVPB 400 mg  400 mg Intravenous To OR Lori Nails, MD      . nitroGLYCERIN 0.2 mg/mL in dextrose 5 % infusion  2-200 mcg/min Intravenous To OR Lori Nails, MD      . nitroglycerin/verapamil/heparin/sodium bicarbonate  solution irrigation for artery spasm   Irrigation To OR Lori Nails, MD      . phenylephrine (NEO-SYNEPHRINE) 20,000 mcg in dextrose 5 % 250 mL infusion  30-200 mcg/min Intravenous To OR Lori Nails, MD      . potassium chloride injection 80 mEq  80 mEq Other To OR Lori Nails, MD      . vancomycin (VANCOCIN) 1,250 mg in sodium chloride 0.9 % 250 mL IVPB  1,250 mg Intravenous To OR Lori Nails, MD      . DISCONTD: chlorhexidine (HIBICLENS) 4 % liquid 2 application  30 mL Topical UD Lori Nails, MD          Physical Exam:   BP 156/94  Pulse 81  Temp 97.4 F (36.3 C)  Resp 16  Ht 5\' 5"  (1.651 m)  Wt 176 lb (79.833 kg)  BMI 29.29 kg/m2  SpO2 96%  General:  Well-appearing  Chest:   Clear to auscultation  CV:   Regular rate and rhythm  Abdomen:  Soft nondistended nontender. Pulsatile mass can be appreciated  Extremities:  Warm and well-perfused  Diagnostic Tests:  All preoperative laboratory data are reviewed. There are no alarming findings   Impression:  Severe three-vessel coronary artery disease with unstable angina. The patient also has a large abdominal aortic aneurysm as well as numerous other comorbid medical problems. Risks of surgery will be increased due to to the comorbid medical problems and in particular the large abdominal aortic aneurysm. The aneurysm is large enough that she could be at risk for acute rupture in the perioperative period following coronary artery bypass surgery. Furthermore, the presence of the aneurysm and affect mesenteric and renal blood flow and put her at further risk for a variety of other complications.  Plan:  We plan to proceed with elective coronary artery bypass grafting first case tomorrow. We will have Dr. Darrick York see her in followup prior to hospital discharge to make definitive plans for timing of elective repair of the aortic aneurysm.  I again reviewed the indications, risks, and potential benefits of coronary artery  bypass grafting with the patient and her son here in the office this afternoon.  They understand and accept all potential associated risks of  surgery including but not limited to risk of death, stroke, myocardial infarction, congestive heart failure, respiratory failure, renal failure, bleeding requiring blood transfusion and/or reexploration, arrhythmia, heart block or bradycardia requiring permanent pacemaker, pneumonia, pleural effusion, wound infection, pulmonary embolus or other thromboembolic complication, chronic pain or other delayed complications.  They understand the added risks related to the presence of a large abdominal aortic aneurysm including the potential for acute rupture, mesenteric ischemia and/or infarction, renal failure, coagulopathy with increased need for blood transfusion, postoperative ileus, possible ischemia to the lower extremities.  All questions answered.    Lori York. Lori Moras, MD 03/06/2011 5:12 PM

## 2011-03-07 ENCOUNTER — Ambulatory Visit (HOSPITAL_COMMUNITY): Payer: Medicare Other

## 2011-03-07 ENCOUNTER — Encounter (HOSPITAL_COMMUNITY): Payer: Self-pay | Admitting: *Deleted

## 2011-03-07 ENCOUNTER — Encounter (HOSPITAL_COMMUNITY): Payer: Self-pay | Admitting: Vascular Surgery

## 2011-03-07 ENCOUNTER — Ambulatory Visit (HOSPITAL_COMMUNITY): Payer: Medicare Other | Admitting: Vascular Surgery

## 2011-03-07 ENCOUNTER — Encounter (HOSPITAL_COMMUNITY)
Admission: RE | Disposition: A | Discharge status: Home / Self Care | Payer: Self-pay | Source: Ambulatory Visit | Attending: Thoracic Surgery (Cardiothoracic Vascular Surgery)

## 2011-03-07 ENCOUNTER — Inpatient Hospital Stay (HOSPITAL_COMMUNITY): Payer: Medicare Other

## 2011-03-07 ENCOUNTER — Other Ambulatory Visit: Payer: Self-pay

## 2011-03-07 ENCOUNTER — Inpatient Hospital Stay (HOSPITAL_COMMUNITY)
Admission: RE | Admit: 2011-03-07 | Discharge: 2011-03-14 | DRG: 236 | Disposition: A | Discharge status: Home / Self Care | Payer: Medicare Other | Source: Ambulatory Visit | Attending: Thoracic Surgery (Cardiothoracic Vascular Surgery) | Admitting: Thoracic Surgery (Cardiothoracic Vascular Surgery)

## 2011-03-07 DIAGNOSIS — Z7982 Long term (current) use of aspirin: Secondary | ICD-10-CM

## 2011-03-07 DIAGNOSIS — Z8674 Personal history of sudden cardiac arrest: Secondary | ICD-10-CM

## 2011-03-07 DIAGNOSIS — K219 Gastro-esophageal reflux disease without esophagitis: Secondary | ICD-10-CM | POA: Diagnosis present

## 2011-03-07 DIAGNOSIS — Z95810 Presence of automatic (implantable) cardiac defibrillator: Secondary | ICD-10-CM

## 2011-03-07 DIAGNOSIS — I714 Abdominal aortic aneurysm, without rupture, unspecified: Secondary | ICD-10-CM | POA: Diagnosis present

## 2011-03-07 DIAGNOSIS — Z79899 Other long term (current) drug therapy: Secondary | ICD-10-CM

## 2011-03-07 DIAGNOSIS — I10 Essential (primary) hypertension: Secondary | ICD-10-CM | POA: Diagnosis present

## 2011-03-07 DIAGNOSIS — I509 Heart failure, unspecified: Secondary | ICD-10-CM | POA: Diagnosis present

## 2011-03-07 DIAGNOSIS — N289 Disorder of kidney and ureter, unspecified: Secondary | ICD-10-CM | POA: Diagnosis not present

## 2011-03-07 DIAGNOSIS — I498 Other specified cardiac arrhythmias: Secondary | ICD-10-CM | POA: Diagnosis not present

## 2011-03-07 DIAGNOSIS — Z951 Presence of aortocoronary bypass graft: Secondary | ICD-10-CM

## 2011-03-07 DIAGNOSIS — Z01812 Encounter for preprocedural laboratory examination: Secondary | ICD-10-CM

## 2011-03-07 DIAGNOSIS — E785 Hyperlipidemia, unspecified: Secondary | ICD-10-CM | POA: Diagnosis present

## 2011-03-07 DIAGNOSIS — D62 Acute posthemorrhagic anemia: Secondary | ICD-10-CM | POA: Diagnosis not present

## 2011-03-07 DIAGNOSIS — I2510 Atherosclerotic heart disease of native coronary artery without angina pectoris: Principal | ICD-10-CM

## 2011-03-07 DIAGNOSIS — E119 Type 2 diabetes mellitus without complications: Secondary | ICD-10-CM | POA: Diagnosis present

## 2011-03-07 DIAGNOSIS — F17200 Nicotine dependence, unspecified, uncomplicated: Secondary | ICD-10-CM | POA: Diagnosis present

## 2011-03-07 DIAGNOSIS — I200 Unstable angina: Secondary | ICD-10-CM | POA: Diagnosis present

## 2011-03-07 DIAGNOSIS — I429 Cardiomyopathy, unspecified: Secondary | ICD-10-CM

## 2011-03-07 DIAGNOSIS — I5022 Chronic systolic (congestive) heart failure: Secondary | ICD-10-CM | POA: Diagnosis present

## 2011-03-07 DIAGNOSIS — I2589 Other forms of chronic ischemic heart disease: Secondary | ICD-10-CM | POA: Diagnosis present

## 2011-03-07 HISTORY — PX: CORONARY ARTERY BYPASS GRAFT: SHX141

## 2011-03-07 LAB — POCT I-STAT 4, (NA,K, GLUC, HGB,HCT)
Glucose, Bld: 135 mg/dL — ABNORMAL HIGH (ref 70–99)
Glucose, Bld: 114 mg/dL — ABNORMAL HIGH (ref 70–99)
Glucose, Bld: 123 mg/dL — ABNORMAL HIGH (ref 70–99)
Glucose, Bld: 180 mg/dL — ABNORMAL HIGH (ref 70–99)
Glucose, Bld: 110 mg/dL — ABNORMAL HIGH (ref 70–99)
HCT: 31 % — ABNORMAL LOW (ref 36.0–46.0)
HCT: 22 % — ABNORMAL LOW (ref 36.0–46.0)
HCT: 29 % — ABNORMAL LOW (ref 36.0–46.0)
HCT: 24 % — ABNORMAL LOW (ref 36.0–46.0)
Hemoglobin: 10.5 g/dL — ABNORMAL LOW (ref 12.0–15.0)
Hemoglobin: 7.5 g/dL — ABNORMAL LOW (ref 12.0–15.0)
Hemoglobin: 9.9 g/dL — ABNORMAL LOW (ref 12.0–15.0)
Hemoglobin: 7.1 g/dL — ABNORMAL LOW (ref 12.0–15.0)
Potassium: 2.9 mEq/L — ABNORMAL LOW (ref 3.5–5.1)
Potassium: 4.9 mEq/L (ref 3.5–5.1)
Potassium: 3.6 mEq/L (ref 3.5–5.1)
Potassium: 2.9 mEq/L — ABNORMAL LOW (ref 3.5–5.1)
Sodium: 138 mEq/L (ref 135–145)

## 2011-03-07 LAB — CBC
Hemoglobin: 9.3 g/dL — ABNORMAL LOW (ref 12.0–15.0)
MCH: 28.5 pg (ref 26.0–34.0)
MCV: 86.4 fL (ref 78.0–100.0)
MCV: 88.3 fL (ref 78.0–100.0)
Platelets: 136 10*3/uL — ABNORMAL LOW (ref 150–400)
Platelets: 213 10*3/uL (ref 150–400)
RBC: 3.26 MIL/uL — ABNORMAL LOW (ref 3.87–5.11)
RDW: 14.4 % (ref 11.5–15.5)
WBC: 10.1 10*3/uL (ref 4.0–10.5)
WBC: 10.9 10*3/uL — ABNORMAL HIGH (ref 4.0–10.5)

## 2011-03-07 LAB — GLUCOSE, CAPILLARY
Glucose-Capillary: 100 mg/dL — ABNORMAL HIGH (ref 70–99)
Glucose-Capillary: 108 mg/dL — ABNORMAL HIGH (ref 70–99)
Glucose-Capillary: 120 mg/dL — ABNORMAL HIGH (ref 70–99)
Glucose-Capillary: 129 mg/dL — ABNORMAL HIGH (ref 70–99)
Glucose-Capillary: 154 mg/dL — ABNORMAL HIGH (ref 70–99)
Glucose-Capillary: 94 mg/dL (ref 70–99)

## 2011-03-07 LAB — POCT I-STAT 3, ART BLOOD GAS (G3+)
Acid-base deficit: 2 mmol/L (ref 0.0–2.0)
Bicarbonate: 26.1 mEq/L — ABNORMAL HIGH (ref 20.0–24.0)
Bicarbonate: 24.3 mEq/L — ABNORMAL HIGH (ref 20.0–24.0)
O2 Saturation: 97 %
O2 Saturation: 98 %
Patient temperature: 36.1
Patient temperature: 37
Patient temperature: 37.3
TCO2: 28 mmol/L (ref 0–100)
TCO2: 26 mmol/L (ref 0–100)
pCO2 arterial: 35.8 mmHg (ref 35.0–45.0)
pCO2 arterial: 37.7 mmHg (ref 35.0–45.0)
pH, Arterial: 7.483 — ABNORMAL HIGH (ref 7.350–7.400)
pH, Arterial: 7.414 — ABNORMAL HIGH (ref 7.350–7.400)
pH, Arterial: 7.252 — ABNORMAL LOW (ref 7.350–7.400)
pH, Arterial: 7.303 — ABNORMAL LOW (ref 7.350–7.400)
pO2, Arterial: 53 mmHg — ABNORMAL LOW (ref 80.0–100.0)

## 2011-03-07 LAB — MAGNESIUM: Magnesium: 2.9 mg/dL — ABNORMAL HIGH (ref 1.5–2.5)

## 2011-03-07 LAB — APTT: aPTT: 33 seconds (ref 24–37)

## 2011-03-07 LAB — POCT I-STAT, CHEM 8
BUN: 14 mg/dL (ref 6–23)
Calcium, Ion: 1.17 mmol/L (ref 1.12–1.32)
Chloride: 107 mEq/L (ref 96–112)
Creatinine, Ser: 1.2 mg/dL — ABNORMAL HIGH (ref 0.50–1.10)

## 2011-03-07 LAB — CREATININE, SERUM
Creatinine, Ser: 1.16 mg/dL — ABNORMAL HIGH (ref 0.50–1.10)
GFR calc Af Amer: 57 mL/min — ABNORMAL LOW (ref >90)

## 2011-03-07 SURGERY — CORONARY ARTERY BYPASS GRAFTING (CABG)
Anesthesia: General | Site: Chest | Wound class: Clean

## 2011-03-07 MED ORDER — BISACODYL 5 MG PO TBEC
10.0000 mg | DELAYED_RELEASE_TABLET | Freq: Every day | ORAL | Status: DC
  Administered 2011-03-08 – 2011-03-11 (×4): 10 mg via ORAL
  Filled 2011-03-07 (×4): qty 2

## 2011-03-07 MED ORDER — SODIUM CHLORIDE 0.9 % IJ SOLN: 3.0000 mL | INTRAMUSCULAR | Status: DC | PRN

## 2011-03-07 MED ORDER — METOPROLOL TARTRATE 12.5 MG HALF TABLET
12.5000 mg | ORAL_TABLET | Freq: Two times a day (BID) | ORAL | Status: DC
  Administered 2011-03-08 – 2011-03-09 (×2): 12.5 mg via ORAL
  Filled 2011-03-07 (×5): qty 1

## 2011-03-07 MED ORDER — MOXIFLOXACIN HCL IN NACL 400 MG/250ML IV SOLN
400.0000 mg | INTRAVENOUS | Status: AC
Start: 2011-03-08 — End: 2011-03-08
  Administered 2011-03-08: 400 mg via INTRAVENOUS
  Filled 2011-03-07: qty 250

## 2011-03-07 MED ORDER — INSULIN REGULAR HUMAN 100 UNIT/ML IJ SOLN
INTRAMUSCULAR | Status: DC
  Filled 2011-03-07: qty 1

## 2011-03-07 MED ORDER — VANCOMYCIN HCL 1000 MG IV SOLR
1000.0000 mg | Freq: Once | INTRAVENOUS | Status: AC
  Administered 2011-03-07: 1000 mg via INTRAVENOUS
  Filled 2011-03-07: qty 1000

## 2011-03-07 MED ORDER — OXYCODONE HCL 5 MG PO TABS
5.0000 mg | ORAL_TABLET | ORAL | Status: DC | PRN
  Administered 2011-03-08 – 2011-03-12 (×3): 5 mg via ORAL
  Administered 2011-03-14: 10 mg via ORAL
  Filled 2011-03-07 (×3): qty 1
  Filled 2011-03-07: qty 2

## 2011-03-07 MED ORDER — ALBUMIN HUMAN 5 % IV SOLN
INTRAVENOUS | Status: DC | PRN
  Administered 2011-03-07: 12:00:00 via INTRAVENOUS

## 2011-03-07 MED ORDER — PROTAMINE SULFATE 10 MG/ML IV SOLN
INTRAVENOUS | Status: DC | PRN
  Administered 2011-03-07: 290 mg via INTRAVENOUS

## 2011-03-07 MED ORDER — VECURONIUM BROMIDE 10 MG IV SOLR
INTRAVENOUS | Status: DC | PRN
  Administered 2011-03-07: 5 mg via INTRAVENOUS
  Administered 2011-03-07: 2 mg via INTRAVENOUS
  Administered 2011-03-07: 3 mg via INTRAVENOUS

## 2011-03-07 MED ORDER — SODIUM CHLORIDE 0.9 % IJ SOLN
OROMUCOSAL | Status: DC | PRN
  Administered 2011-03-07: 09:00:00 via TOPICAL

## 2011-03-07 MED ORDER — SODIUM CHLORIDE 0.9 % IV SOLN: 1.0000 mL/kg/h | INTRAVENOUS | Status: DC

## 2011-03-07 MED ORDER — SODIUM CHLORIDE 0.9 % IJ SOLN
3.0000 mL | Freq: Two times a day (BID) | INTRAMUSCULAR | Status: DC
  Administered 2011-03-08 – 2011-03-11 (×8): 3 mL via INTRAVENOUS

## 2011-03-07 MED ORDER — ACETAMINOPHEN 160 MG/5ML PO SOLN: 975.0000 mg | Freq: Four times a day (QID) | ORAL | Status: DC

## 2011-03-07 MED ORDER — ACETAMINOPHEN 325 MG PO TABS: 650.0000 mg | ORAL_TABLET | ORAL | Status: DC | PRN

## 2011-03-07 MED ORDER — POTASSIUM CHLORIDE 10 MEQ/50ML IV SOLN
10.0000 meq | INTRAVENOUS | Status: AC
  Administered 2011-03-07 (×3): 10 meq via INTRAVENOUS

## 2011-03-07 MED ORDER — SODIUM CHLORIDE 0.9 % IV SOLN
INTRAVENOUS | Status: DC
  Administered 2011-03-07: 20 mL via INTRAVENOUS

## 2011-03-07 MED ORDER — INSULIN ASPART 100 UNIT/ML ~~LOC~~ SOLN
0.0000 [IU] | SUBCUTANEOUS | Status: DC
  Administered 2011-03-08 (×2): 2 [IU] via SUBCUTANEOUS
  Filled 2011-03-07: qty 3

## 2011-03-07 MED ORDER — HETASTARCH-ELECTROLYTES 6 % IV SOLN
INTRAVENOUS | Status: DC | PRN
  Administered 2011-03-07: 12:00:00 via INTRAVENOUS

## 2011-03-07 MED ORDER — ROCURONIUM BROMIDE 100 MG/10ML IV SOLN
INTRAVENOUS | Status: DC | PRN
  Administered 2011-03-07: 30 mg via INTRAVENOUS
  Administered 2011-03-07: 50 mg via INTRAVENOUS

## 2011-03-07 MED ORDER — POTASSIUM CHLORIDE 10 MEQ/50ML IV SOLN
10.0000 meq | Freq: Once | INTRAVENOUS | Status: AC
  Administered 2011-03-07: 10 meq via INTRAVENOUS

## 2011-03-07 MED ORDER — LACTATED RINGERS IV SOLN: INTRAVENOUS | Status: DC

## 2011-03-07 MED ORDER — OXYCODONE-ACETAMINOPHEN 5-325 MG PO TABS: 1.0000 | ORAL_TABLET | ORAL | Status: DC | PRN

## 2011-03-07 MED ORDER — HEPARIN SODIUM (PORCINE) 1000 UNIT/ML IJ SOLN
INTRAMUSCULAR | Status: DC | PRN
  Administered 2011-03-07: 2000 [IU] via INTRAVENOUS
  Administered 2011-03-07: 37000 [IU] via INTRAVENOUS

## 2011-03-07 MED ORDER — ACETAMINOPHEN 160 MG/5ML PO SOLN: 650.0000 mg | ORAL | Status: AC

## 2011-03-07 MED ORDER — SODIUM CHLORIDE 0.9 % IJ SOLN: 3.0000 mL | Freq: Two times a day (BID) | INTRAMUSCULAR | Status: DC

## 2011-03-07 MED ORDER — SODIUM CHLORIDE 0.9 % IV SOLN: 250.0000 mL | INTRAVENOUS | Status: DC

## 2011-03-07 MED ORDER — 0.9 % SODIUM CHLORIDE (POUR BTL) OPTIME
TOPICAL | Status: DC | PRN
  Administered 2011-03-07: 5000 mL

## 2011-03-07 MED ORDER — ALBUMIN HUMAN 5 % IV SOLN
250.0000 mL | INTRAVENOUS | Status: AC | PRN
  Administered 2011-03-07: 250 mL via INTRAVENOUS

## 2011-03-07 MED ORDER — METOPROLOL TARTRATE 12.5 MG HALF TABLET: 12.5000 mg | ORAL_TABLET | Freq: Once | ORAL | Status: DC

## 2011-03-07 MED ORDER — METOPROLOL TARTRATE 1 MG/ML IV SOLN: 2.5000 mg | INTRAVENOUS | Status: DC | PRN

## 2011-03-07 MED ORDER — SODIUM CHLORIDE 0.45 % IV SOLN
INTRAVENOUS | Status: DC
  Administered 2011-03-07: 20 mL via INTRAVENOUS

## 2011-03-07 MED ORDER — BISACODYL 10 MG RE SUPP: 10.0000 mg | Freq: Every day | RECTAL | Status: DC

## 2011-03-07 MED ORDER — METOPROLOL TARTRATE 25 MG/10 ML ORAL SUSPENSION: 12.5000 mg | Freq: Two times a day (BID) | ORAL | Status: DC

## 2011-03-07 MED ORDER — PAROXETINE HCL 20 MG PO TABS
20.0000 mg | ORAL_TABLET | Freq: Every day | ORAL | Status: DC
  Administered 2011-03-08 – 2011-03-14 (×7): 20 mg via ORAL
  Filled 2011-03-07 (×8): qty 1

## 2011-03-07 MED ORDER — MORPHINE SULFATE 2 MG/ML IJ SOLN
1.0000 mg | INTRAMUSCULAR | Status: AC | PRN
  Administered 2011-03-07 (×2): 2 mg via INTRAVENOUS
  Filled 2011-03-07 (×2): qty 1

## 2011-03-07 MED ORDER — CARVEDILOL 25 MG PO TABS
25.0000 mg | ORAL_TABLET | Freq: Two times a day (BID) | ORAL | Status: DC
  Administered 2011-03-08 – 2011-03-09 (×2): 25 mg via ORAL
  Filled 2011-03-07 (×6): qty 1

## 2011-03-07 MED ORDER — FENTANYL CITRATE 0.05 MG/ML IJ SOLN
INTRAMUSCULAR | Status: DC | PRN
  Administered 2011-03-07 (×2): 250 ug via INTRAVENOUS
  Administered 2011-03-07: 100 ug via INTRAVENOUS
  Administered 2011-03-07: 250 ug via INTRAVENOUS
  Administered 2011-03-07: 200 ug via INTRAVENOUS
  Administered 2011-03-07: 250 ug via INTRAVENOUS
  Administered 2011-03-07: 150 ug via INTRAVENOUS
  Administered 2011-03-07: 50 ug via INTRAVENOUS

## 2011-03-07 MED ORDER — ONDANSETRON HCL 4 MG/2ML IJ SOLN: 4.0000 mg | Freq: Four times a day (QID) | INTRAMUSCULAR | Status: DC | PRN

## 2011-03-07 MED ORDER — DOCUSATE SODIUM 100 MG PO CAPS
200.0000 mg | ORAL_CAPSULE | Freq: Every day | ORAL | Status: DC
  Administered 2011-03-08 – 2011-03-11 (×4): 200 mg via ORAL
  Filled 2011-03-07 (×7): qty 2

## 2011-03-07 MED ORDER — PROPOFOL 10 MG/ML IV EMUL
INTRAVENOUS | Status: DC | PRN
  Administered 2011-03-07: 50 mg via INTRAVENOUS

## 2011-03-07 MED ORDER — PHENYLEPHRINE HCL 10 MG/ML IJ SOLN
0.0000 ug/min | INTRAVENOUS | Status: DC
  Filled 2011-03-07: qty 2

## 2011-03-07 MED ORDER — SODIUM CHLORIDE 0.9 % IV SOLN
0.1000 ug/kg/h | INTRAVENOUS | Status: DC
  Administered 2011-03-07: 0.7 ug/kg/h via INTRAVENOUS
  Filled 2011-03-07 (×2): qty 2

## 2011-03-07 MED ORDER — CALCIUM CHLORIDE 10 % IV SOLN: 1.0000 g | Freq: Once | INTRAVENOUS | Status: AC | PRN

## 2011-03-07 MED ORDER — ALBUTEROL SULFATE HFA 108 (90 BASE) MCG/ACT IN AERS
2.0000 | INHALATION_SPRAY | Freq: Four times a day (QID) | RESPIRATORY_TRACT | Status: DC | PRN
  Filled 2011-03-07: qty 6.7

## 2011-03-07 MED ORDER — DEXTROSE 5 % IV SOLN
0.2500 ug/kg/min | INTRAVENOUS | Status: DC
  Administered 2011-03-07: 0.5 ug/kg/min via INTRAVENOUS
  Filled 2011-03-07: qty 2

## 2011-03-07 MED ORDER — POTASSIUM CHLORIDE 10 MEQ/50ML IV SOLN: 10.0000 meq | INTRAVENOUS | Status: DC

## 2011-03-07 MED ORDER — BUDESONIDE-FORMOTEROL FUMARATE 80-4.5 MCG/ACT IN AERO
2.0000 | INHALATION_SPRAY | Freq: Two times a day (BID) | RESPIRATORY_TRACT | Status: DC
  Administered 2011-03-08 – 2011-03-14 (×13): 2 via RESPIRATORY_TRACT
  Filled 2011-03-07: qty 6.9

## 2011-03-07 MED ORDER — NITROGLYCERIN IN D5W 200-5 MCG/ML-% IV SOLN
0.0000 ug/min | INTRAVENOUS | Status: DC
  Filled 2011-03-07: qty 250

## 2011-03-07 MED ORDER — MIDAZOLAM HCL 5 MG/5ML IJ SOLN
INTRAMUSCULAR | Status: DC | PRN
  Administered 2011-03-07: 2 mg via INTRAVENOUS
  Administered 2011-03-07: 3 mg via INTRAVENOUS
  Administered 2011-03-07: 1 mg via INTRAVENOUS
  Administered 2011-03-07 (×2): 2 mg via INTRAVENOUS

## 2011-03-07 MED ORDER — INSULIN ASPART 100 UNIT/ML ~~LOC~~ SOLN
0.0000 [IU] | SUBCUTANEOUS | Status: AC
  Administered 2011-03-07 (×2): 2 [IU] via SUBCUTANEOUS
  Filled 2011-03-07: qty 3

## 2011-03-07 MED ORDER — MAGNESIUM SULFATE 40 MG/ML IJ SOLN
4.0000 g | Freq: Once | INTRAMUSCULAR | Status: AC
  Administered 2011-03-07: 4 g via INTRAVENOUS
  Filled 2011-03-07: qty 100

## 2011-03-07 MED ORDER — LACTATED RINGERS IV SOLN
INTRAVENOUS | Status: DC | PRN
  Administered 2011-03-07 (×2): via INTRAVENOUS

## 2011-03-07 MED ORDER — ASPIRIN 81 MG PO CHEW: 324.0000 mg | CHEWABLE_TABLET | Freq: Every day | ORAL | Status: DC

## 2011-03-07 MED ORDER — ASPIRIN 81 MG PO CHEW: 81.0000 mg | CHEWABLE_TABLET | Freq: Every day | ORAL | Status: DC

## 2011-03-07 MED ORDER — MORPHINE SULFATE 4 MG/ML IJ SOLN
2.0000 mg | INTRAMUSCULAR | Status: DC | PRN
  Administered 2011-03-08: 4 mg via INTRAVENOUS
  Administered 2011-03-09: 2 mg via INTRAVENOUS
  Filled 2011-03-07 (×2): qty 1

## 2011-03-07 MED ORDER — DOPAMINE-DEXTROSE 3.2-5 MG/ML-% IV SOLN
0.0000 ug/kg/min | INTRAVENOUS | Status: DC
  Filled 2011-03-07: qty 250

## 2011-03-07 MED ORDER — CHLORHEXIDINE GLUCONATE 4 % EX LIQD: 30.0000 mL | CUTANEOUS | Status: DC

## 2011-03-07 MED ORDER — SIMVASTATIN 20 MG PO TABS
20.0000 mg | ORAL_TABLET | Freq: Every day | ORAL | Status: DC
  Administered 2011-03-08 – 2011-03-13 (×6): 20 mg via ORAL
  Filled 2011-03-07 (×8): qty 1

## 2011-03-07 MED ORDER — PANTOPRAZOLE SODIUM 40 MG PO TBEC
40.0000 mg | DELAYED_RELEASE_TABLET | Freq: Every day | ORAL | Status: DC
  Administered 2011-03-09 – 2011-03-13 (×5): 40 mg via ORAL
  Filled 2011-03-07 (×5): qty 1

## 2011-03-07 MED ORDER — INSULIN REGULAR BOLUS VIA INFUSION
0.0000 [IU] | Freq: Three times a day (TID) | INTRAVENOUS | Status: DC
  Filled 2011-03-07 (×5): qty 10

## 2011-03-07 MED ORDER — ACETAMINOPHEN 650 MG RE SUPP: 650.0000 mg | RECTAL | Status: AC

## 2011-03-07 MED ORDER — FAMOTIDINE IN NACL 20-0.9 MG/50ML-% IV SOLN
20.0000 mg | Freq: Two times a day (BID) | INTRAVENOUS | Status: AC
  Administered 2011-03-07: 20 mg via INTRAVENOUS

## 2011-03-07 MED ORDER — MIDAZOLAM HCL 2 MG/2ML IJ SOLN: 2.0000 mg | INTRAMUSCULAR | Status: DC | PRN

## 2011-03-07 MED ORDER — HEMOSTATIC AGENTS (NO CHARGE) OPTIME
TOPICAL | Status: DC | PRN
  Administered 2011-03-07: 1 via TOPICAL

## 2011-03-07 MED ORDER — ACETAMINOPHEN 500 MG PO TABS
1000.0000 mg | ORAL_TABLET | Freq: Four times a day (QID) | ORAL | Status: DC
  Administered 2011-03-08 – 2011-03-12 (×15): 1000 mg via ORAL
  Filled 2011-03-07 (×18): qty 2

## 2011-03-07 MED ORDER — ASPIRIN EC 325 MG PO TBEC
325.0000 mg | DELAYED_RELEASE_TABLET | Freq: Every day | ORAL | Status: DC
  Administered 2011-03-08 – 2011-03-14 (×7): 325 mg via ORAL
  Filled 2011-03-07 (×7): qty 1

## 2011-03-07 MED FILL — Magnesium Sulfate Inj 50%: INTRAMUSCULAR | Qty: 10 | Status: AC

## 2011-03-07 MED FILL — Heparin Sodium (Porcine) Inj 1000 Unit/ML: INTRAMUSCULAR | Qty: 10 | Status: AC

## 2011-03-07 MED FILL — Potassium Chloride Inj 2 mEq/ML: INTRAVENOUS | Qty: 40 | Status: AC

## 2011-03-07 MED FILL — Lactated Ringer's Solution: INTRAVENOUS | Qty: 500 | Status: AC

## 2011-03-07 MED FILL — Nitroglycerin IV Soln 5 MG/ML: INTRAVENOUS | Qty: 10 | Status: AC

## 2011-03-07 MED FILL — Verapamil HCl IV Soln 2.5 MG/ML: INTRAVENOUS | Qty: 4 | Status: AC

## 2011-03-07 SURGICAL SUPPLY — 134 items
ADAPTER CARDIO PERF ANTE/RETRO (ADAPTER) IMPLANT
APPLIER CLIP 9.375 MED OPEN (MISCELLANEOUS)
APPLIER CLIP 9.375 SM OPEN (CLIP)
ATTRACTOMAT 16X20 MAGNETIC DRP (DRAPES) ×2 IMPLANT
BAG DECANTER FOR FLEXI CONT (MISCELLANEOUS) ×2 IMPLANT
BANDAGE ACE 4 STERILE (GAUZE/BANDAGES/DRESSINGS) ×2 IMPLANT
BANDAGE ELASTIC 4 VELCRO ST LF (GAUZE/BANDAGES/DRESSINGS) ×2 IMPLANT
BANDAGE ELASTIC 6 VELCRO ST LF (GAUZE/BANDAGES/DRESSINGS) ×2 IMPLANT
BANDAGE GAUZE ELAST BULKY 4 IN (GAUZE/BANDAGES/DRESSINGS) ×2 IMPLANT
BASKET HEART (ORDER IN 25'S) (MISCELLANEOUS) ×1
BASKET HEART (ORDER IN 25S) (MISCELLANEOUS) ×1 IMPLANT
BENZOIN TINCTURE PRP APPL 2/3 (GAUZE/BANDAGES/DRESSINGS) IMPLANT
BLADE MINI RND TIP GREEN BEAV (BLADE) ×2 IMPLANT
BLADE STERNUM SYSTEM 6 (BLADE) ×2 IMPLANT
BLADE SURG ROTATE 9660 (MISCELLANEOUS) IMPLANT
CANISTER SUCTION 2500CC (MISCELLANEOUS) ×2 IMPLANT
CANNULA AORTIC EASY FLOW (CANNULA) ×2 IMPLANT
CANNULA GUNDRY RCSP 15FR (MISCELLANEOUS) IMPLANT
CATH CPB KIT OWEN (MISCELLANEOUS) IMPLANT
CATH CPB KIT VANTRIGT (MISCELLANEOUS) ×2 IMPLANT
CATH HEART VENT LEFT (CATHETERS) IMPLANT
CATH ROBINSON RED A/P 18FR (CATHETERS) ×4 IMPLANT
CATH THORACIC 28FR (CATHETERS) IMPLANT
CATH THORACIC 28FR RT ANG (CATHETERS) IMPLANT
CATH THORACIC 36FR (CATHETERS) ×2 IMPLANT
CATH THORACIC 36FR RT ANG (CATHETERS) ×2 IMPLANT
CLIP APPLIE 9.375 MED OPEN (MISCELLANEOUS) IMPLANT
CLIP APPLIE 9.375 SM OPEN (CLIP) IMPLANT
CLIP FOGARTY SPRING 6M (CLIP) IMPLANT
CLIP TI MEDIUM 24 (CLIP) IMPLANT
CLIP TI MEDIUM 6 (CLIP) IMPLANT
CLIP TI WIDE RED SMALL 24 (CLIP) ×4 IMPLANT
CLIP TI WIDE RED SMALL 6 (CLIP) IMPLANT
CLOTH BEACON ORANGE TIMEOUT ST (SAFETY) ×2 IMPLANT
CONN Y 3/8X3/8X3/8  BEN (MISCELLANEOUS)
CONN Y 3/8X3/8X3/8 BEN (MISCELLANEOUS) IMPLANT
COVER SURGICAL LIGHT HANDLE (MISCELLANEOUS) ×4 IMPLANT
CRADLE DONUT ADULT HEAD (MISCELLANEOUS) ×2 IMPLANT
DERMABOND ADHESIVE PROPEN (GAUZE/BANDAGES/DRESSINGS) ×1
DERMABOND ADVANCED (GAUZE/BANDAGES/DRESSINGS) ×1
DERMABOND ADVANCED .7 DNX12 (GAUZE/BANDAGES/DRESSINGS) ×1 IMPLANT
DERMABOND ADVANCED .7 DNX6 (GAUZE/BANDAGES/DRESSINGS) ×1 IMPLANT
DRAIN CHANNEL 32F RND 10.7 FF (WOUND CARE) ×2 IMPLANT
DRAPE CARDIOVASCULAR INCISE (DRAPES) ×1
DRAPE SLUSH/WARMER DISC (DRAPES) ×2 IMPLANT
DRAPE SRG 135X102X78XABS (DRAPES) ×1 IMPLANT
DRSG COVADERM 4X14 (GAUZE/BANDAGES/DRESSINGS) ×2 IMPLANT
ELECT REM PT RETURN 9FT ADLT (ELECTROSURGICAL) ×4
ELECTRODE REM PT RTRN 9FT ADLT (ELECTROSURGICAL) ×2 IMPLANT
GLOVE BIO SURGEON STRL SZ 6 (GLOVE) ×4 IMPLANT
GLOVE BIO SURGEON STRL SZ 6.5 (GLOVE) ×6 IMPLANT
GLOVE BIO SURGEON STRL SZ7 (GLOVE) ×2 IMPLANT
GLOVE BIO SURGEON STRL SZ7.5 (GLOVE) IMPLANT
GLOVE BIOGEL PI IND STRL 6 (GLOVE) IMPLANT
GLOVE BIOGEL PI IND STRL 6.5 (GLOVE) ×2 IMPLANT
GLOVE BIOGEL PI IND STRL 7.0 (GLOVE) ×2 IMPLANT
GLOVE BIOGEL PI INDICATOR 6 (GLOVE)
GLOVE BIOGEL PI INDICATOR 6.5 (GLOVE) ×2
GLOVE BIOGEL PI INDICATOR 7.0 (GLOVE) ×2
GLOVE EUDERMIC 7 POWDERFREE (GLOVE) IMPLANT
GLOVE ORTHO TXT STRL SZ7.5 (GLOVE) ×4 IMPLANT
GOWN STRL NON-REIN LRG LVL3 (GOWN DISPOSABLE) ×10 IMPLANT
HEMOSTAT POWDER SURGIFOAM 1G (HEMOSTASIS) ×4 IMPLANT
INSERT FOGARTY 61MM (MISCELLANEOUS) IMPLANT
INSERT FOGARTY XLG (MISCELLANEOUS) ×2 IMPLANT
KIT BASIN OR (CUSTOM PROCEDURE TRAY) ×2 IMPLANT
KIT PAIN CUSTOM (MISCELLANEOUS) IMPLANT
KIT ROOM TURNOVER OR (KITS) ×2 IMPLANT
KIT SUCTION CATH 14FR (SUCTIONS) ×2 IMPLANT
KIT VASOVIEW W/TROCAR VH 2000 (KITS) ×2 IMPLANT
LEAD PACING MYOCARDI (MISCELLANEOUS) ×2 IMPLANT
MARKER GRAFT CORONARY BYPASS (MISCELLANEOUS) ×6 IMPLANT
NS IRRIG 1000ML POUR BTL (IV SOLUTION) ×12 IMPLANT
PACK OPEN HEART (CUSTOM PROCEDURE TRAY) ×2 IMPLANT
PAD ARMBOARD 7.5X6 YLW CONV (MISCELLANEOUS) ×4 IMPLANT
PENCIL BUTTON HOLSTER BLD 10FT (ELECTRODE) ×2 IMPLANT
PUNCH AORTIC ROTATE 4.0MM (MISCELLANEOUS) IMPLANT
PUNCH AORTIC ROTATE 4.5MM 8IN (MISCELLANEOUS) IMPLANT
PUNCH AORTIC ROTATE 5MM 8IN (MISCELLANEOUS) IMPLANT
SET CARDIOPLEGIA MPS 5001102 (MISCELLANEOUS) ×2 IMPLANT
SOLUTION ANTI FOG 6CC (MISCELLANEOUS) IMPLANT
SPONGE GAUZE 4X4 12PLY (GAUZE/BANDAGES/DRESSINGS) ×6 IMPLANT
SPONGE INTESTINAL PEANUT (DISPOSABLE) IMPLANT
SPONGE LAP 18X18 X RAY DECT (DISPOSABLE) IMPLANT
SPONGE LAP 4X18 X RAY DECT (DISPOSABLE) IMPLANT
STOPCOCK 4 WAY LG BORE MALE ST (IV SETS) IMPLANT
STRIP CLOSURE SKIN 1/2X4 (GAUZE/BANDAGES/DRESSINGS) IMPLANT
SUT BONE WAX W31G (SUTURE) ×2 IMPLANT
SUT ETHIBOND X763 2 0 SH 1 (SUTURE) ×4 IMPLANT
SUT MNCRL AB 3-0 PS2 18 (SUTURE) ×4 IMPLANT
SUT MNCRL AB 4-0 PS2 18 (SUTURE) IMPLANT
SUT PDS AB 1 CTX 36 (SUTURE) ×4 IMPLANT
SUT PROLENE 2 0 SH DA (SUTURE) IMPLANT
SUT PROLENE 3 0 SH DA (SUTURE) ×2 IMPLANT
SUT PROLENE 3 0 SH1 36 (SUTURE) IMPLANT
SUT PROLENE 4 0 RB 1 (SUTURE) ×2
SUT PROLENE 4 0 SH DA (SUTURE) IMPLANT
SUT PROLENE 4-0 RB1 .5 CRCL 36 (SUTURE) ×2 IMPLANT
SUT PROLENE 5 0 C 1 36 (SUTURE) IMPLANT
SUT PROLENE 6 0 C 1 30 (SUTURE) ×8 IMPLANT
SUT PROLENE 6 0 CC (SUTURE) IMPLANT
SUT PROLENE 7 0 BV 1 (SUTURE) IMPLANT
SUT PROLENE 7 0 BV1 MDA (SUTURE) IMPLANT
SUT PROLENE 7.0 RB 3 (SUTURE) ×6 IMPLANT
SUT PROLENE 8 0 BV175 6 (SUTURE) ×4 IMPLANT
SUT PROLENE BLUE 7 0 (SUTURE) ×2 IMPLANT
SUT PROLENE POLY MONO (SUTURE) IMPLANT
SUT SILK  1 MH (SUTURE) ×2
SUT SILK 1 MH (SUTURE) ×2 IMPLANT
SUT SILK 2 0 SH CR/8 (SUTURE) IMPLANT
SUT SILK 3 0 SH CR/8 (SUTURE) IMPLANT
SUT STEEL 6MS V (SUTURE) IMPLANT
SUT STEEL STERNAL CCS#1 18IN (SUTURE) ×2 IMPLANT
SUT STEEL SZ 6 DBL 3X14 BALL (SUTURE) ×4 IMPLANT
SUT VIC AB 1 CTX 36 (SUTURE)
SUT VIC AB 1 CTX36XBRD ANBCTR (SUTURE) IMPLANT
SUT VIC AB 2-0 CT1 27 (SUTURE) ×1
SUT VIC AB 2-0 CT1 TAPERPNT 27 (SUTURE) ×1 IMPLANT
SUT VIC AB 2-0 CTX 27 (SUTURE) IMPLANT
SUT VIC AB 3-0 SH 27 (SUTURE)
SUT VIC AB 3-0 SH 27X BRD (SUTURE) IMPLANT
SUT VIC AB 3-0 X1 27 (SUTURE) ×2 IMPLANT
SUT VICRYL 4-0 PS2 18IN ABS (SUTURE) IMPLANT
SUTURE E-PAK OPEN HEART (SUTURE) ×2 IMPLANT
SYSTEM SAHARA CHEST DRAIN ATS (WOUND CARE) ×2 IMPLANT
TAPE CLOTH SURG 4X10 WHT LF (GAUZE/BANDAGES/DRESSINGS) ×4 IMPLANT
TOWEL OR 17X24 6PK STRL BLUE (TOWEL DISPOSABLE) ×2 IMPLANT
TOWEL OR 17X26 10 PK STRL BLUE (TOWEL DISPOSABLE) ×2 IMPLANT
TRAY FOLEY IC TEMP SENS 14FR (CATHETERS) ×2 IMPLANT
TUBE SUCT INTRACARD DLP 20F (MISCELLANEOUS) ×2 IMPLANT
TUBING INSUFFLATION 10FT LAP (TUBING) ×2 IMPLANT
UNDERPAD 30X30 INCONTINENT (UNDERPADS AND DIAPERS) ×2 IMPLANT
VENT LEFT HEART 12002 (CATHETERS)
WATER STERILE IRR 1000ML POUR (IV SOLUTION) ×4 IMPLANT

## 2011-03-07 NOTE — Progress Notes (Signed)
[facility] BRIEF SICU PROGRESS NOTE  Day of Surgery  S/P Procedure(s) (LRB): CORONARY ARTERY BYPASS GRAFTING (CABG) (N/A)   Sedated on vent Stable hemodynamics off all drips Chest tube output minimal UOP adequate Labs okay  Plan: Continue routine early postop  OWEN,CLARENCE H 03/07/2011 3:51 PM

## 2011-03-07 NOTE — Interval H&P Note (Signed)
History and Physical Interval Note:  03/07/2011 7:41 AM  Lori York  has presented today for surgery, with the diagnosis of CAD  The various methods of treatment have been discussed with the patient and family. After consideration of risks, benefits and other options for treatment, the patient has consented to  Procedure(s): CORONARY ARTERY BYPASS GRAFTING (CABG) as a surgical intervention .  The patients' history has been reviewed, patient examined, no change in status, stable for surgery.  I have reviewed the patients' chart and labs.  Questions were answered to the patient's satisfaction.     Lori York H

## 2011-03-07 NOTE — Progress Notes (Signed)
[employer] Representative notified that pt. Is here and will be going to holding area @ 0630.

## 2011-03-07 NOTE — OR Nursing (Signed)
Chest Start [phone].

## 2011-03-07 NOTE — Progress Notes (Signed)
Vent decreased to 40% and RR 4 per SICU weam protocol

## 2011-03-07 NOTE — Anesthesia Postprocedure Evaluation (Signed)
  Anesthesia Post-op Note  Patient: Lori York  Procedure(s) Performed:  CORONARY ARTERY BYPASS GRAFTING (CABG) - Coronary artery bypass graft times three using left internal mammary artery and left leg greater saphenous vein harvested endoscopically  Patient Location: PACU  Anesthesia Type: General  Level of Consciousness: Patient remains intubated per anesthesia plan  Airway and Oxygen Therapy: Patient remains intubated per anesthesia plan  Post-op Pain: none  Post-op Assessment: Post-op Vital signs reviewed and Patient's Cardiovascular Status Stable  Post-op Vital Signs: stable  Complications: No apparent anesthesia complications

## 2011-03-07 NOTE — Brief Op Note (Signed)
                   [address]            [name] [address]          [phone]    03/07/2011  10:50 AM  PATIENT:  Lori York  66 y.o. female  PRE-OPERATIVE DIAGNOSIS:  CAD  POST-OPERATIVE DIAGNOSIS:  Same  PROCEDURE:  Procedure(s): CORONARY ARTERY BYPASS GRAFTING (CABG)x3 (LIMA-LAD; SVG-RCA; SVG-DIAG) EVH- LEFT THIGH  SURGEON:  Surgeon(s): Purcell Nails, MD  PHYSICIAN ASSISTANT: Gershon Crane PA-C  ANESTHESIA:   general  PATIENT CONDITION:  ICU - intubated and hemodynamically stable.  PRE-OPERATIVE WEIGHT: 80kg  COMPLICATIONS- No Known

## 2011-03-07 NOTE — Transfer of Care (Signed)
Immediate Anesthesia Transfer of Care Note  Patient: Lori York  Procedure(s) Performed:  CORONARY ARTERY BYPASS GRAFTING (CABG) - Coronary artery bypass graft times three using left internal mammary artery and left leg greater saphenous vein harvested endoscopically  Patient Location: PACU and SICU  Anesthesia Type: General  Level of Consciousness: sedated and unresponsive  Airway & Oxygen Therapy: Patient remains intubated per anesthesia plan and Patient placed on Ventilator (see vital sign flow sheet for setting)  Post-op Assessment: Report given to PACU RN and Post -op Vital signs reviewed and stable  Post vital signs: Reviewed and stable  Complications: No apparent anesthesia complications

## 2011-03-07 NOTE — Progress Notes (Signed)
  Echocardiogram Echocardiogram Transesophageal has been performed.  Lori York 03/07/2011, 8:49 AM

## 2011-03-07 NOTE — Op Note (Signed)
CARDIOTHORACIC SURGERY OPERATIVE NOTE  Date of Procedure: 03/07/2011  Preoperative Diagnosis:   Severe 2-vessel Coronary Artery Disease  Postoperative Diagnosis:   Same  Procedure:    Coronary Artery Bypass Grafting x 3   Left Internal Mammary Artery to Distal Left Anterior Descending Coronary Artery  Saphenous Vein Graft to Distal Right Coronary Artery  Saphenous Vein Graft to First Diagonal Branch Coronary Artery  Endoscopic Vein Harvest from Left Thigh and Lower Leg  Surgeon: Salvatore Decent. Cornelius Moras, MD  Assistant: Rowe Clack, PA-C  Anesthesia: Kipp Brood, MD  Operative Findings:  Moderate-severe global left ventricular dysfunction (EF < 30%)  Good quality left internal mammary artery conduit  Good quality saphenous vein conduit  Good quality target vessels for grafting    BRIEF CLINICAL NOTE AND INDICATIONS FOR SURGERY  Patient is a 66 year old African American female from [place] with no previous history of coronary artery disease but risk factors notable for history of hypertension, type 2 diabetes mellitus, hyperlipidemia, long-standing tobacco abuse, and previous history of congestive heart failure. The patient is followed by Dr. Johney Frame with history of prolonged QT syndrome and sudden death. She originally had a pacemaker placed in 1997 and this was later exchanged to a defibrillator. She states that her defibrillator fired a couple of times proximally 5 years ago and shocked her back from a potentially lethal arrhythmia. Her most recent defibrillator was replaced in 2010. Over the years she has been noncompliant at times with followup. She recently returned for followup with symptoms of cough, shortness breath and orthopnea. Her blood pressure was reportedly extremely high. A followup echocardiogram was performed demonstrating severe left ventricular dysfunction with ejection fraction 20-25%. She subsequently underwent elective cardiac catheterization last week  by Dr. Excell Seltzer. She was found to have severe two-vessel coronary artery disease with severe proximal left anterior descending coronary artery stenosis and moderate to severe left ventricular dysfunction. She was also noted to have an infrarenal abdominal aortic aneurysm    DETAILS OF THE OPERATIVE PROCEDURE  The patient is brought to the operating room on the above mentioned date and central monitoring was established by the anesthesia team including placement of Swan-Ganz catheter and radial arterial line. The patient is placed in the supine position on the operating table.  Intravenous antibiotics are administered. General endotracheal anesthesia is induced uneventfully. A Foley catheter is placed.  Baseline transesophageal echocardiogram was performed.  Findings were notable for moderate-severe global LV dysfunction with EF < 30%.  The patient's chest, abdomen, both groins, and both lower extremities are prepared and draped in a sterile manner. A time out procedure is performed.  A median sternotomy incision was performed and the left internal mammary artery is dissected from the chest wall and prepared for bypass grafting. The left internal mammary artery is notably good quality conduit. Simultaneously, saphenous vein is obtained from the patient's left thigh using endoscopic vein harvest technique. The saphenous vein is notably good quality conduit. After removal of the saphenous vein, the small surgical incisions in the lower extremity are closed with absorbable suture. Following systemic heparinization, the left internal mammary artery was transected distally noted to have excellent flow.  The pericardium is opened. The ascending aorta is normal in appearance. The ascending aorta and the right atrium are cannulated for cardioplegia bypass.  Adequate heparinization is verified.     The entire pre-bypass portion of the operation was notable for stable hemodynamics.  Cardiopulmonary bypass was  begun and the surface of the heart is inspected. Distal  target vessels are selected for coronary artery bypass grafting. A cardioplegia cannula is placed in the ascending aorta.  A temperature probe was placed in the interventricular septum.  The patient is cooled to 32C systemic temperature.  The aortic cross clamp is applied and cold blood cardioplegia is delivered initially in an antegrade fashion through the aortic root.  Iced saline slush is applied for topical hypothermia.  The initial cardioplegic arrest is rapid with early diastolic arrest.  Repeat doses of cardioplegia are administered intermittently throughout the entire cross clamp portion of the operation through the aortic root and through subsequently placed vein grafts in order to maintain completely flat electrocardiogram and septal myocardial temperature below 15C.  Myocardial protection was felt to be excellent.  The following distal coronary artery bypass grafts were performed:   The distal right coronary artery was grafted using a reversed saphenous vein graft in an end-to-side fashion.  At the site of distal anastomosis the target vessel was good quality and measured approximately 2.0 mm in diameter.  The first diagonal branch of the left anterior descending coronary artery was grafted using a reversed saphenous vein graft in an end-to-side fashion.  At the site of distal anastomosis the target vessel was good quality and measured approximately 1.5 mm in diameter.  The distal left anterior coronary artery was grafted with the left internal mammary artery in an end-to-side fashion.  At the site of distal anastomosis the target vessel was good quality and measured approximately 2.0 mm in diameter.   All proximal vein graft anastomoses were placed directly to the ascending aorta prior to removal of the aortic cross clamp.  The septal myocardial temperature rose rapidly after reperfusion of the left internal mammary artery graft.   The aortic cross clamp was removed after a total cross clamp time of 61 minutes.  All proximal and distal coronary anastomoses were inspected for hemostasis and appropriate graft orientation. Epicardial pacing wires are fixed to the right ventricular outflow tract and to the right atrial appendage. The patient is rewarmed to 37C temperature. The patient is weaned and disconnected from cardiopulmonary bypass.  The patient's rhythm at separation from bypass was sinus.  The patient was weaned from cardioplegic bypass on low dose dopamine. Initially there was some intracoronary air noted on TEE and RV dysfunction, and CPB was resumed for an additional 6 minutes to allow the air to clear.  The patient was then weaned from bypass without any difficulty.  Total cardiopulmonary bypass time for the operation was 85 minutes.  Followup transesophageal echocardiogram performed after separation from bypass revealed no changes from the preoperative exam.  The aortic and venous cannula were removed uneventfully. Protamine was administered to reverse the anticoagulation. The mediastinum and pleural space were inspected for hemostasis and irrigated with saline solution. The mediastinum and the left pleural space were drained using 3 chest tubes placed through separate stab incisions inferiorly.  The soft tissues anterior to the aorta were reapproximated loosely. The sternum is closed with double strength sternal wire. The soft tissues anterior to the sternum were closed in multiple layers and the skin is closed with a running subcuticular skin closure.   The post-bypass portion of the operation was notable for stable rhythm and hemodynamics.   No blood products were administered during the operation.  The patient tolerated the procedure well and is transported to the surgical intensive care in stable condition. There are no intraoperative complications. All sponge instrument and needle counts are verified correct at  completion of the operation.    Salvatore Decent. Cornelius Moras MD 03/07/2011 12:54 PM

## 2011-03-07 NOTE — Op Note (Signed)
Patient Name: ROBBYE DEDE MRN: [id number] Age: 66 y.o. Sex: female  HPI: The patient is a 66 y.o. female presenting with symptoms of cough and shortness of breath. She has a history of long QT syndrome and sudden death and has defibrillator in place. She was admitted to [facility] and found to have severe two-vessel coronary disease and moderate to severe left ventricular dysfunction and is now scheduled to undergo coronary artery bypass grafting by Dr. Cornelius Moras. Intraoperative transesophageal echocardiography was requested to evaluate the left and right ventricular function and to assess for valvular pathology    The patient was brought to the operating room. Anesthesia was induced without difficulty. The trachea was intubated without difficulty. Following orogastric suctioning, the transesophageal echo probe was inserted without difficulty.   Impression:   PRE-BYPASS FINDINGS: 1. Aortic valve - aortic valve is trileaflet. The leaflets opened normally there was no aortic insufficiency. There was no thickening of the leaflets noted.   2. Mitral valve - the mitral valve opened normally without prolapse or fluttering and there was trace mitral insufficiency.  3. Left ventricle - left ventricle was dilated and measured 5.63 cm at end diastole at the mid-papillary level in the short axis view. There was global left ventricular hypokinesis but no obvious akinetic or dyskinetic segments and ejection fraction was estimated at 30-35%. No thrombus noted in the left ventricular apex. The left ventricular wall thickness measured 1.1-1.15 centimeters at end diastole at the mid-papillary level.    4. Right ventricle - right ventricular was of normal size there was normal contractility of the right ventricular free wall.  5. Tricuspid valve: there was a defibrillator lead noted to be through the tricuspid valve but the valve appeared structurally intact and there was trace tricuspid insufficiency.  6.  Interatrial septum -the intra-atrial septum is intact without evidence of patent foramen ovale or atrial septal defect by color Doppler or bubble study.  7. Left atrium - there was no thrombus noted in the left atrium.  8. Left atrial appendage the left atrial appendage was well visualized and there was no thrombus noted.  9. Ascending aorta -descending aorta is non-aneurysmal and there is no significant atheromatous disease noted.   10. Descending aorta - there was moderate to severe atheromatous disease noted in the descending aorta.  11. Pericardium - there was a small pericardial effusion noted  POST-BYPASS FINDINGS:  Aortic valve - the aortic valve appeared to change from the pre-bypass study and was normal  Mitral valve - unchanged from the pre-bypass study with trace mitral insufficiency   Left ventricle - EF 30-35% there was global left ventricular hypokinesis. Following separation from coronary artery bypass there was a pocket of air noted in the left ventricular apex which resolved with de-airing procedures.   Right ventricle - upon separation from  cardiopulmonary bypass as right ventricular dysfunction noted initially but this resolved over the next 10-15 minutes.   Tricuspid valve - trace tricuspid insufficiency.   IPericardium - no residual pericardial effusion noted.  No other significant changes from pre-bypass findings.  Melonie Florida, MD 03/07/2011 4:25 PM

## 2011-03-07 NOTE — Addendum Note (Signed)
Addendum  created 03/07/11 1644 by Melonie Florida, MD   Modules edited:Notes Section

## 2011-03-07 NOTE — Procedures (Signed)
Extubation Procedure Note  Patient Details:   Name: Lori York DOB: [birth year] MRN: [id number]   Airway Documentation:  Patient extubated to 4 lpm nasal cannula.  VC 700, NIF -30, pt able to hold head off bed for 10 seconds.  Able to vocalize post procedure.  Evaluation  O2 sats: stable throughout Complications: No apparent complications Patient did tolerate procedure well. Bilateral Breath Sounds: Coarse crackles Suctioning: Airway Yes  Parrie Rasco, Aloha Gell 03/07/2011, 9:32 PM

## 2011-03-07 NOTE — Progress Notes (Signed)
Pt placed back on SIMV due to ABG results

## 2011-03-08 ENCOUNTER — Other Ambulatory Visit: Payer: Self-pay

## 2011-03-08 ENCOUNTER — Encounter (HOSPITAL_COMMUNITY): Payer: Self-pay | Admitting: Thoracic Surgery (Cardiothoracic Vascular Surgery)

## 2011-03-08 ENCOUNTER — Inpatient Hospital Stay (HOSPITAL_COMMUNITY): Payer: Medicare Other

## 2011-03-08 LAB — POCT I-STAT, CHEM 8
BUN: 24 mg/dL — ABNORMAL HIGH (ref 6–23)
Calcium, Ion: 1.28 mmol/L (ref 1.12–1.32)
Chloride: 105 mEq/L (ref 96–112)
Glucose, Bld: 130 mg/dL — ABNORMAL HIGH (ref 70–99)
HCT: 27 % — ABNORMAL LOW (ref 36.0–46.0)
TCO2: 26 mmol/L (ref 0–100)

## 2011-03-08 LAB — GLUCOSE, CAPILLARY
Glucose-Capillary: 103 mg/dL — ABNORMAL HIGH (ref 70–99)
Glucose-Capillary: 121 mg/dL — ABNORMAL HIGH (ref 70–99)

## 2011-03-08 LAB — CBC
HCT: 29.1 % — ABNORMAL LOW (ref 36.0–46.0)
Hemoglobin: 8.5 g/dL — ABNORMAL LOW (ref 12.0–15.0)
MCHC: 31.3 g/dL (ref 30.0–36.0)
MCV: 89 fL (ref 78.0–100.0)
Platelets: 201 10*3/uL (ref 150–400)
Platelets: 203 10*3/uL (ref 150–400)
RBC: 3.01 MIL/uL — ABNORMAL LOW (ref 3.87–5.11)
RDW: 15 % (ref 11.5–15.5)
WBC: 14 10*3/uL — ABNORMAL HIGH (ref 4.0–10.5)
WBC: 15 10*3/uL — ABNORMAL HIGH (ref 4.0–10.5)

## 2011-03-08 LAB — CREATININE, SERUM
Creatinine, Ser: 2.03 mg/dL — ABNORMAL HIGH (ref 0.50–1.10)
GFR calc Af Amer: 29 mL/min — ABNORMAL LOW (ref >90)

## 2011-03-08 LAB — BASIC METABOLIC PANEL
Chloride: 106 mEq/L (ref 96–112)
GFR calc Af Amer: 41 mL/min — ABNORMAL LOW (ref >90)
GFR calc non Af Amer: 35 mL/min — ABNORMAL LOW (ref >90)
Potassium: 4.4 mEq/L (ref 3.5–5.1)
Sodium: 140 mEq/L (ref 135–145)

## 2011-03-08 LAB — MAGNESIUM: Magnesium: 2.7 mg/dL — ABNORMAL HIGH (ref 1.5–2.5)

## 2011-03-08 MED ORDER — INSULIN GLARGINE 100 UNIT/ML ~~LOC~~ SOLN
20.0000 [IU] | SUBCUTANEOUS | Status: DC
  Administered 2011-03-08 – 2011-03-09 (×2): 20 [IU] via SUBCUTANEOUS
  Filled 2011-03-08: qty 3

## 2011-03-08 MED ORDER — DOPAMINE-DEXTROSE 3.2-5 MG/ML-% IV SOLN
3.0000 ug/kg/min | INTRAVENOUS | Status: DC
  Administered 2011-03-08: 3 ug/kg/min via INTRAVENOUS

## 2011-03-08 MED ORDER — MIDAZOLAM HCL 2 MG/2ML IJ SOLN
2.0000 mg | Freq: Once | INTRAMUSCULAR | Status: AC
  Administered 2011-03-08: 2 mg via INTRAVENOUS
  Filled 2011-03-08: qty 2

## 2011-03-08 MED ORDER — DOPAMINE-DEXTROSE 3.2-5 MG/ML-% IV SOLN: 3.0000 ug/kg/min | INTRAVENOUS | Status: DC

## 2011-03-08 MED ORDER — INSULIN ASPART 100 UNIT/ML ~~LOC~~ SOLN
0.0000 [IU] | SUBCUTANEOUS | Status: DC
  Administered 2011-03-08 – 2011-03-09 (×3): 2 [IU] via SUBCUTANEOUS

## 2011-03-08 MED ORDER — DOPAMINE-DEXTROSE 3.2-5 MG/ML-% IV SOLN
INTRAVENOUS | Status: AC
  Administered 2011-03-08: 3 ug/kg/min via INTRAVENOUS
  Filled 2011-03-08: qty 250

## 2011-03-08 MED ORDER — FUROSEMIDE 10 MG/ML IJ SOLN
20.0000 mg | Freq: Four times a day (QID) | INTRAMUSCULAR | Status: AC
  Administered 2011-03-08 (×3): 20 mg via INTRAVENOUS
  Filled 2011-03-08 (×3): qty 2

## 2011-03-08 NOTE — Addendum Note (Signed)
Addendum  created 03/08/11 1832 by Melonie Florida, MD   Modules edited:Notes Section

## 2011-03-08 NOTE — Progress Notes (Signed)
Alert, Oriented, neuro intact, appears comfortable. Walking with assistance.   VS: T- 37.6 BP-91/54 RR- 24 HR- 82 SR O2 sat 97 on 3l.  Cr:2.05 up from 1.02 pre-op, K: 4.5 H/H: 8.5/26.8  Extubated 8.5 hours post-op.  Renal function a concern. Dopamine restarted by Dr. Cornelius Moras.  Otherwise pt looks good.  Kipp Brood, MD

## 2011-03-08 NOTE — Progress Notes (Signed)
UR Completed.  Lori York [phone] 01/16/2012  

## 2011-03-08 NOTE — Progress Notes (Signed)
Patient ID: Lori York, female   DOB: 12-13-1945, 66 y.o.   MRN: [id number]  Filed Vitals:   03/08/11 1500 03/08/11 1600 03/08/11 1700 03/08/11 1800  BP: 101/63 108/68 93/60 91/54   Pulse: 72 71 73 74  Temp:  97.5 F (36.4 C)    TempSrc:  Oral    Resp: 15 14 19 21   Weight:      SpO2: 96% 99% 94% 98%   Urine output 20-30/hr.  BMET    Component Value Date/Time   NA 141 03/08/2011 1607   K 4.5 03/08/2011 1607   CL 105 03/08/2011 1607   CO2 23 03/08/2011 0508   GLUCOSE 130* 03/08/2011 1607   BUN 24* 03/08/2011 1607   CREATININE 2.03* 03/08/2011 1609   CALCIUM 8.7 03/08/2011 0508   GFRNONAA 25* 03/08/2011 1609   GFRAA 29* 03/08/2011 1609    CBC    Component Value Date/Time   WBC 15.0* 03/08/2011 1609   RBC 3.01* 03/08/2011 1609   HGB 8.5* 03/08/2011 1609   HCT 26.8* 03/08/2011 1609   PLT 203 03/08/2011 1609   MCV 89.0 03/08/2011 1609   MCH 28.2 03/08/2011 1609   MCHC 31.7 03/08/2011 1609   RDW 15.2 03/08/2011 1609   LYMPHSABS 1.3 12/31/2010 1301   MONOABS 0.6 12/31/2010 1301   EOSABS 0.0 12/31/2010 1301   BASOSABS 0.0 12/31/2010 1301    A/P:  Creatinine is up a little and BP is marginal. Low dose dopamine ordered. Otherwise stable.

## 2011-03-08 NOTE — Progress Notes (Signed)
   CARDIOTHORACIC SURGERY PROGRESS NOTE   R1 Day Post-Op Procedure(s) (LRB): CORONARY ARTERY BYPASS GRAFTING (CABG) (N/A)  Subjective: Looks very good.  Mild soreness in chest. No abdominal pain.  Breathing comfortably.  Objective: Vital signs: BP Readings from Last 1 Encounters:  03/08/11 103/62   Pulse Readings from Last 1 Encounters:  03/08/11 83   Resp Readings from Last 1 Encounters:  03/08/11 26   Temp Readings from Last 1 Encounters:  03/08/11 99.9 F (37.7 C)     Hemodynamics: PAP: (33-55)/(12-33) 35/14 mmHg CO:  [3.1 L/min-4.3 L/min] 4.3 L/min CI:  [1.6 L/min/m2-2.3 L/min/m2] 2.3 L/min/m2  Physical Exam:  Rhythm:   sinus  Breath sounds: clear  Heart sounds:  RRR  Incisions:  Dressings dry  Abdomen:  soft  Extremities:  warm   Intake/Output from previous day: 01/24 0701 - 01/25 0700 In: 6729.7 [I.V.:4671.7; Blood:398; NG/GT:60; IV Piggyback:1600] Out: 3085 [Urine:1815; Emesis/NG output:50; Blood:700; Chest Tube:520] Intake/Output this shift:    Lab Results:  [facility] 03/08/11 0508 03/07/11 1838  WBC 14.0* 10.9*  HGB 9.1* 9.3*  HCT 29.1* 28.8*  PLT 201 213   BMET:  Basename 03/08/11 0508 03/07/11 1838 03/07/11 1837 03/05/11 1210  NA 140 -- 140 --  K 4.4 -- 4.8 --  CL 106 -- 107 --  CO2 23 -- -- 26  GLUCOSE 121* -- 127* --  BUN 19 -- 14 --  CREATININE 1.52* 1.16* -- --  CALCIUM 8.7 -- -- 10.0    CBG (last 3)   Basename 03/08/11 0725 03/08/11 0401 03/07/11 2339  GLUCAP 136* 103* 129*   ABG    Component Value Date/Time   PHART 7.303* 03/07/2011 2110   HCO3 24.3* 03/07/2011 2110   TCO2 26 03/07/2011 2110   ACIDBASEDEF 2.0 03/07/2011 2110   O2SAT 98.0 03/07/2011 2110   CXR: Looks good.  Assessment/Plan: S/P Procedure(s) (LRB): CORONARY ARTERY BYPASS GRAFTING (CABG) (N/A)  Doing well POD1 Expected post op acute blood loss anemia, mild Expected post op volume excess, mild   Mobilize  D/C tubes and lines  Diuresis  Restart  antihypertensive agents selectively depending upon BP  Lori York H 03/08/2011 8:17 AM

## 2011-03-09 ENCOUNTER — Inpatient Hospital Stay (HOSPITAL_COMMUNITY): Payer: Medicare Other

## 2011-03-09 LAB — BASIC METABOLIC PANEL
CO2: 27 mEq/L (ref 19–32)
Calcium: 9.4 mg/dL (ref 8.4–10.5)
Creatinine, Ser: 1.85 mg/dL — ABNORMAL HIGH (ref 0.50–1.10)
GFR calc non Af Amer: 28 mL/min — ABNORMAL LOW (ref >90)
Glucose, Bld: 93 mg/dL (ref 70–99)

## 2011-03-09 LAB — GLUCOSE, CAPILLARY
Glucose-Capillary: 101 mg/dL — ABNORMAL HIGH (ref 70–99)
Glucose-Capillary: 103 mg/dL — ABNORMAL HIGH (ref 70–99)
Glucose-Capillary: 104 mg/dL — ABNORMAL HIGH (ref 70–99)
Glucose-Capillary: 85 mg/dL (ref 70–99)
Glucose-Capillary: 91 mg/dL (ref 70–99)

## 2011-03-09 LAB — TYPE AND SCREEN
ABO/RH(D): O POS
Unit division: 0

## 2011-03-09 LAB — CBC
HCT: 27.5 % — ABNORMAL LOW (ref 36.0–46.0)
MCHC: 31.3 g/dL (ref 30.0–36.0)

## 2011-03-09 MED ORDER — CARVEDILOL 12.5 MG PO TABS
12.5000 mg | ORAL_TABLET | Freq: Two times a day (BID) | ORAL | Status: DC
  Administered 2011-03-09 – 2011-03-11 (×5): 12.5 mg via ORAL
  Filled 2011-03-09 (×7): qty 1

## 2011-03-09 NOTE — Progress Notes (Signed)
2 Days Post-Op Procedure(s) (LRB): CORONARY ARTERY BYPASS GRAFTING (CABG) (N/A) Subjective: No complaints  Objective: Vital signs in last 24 hours: Temp:  [97.5 F (36.4 C)-99 F (37.2 C)] 99 F (37.2 C) (01/26 0736) Pulse Rate:  [69-95] 82  (01/26 0700) Cardiac Rhythm:  [-] Normal sinus rhythm (01/25 2000) Resp:  [14-28] 21  (01/26 0700) BP: (91-140)/(49-76) 124/74 mmHg (01/26 0700) SpO2:  [91 %-99 %] 95 % (01/26 0813) Weight:  [87.1 kg (192 lb 0.3 oz)] 87.1 kg (192 lb 0.3 oz) (01/26 0500)  Hemodynamic parameters for last 24 hours:    Intake/Output from previous day: 01/25 0701 - 01/26 0700 In: 1140.9 [P.O.:580; I.V.:556.9; IV Piggyback:4] Out: 820 [Urine:710; Chest Tube:110] Intake/Output this shift:    General appearance: alert and cooperative Heart: regular rate and rhythm, S1, S2 normal, no murmur, click, rub or gallop Lungs: clear to auscultation bilaterally Extremities: edema minimal Wound: dressing dry  Lab Results:  Basename 03/09/11 0414 03/08/11 1609  WBC 12.6* 15.0*  HGB 8.6* 8.5*  HCT 27.5* 26.8*  PLT 211 203   BMET:  Basename 03/09/11 0414 03/08/11 1609 03/08/11 1607 03/08/11 0508  NA 138 -- 141 --  K 3.9 -- 4.5 --  CL 104 -- 105 --  CO2 27 -- -- 23  GLUCOSE 93 -- 130* --  BUN 28* -- 24* --  CREATININE 1.85* 2.03* -- --  CALCIUM 9.4 -- -- 8.7    PT/INR:  Basename 03/07/11 1300  LABPROT 19.8*  INR 1.65*   ABG    Component Value Date/Time   PHART 7.303* 03/07/2011 2110   HCO3 24.3* 03/07/2011 2110   TCO2 26 03/08/2011 1607   ACIDBASEDEF 2.0 03/07/2011 2110   O2SAT 98.0 03/07/2011 2110   CBG (last 3)   Basename 03/09/11 0734 03/09/11 0401 03/09/11 0001  GLUCAP 91 103* 128*     CXR:  Left base atelectasis  Assessment/Plan: S/P Procedure(s) (LRB): CORONARY ARTERY BYPASS GRAFTING (CABG) (N/A) Acute postop renal failure:  Improved.  Continue low dose dopamine today. Continue mobilization, IS.   LOS: 2 days    Evelene Croon  K 03/09/2011

## 2011-03-10 LAB — BASIC METABOLIC PANEL
Calcium: 9 mg/dL (ref 8.4–10.5)
GFR calc non Af Amer: 42 mL/min — ABNORMAL LOW (ref >90)
Potassium: 3.6 mEq/L (ref 3.5–5.1)
Sodium: 138 mEq/L (ref 135–145)

## 2011-03-10 LAB — GLUCOSE, CAPILLARY
Glucose-Capillary: 80 mg/dL (ref 70–99)
Glucose-Capillary: 96 mg/dL (ref 70–99)
Glucose-Capillary: 135 mg/dL — ABNORMAL HIGH (ref 70–99)
Glucose-Capillary: 114 mg/dL — ABNORMAL HIGH (ref 70–99)

## 2011-03-10 LAB — CBC
Hemoglobin: 7.7 g/dL — ABNORMAL LOW (ref 12.0–15.0)
Platelets: 199 10*3/uL (ref 150–400)
RBC: 2.76 MIL/uL — ABNORMAL LOW (ref 3.87–5.11)
WBC: 10.9 10*3/uL — ABNORMAL HIGH (ref 4.0–10.5)

## 2011-03-10 LAB — PREPARE RBC (CROSSMATCH)

## 2011-03-10 MED ORDER — DOPAMINE-DEXTROSE 3.2-5 MG/ML-% IV SOLN: 3.0000 ug/kg/min | INTRAVENOUS | Status: DC

## 2011-03-10 MED ORDER — INSULIN GLARGINE 100 UNIT/ML ~~LOC~~ SOLN
10.0000 [IU] | SUBCUTANEOUS | Status: DC
Start: 2011-03-11 — End: 2011-03-12
  Administered 2011-03-11: 10 [IU] via SUBCUTANEOUS

## 2011-03-10 MED ORDER — FUROSEMIDE 10 MG/ML IJ SOLN
80.0000 mg | Freq: Once | INTRAMUSCULAR | Status: AC
  Administered 2011-03-10: 80 mg via INTRAVENOUS
  Filled 2011-03-10: qty 8

## 2011-03-10 MED ORDER — POTASSIUM CHLORIDE 10 MEQ/50ML IV SOLN
10.0000 meq | INTRAVENOUS | Status: AC
  Administered 2011-03-10 (×3): 10 meq via INTRAVENOUS
  Filled 2011-03-10: qty 150

## 2011-03-10 MED ORDER — INSULIN ASPART 100 UNIT/ML ~~LOC~~ SOLN
0.0000 [IU] | Freq: Three times a day (TID) | SUBCUTANEOUS | Status: DC
  Administered 2011-03-10 – 2011-03-11 (×2): 2 [IU] via SUBCUTANEOUS
  Administered 2011-03-11: 4 [IU] via SUBCUTANEOUS

## 2011-03-10 MED ORDER — INSULIN GLARGINE 100 UNIT/ML ~~LOC~~ SOLN
10.0000 [IU] | Freq: Once | SUBCUTANEOUS | Status: AC
  Administered 2011-03-10: 10 [IU] via SUBCUTANEOUS

## 2011-03-10 NOTE — Progress Notes (Signed)
3 Days Post-Op Procedure(s) (LRB): CORONARY ARTERY BYPASS GRAFTING (CABG) (N/A) Subjective: No complaints  Objective: Vital signs in last 24 hours: Temp:  [97.8 F (36.6 C)-99.3 F (37.4 C)] 98.4 F (36.9 C) (01/27 0800) Pulse Rate:  [67-82] 71  (01/27 0900) Cardiac Rhythm:  [-] Normal sinus rhythm (01/27 0800) Resp:  [12-25] 18  (01/27 0900) BP: (100-137)/(55-86) 122/72 mmHg (01/27 0900) SpO2:  [93 %-100 %] 98 % (01/27 0900) Weight:  [87.7 kg (193 lb 5.5 oz)] 87.7 kg (193 lb 5.5 oz) (01/27 0500)  Dopamine at 3 mcg/kg/min  Hemodynamic parameters for last 24 hours:    Intake/Output from previous day: 01/26 0701 - 01/27 0700 In: 1647.3 [P.O.:970; I.V.:577.3; IV Piggyback:100] Out: 1015 [Urine:1015] Intake/Output this shift: Total I/O In: 344.9 [P.O.:300; I.V.:44.9] Out: -   General appearance: alert and cooperative Heart: regular rate and rhythm, S1, S2 normal, no murmur, click, rub or gallop Lungs: clear to auscultation bilaterally Extremities: edema mild peripheral Wound: dressing dry  Lab Results:  Basename 03/10/11 0400 03/09/11 0414  WBC 10.9* 12.6*  HGB 7.7* 8.6*  HCT 24.1* 27.5*  PLT 199 211   BMET:  Basename 03/10/11 0400 03/09/11 0414  NA 138 138  K 3.6 3.9  CL 103 104  CO2 27 27  GLUCOSE 71 93  BUN 31* 28*  CREATININE 1.33* 1.85*  CALCIUM 9.0 9.4    PT/INR:  Basename 03/07/11 1300  LABPROT 19.8*  INR 1.65*   ABG    Component Value Date/Time   PHART 7.303* 03/07/2011 2110   HCO3 24.3* 03/07/2011 2110   TCO2 26 03/08/2011 1607   ACIDBASEDEF 2.0 03/07/2011 2110   O2SAT 98.0 03/07/2011 2110   CBG (last 3)   Basename 03/10/11 0353 03/09/11 2343 03/09/11 1532  GLUCAP 80 101* 85    Assessment/Plan: S/P Procedure(s) (LRB): CORONARY ARTERY BYPASS GRAFTING (CABG) (N/A) Mobilize Diuresis Diabetes control acute blood loss anemia:  With Hgb of 7.7 will transfuse 1 unit prbc's.  Discussed with patient and she agrees. Acute on chronic renal  failure:  Creatinine back down towards baseline.  Wean off dopamine.   LOS: 3 days    Osamah Schmader K 03/10/2011

## 2011-03-10 NOTE — Progress Notes (Signed)
Patient ID: Lori York, female   DOB: 1945/06/25, 66 y.o.   MRN: [id number]   Filed Vitals:   03/10/11 1430 03/10/11 1500 03/10/11 1534 03/10/11 1600  BP: 116/59 127/80  138/75  Pulse: 71 70  69  Temp: 98 F (36.7 C)  98.2 F (36.8 C)   TempSrc: Oral  Oral   Resp: 17 18  18   Weight:      SpO2:  100%  100%   Off dopamine  Transfused 1 unit prbc's.  Ambulated Diuresing.  Stable day

## 2011-03-11 LAB — GLUCOSE, CAPILLARY
Glucose-Capillary: 82 mg/dL (ref 70–99)
Glucose-Capillary: 95 mg/dL (ref 70–99)
Glucose-Capillary: 193 mg/dL — ABNORMAL HIGH (ref 70–99)

## 2011-03-11 LAB — TYPE AND SCREEN: ABO/RH(D): O POS

## 2011-03-11 LAB — CBC
HCT: 26.1 % — ABNORMAL LOW (ref 36.0–46.0)
MCHC: 32.2 g/dL (ref 30.0–36.0)
Platelets: 210 10*3/uL (ref 150–400)
RDW: 14.8 % (ref 11.5–15.5)

## 2011-03-11 LAB — BASIC METABOLIC PANEL
BUN: 32 mg/dL — ABNORMAL HIGH (ref 6–23)
GFR calc Af Amer: 46 mL/min — ABNORMAL LOW (ref >90)
GFR calc non Af Amer: 40 mL/min — ABNORMAL LOW (ref >90)
Potassium: 3.7 mEq/L (ref 3.5–5.1)

## 2011-03-11 MED ORDER — POTASSIUM CHLORIDE 10 MEQ/50ML IV SOLN
10.0000 meq | INTRAVENOUS | Status: AC | PRN
  Administered 2011-03-11 (×3): 10 meq via INTRAVENOUS
  Filled 2011-03-11: qty 150

## 2011-03-11 MED FILL — Lidocaine HCl IV Inj 20 MG/ML: INTRAVENOUS | Qty: 5 | Status: AC

## 2011-03-11 MED FILL — Heparin Sodium (Porcine) Inj 1000 Unit/ML: INTRAMUSCULAR | Qty: 30 | Status: AC

## 2011-03-11 MED FILL — Sodium Bicarbonate IV Soln 8.4%: INTRAVENOUS | Qty: 1 | Status: AC

## 2011-03-11 MED FILL — Sodium Chloride IV Soln 0.9%: INTRAVENOUS | Qty: 1000 | Status: AC

## 2011-03-11 MED FILL — Mannitol IV Soln 20%: INTRAVENOUS | Qty: 500 | Status: AC

## 2011-03-11 MED FILL — Electrolyte-R (PH 7.4) Solution: INTRAVENOUS | Qty: 3000 | Status: AC

## 2011-03-11 MED FILL — Heparin Sodium (Porcine) Inj 1000 Unit/ML: INTRAMUSCULAR | Qty: 10 | Status: AC

## 2011-03-11 MED FILL — Sodium Chloride Irrigation Soln 0.9%: Qty: 3000 | Status: AC

## 2011-03-11 NOTE — Progress Notes (Signed)
   CARE MANAGEMENT NOTE 03/11/2011  Patient:  Lori York, Lori York   Account Number:  [id number]  Date Initiated:  03/08/2011  Documentation initiated by:  [facility]  Subjective/Objective Assessment:   post op CABG x3 on 03-07-11.  Has children     Action/Plan:   PTA, PT INDEPENDENT, LIVES WITH SON AND NIECE.   Anticipated DC Date:  03/12/2011   Anticipated DC Plan:  HOME W HOME HEALTH SERVICES      DC Planning Services  CM consult      Choice offered to / List presented to:             Status of service:  In process, will continue to follow Medicare Important Message given?   (If response is "NO", the following Medicare IM given date fields will be blank) Date Medicare IM given:   Date Additional Medicare IM given:    Discharge Disposition:    Per UR Regulation:  Reviewed for med. necessity/level of care/duration of stay  Comments:  03/11/11 Lori Halleck,RN,BSN 1345 MET WITH PT TO DISCUSS DC PLANS.  PT STATES SON AND NIECE WILL PROVIDE 24HR CARE AT DC.  SHE STATES SHE THINKS SHE CAN BORROW A RW FROM HER NEPHEW.  WILL FOLLOW FOR ADDITIONAL NEEDS. Phone #[phone]   03-08-11 11:40am Lori York, RNBSN - [phone] UR completed.

## 2011-03-11 NOTE — Progress Notes (Signed)
   CARDIOTHORACIC SURGERY PROGRESS NOTE   R4 Days Post-Op Procedure(s) (LRB): CORONARY ARTERY BYPASS GRAFTING (CABG) (N/A)  Subjective: Feels "great" with no complaints at all.  Objective: Vital signs: BP Readings from Last 1 Encounters:  03/11/11 141/76   Pulse Readings from Last 1 Encounters:  03/11/11 67   Resp Readings from Last 1 Encounters:  03/11/11 14   Temp Readings from Last 1 Encounters:  03/11/11 98 F (36.7 C) Oral    Hemodynamics:    Physical Exam:  Rhythm:   sinus  Breath sounds: clear  Heart sounds:  RRR  Incisions:  Dressings dry  Abdomen:  Soft, non tender  Extremities:  Warm, well perfused   Intake/Output from previous day: 01/27 0701 - 01/28 0700 In: 1599.5 [P.O.:840; I.V.:309.5; Blood:350; IV Piggyback:100] Out: 1415 [Urine:1415] Intake/Output this shift: Total I/O In: 50 [IV Piggyback:50] Out: -   Lab Results:  Basename 03/11/11 0419 03/10/11 0400  WBC 8.1 10.9*  HGB 8.4* 7.7*  HCT 26.1* 24.1*  PLT 210 199   BMET:  Basename 03/11/11 0419 03/10/11 0400  NA 137 138  K 3.7 3.6  CL 104 103  CO2 27 27  GLUCOSE 94 71  BUN 32* 31*  CREATININE 1.37* 1.33*  CALCIUM 8.7 9.0    CBG (last 3)   Basename 03/11/11 0740 03/11/11 0354 03/10/11 2340  GLUCAP 82 98 102*   ABG    Component Value Date/Time   PHART 7.303* 03/07/2011 2110   HCO3 24.3* 03/07/2011 2110   TCO2 26 03/08/2011 1607   ACIDBASEDEF 2.0 03/07/2011 2110   O2SAT 98.0 03/07/2011 2110   CXR: Mild atelectasis L>R  Assessment/Plan: S/P Procedure(s) (LRB): CORONARY ARTERY BYPASS GRAFTING (CABG) (N/A)  Doing well POD4 Anemia improved Renal function at baseline Transfer 2000 Possible d/c home soon  Lanita Stammen H 03/11/2011 8:20 AM

## 2011-03-12 ENCOUNTER — Inpatient Hospital Stay (HOSPITAL_COMMUNITY): Payer: Medicare Other

## 2011-03-12 LAB — GLUCOSE, CAPILLARY
Glucose-Capillary: 92 mg/dL (ref 70–99)
Glucose-Capillary: 109 mg/dL — ABNORMAL HIGH (ref 70–99)

## 2011-03-12 MED ORDER — ZOLPIDEM TARTRATE 5 MG PO TABS: 10.0000 mg | ORAL_TABLET | Freq: Every evening | ORAL | Status: DC | PRN

## 2011-03-12 MED ORDER — METFORMIN HCL 500 MG PO TABS
500.0000 mg | ORAL_TABLET | Freq: Every day | ORAL | Status: DC
  Administered 2011-03-12 – 2011-03-14 (×3): 500 mg via ORAL
  Filled 2011-03-12 (×3): qty 1

## 2011-03-12 MED ORDER — SODIUM CHLORIDE 0.9 % IJ SOLN: 3.0000 mL | INTRAMUSCULAR | Status: DC | PRN

## 2011-03-12 MED ORDER — LOSARTAN POTASSIUM 50 MG PO TABS
100.0000 mg | ORAL_TABLET | Freq: Every day | ORAL | Status: DC
  Administered 2011-03-12 – 2011-03-14 (×3): 100 mg via ORAL
  Filled 2011-03-12 (×3): qty 2

## 2011-03-12 MED ORDER — CARVEDILOL 25 MG PO TABS
25.0000 mg | ORAL_TABLET | Freq: Two times a day (BID) | ORAL | Status: DC
  Administered 2011-03-12 – 2011-03-14 (×5): 25 mg via ORAL
  Filled 2011-03-12 (×6): qty 1

## 2011-03-12 MED ORDER — SODIUM CHLORIDE 0.9 % IV SOLN: 250.0000 mL | INTRAVENOUS | Status: DC | PRN

## 2011-03-12 MED ORDER — MOVING RIGHT ALONG BOOK
Freq: Once | Status: AC
  Administered 2011-03-12: 09:00:00
  Filled 2011-03-12: qty 1

## 2011-03-12 MED ORDER — OXYCODONE HCL 5 MG PO TABS: 5.0000 mg | ORAL_TABLET | Freq: Four times a day (QID) | ORAL | Status: DC | PRN

## 2011-03-12 MED ORDER — INSULIN ASPART 100 UNIT/ML ~~LOC~~ SOLN
0.0000 [IU] | Freq: Three times a day (TID) | SUBCUTANEOUS | Status: DC
  Administered 2011-03-12 – 2011-03-13 (×2): 2 [IU] via SUBCUTANEOUS

## 2011-03-12 MED ORDER — ASPIRIN 325 MG PO TBEC: 325.0000 mg | DELAYED_RELEASE_TABLET | Freq: Every day | ORAL | Status: DC

## 2011-03-12 MED ORDER — FUROSEMIDE 40 MG PO TABS
40.0000 mg | ORAL_TABLET | Freq: Every day | ORAL | Status: AC
  Administered 2011-03-12: 40 mg via ORAL
  Filled 2011-03-12: qty 1

## 2011-03-12 MED ORDER — SODIUM CHLORIDE 0.9 % IJ SOLN
3.0000 mL | Freq: Two times a day (BID) | INTRAMUSCULAR | Status: DC
  Administered 2011-03-12 – 2011-03-13 (×4): 3 mL via INTRAVENOUS

## 2011-03-12 NOTE — Progress Notes (Signed)
Confirmed with Lurline Del, [employer] medical device Rep, that the  ICD was enabled on  1/24 at 1418, records are in shadow chart .

## 2011-03-12 NOTE — Progress Notes (Addendum)
[address]            [place] [address]          [phone]     5 Days Post-Op  Procedure(s) (LRB): CORONARY ARTERY BYPASS GRAFTING (CABG) (N/A) Subjective: Feels poorly this am, but fairly non-specific  Objective  Telemetry NSR  Temp:  [97 F (36.1 C)-97.2 F (36.2 C)] 97.2 F (36.2 C) (01/29 0521) Pulse Rate:  [67-77] 75  (01/29 0521) Resp:  [17-20] 19  (01/29 0521) BP: (138-149)/(75-93) 149/93 mmHg (01/29 0521) SpO2:  [91 %-100 %] 95 % (01/29 0521) Weight:  [195 lb 12.3 oz (88.8 kg)] 195 lb 12.3 oz (88.8 kg) (01/29 0521)   Intake/Output Summary (Last 24 hours) at 03/12/11 0821 Last data filed at 03/11/11 1700  Gross per 24 hour  Intake    530 ml  Output     50 ml  Net    480 ml       General appearance: alert and no distress Heart: regular rate and rhythm and S1, S2 normal Lungs: clear to auscultation bilaterally Abdomen: soft, non-tender; bowel sounds normal; no masses,  no organomegaly Extremities: minor edema Wound: incisions healing well  Lab Results:  Basename 03/11/11 0419 03/10/11 0400  NA 137 138  K 3.7 3.6  CL 104 103  CO2 27 27  GLUCOSE 94 71  BUN 32* 31*  CREATININE 1.37* 1.33*  CALCIUM 8.7 9.0  MG -- --  PHOS -- --   No results found for this basename: AST:2,ALT:2,ALKPHOS:2,BILITOT:2,PROT:2,ALBUMIN:2 in the last 72 hours No results found for this basename: LIPASE:2,AMYLASE:2 in the last 72 hours  Basename 03/11/11 0419 03/10/11 0400  WBC 8.1 10.9*  NEUTROABS -- --  HGB 8.4* 7.7*  HCT 26.1* 24.1*  MCV 88.8 87.3  PLT 210 199   No results found for this basename: CKTOTAL:4,CKMB:4,TROPONINI:4 in the last 72 hours No components found with this basename: POCBNP:3 No results found for this basename: DDIMER in the last 72 hours No results found for this basename: HGBA1C in the last 72 hours No results found for this basename: CHOL,HDL,LDLCALC,TRIG,CHOLHDL in the last 72 hours No results found for  this basename: TSH,T4TOTAL,FREET3,T3FREE,THYROIDAB in the last 72 hours No results found for this basename: VITAMINB12,FOLATE,FERRITIN,TIBC,IRON,RETICCTPCT in the last 72 hours  Medications: Scheduled    . aspirin EC  325 mg Oral Daily  . bisacodyl  10 mg Oral Daily   Or  . bisacodyl  10 mg Rectal Daily  . budesonide-formoterol  2 puff Inhalation BID  . carvedilol  12.5 mg Oral BID  . docusate sodium  200 mg Oral Daily  . insulin aspart  0-24 Units Subcutaneous TID AC & HS  . metFORMIN  500 mg Oral Daily  . moving right along book   Does not apply Once  . pantoprazole  40 mg Oral Q1200  . PARoxetine  20 mg Oral Daily  . simvastatin  20 mg Oral q1800  . sodium chloride  3 mL Intravenous Q12H  . DISCONTD: acetaminophen  1,000 mg Oral Q6H  . DISCONTD: aspirin  324 mg Per Tube Daily  . DISCONTD: insulin aspart  0-24 Units Subcutaneous TID WC & HS  . DISCONTD: insulin glargine  10 Units Subcutaneous Q0700  . DISCONTD: sodium chloride  3 mL Intravenous Q12H     Radiology/Studies:  No results found.  INR: Will add last result  for INR, ABG once components are confirmed Will add last 4 CBG results once components are confirmed  Assessment/Plan: S/P Procedure(s) (LRB): CORONARY ARTERY BYPASS GRAFTING (CABG) (N/A)  1. Diurese some today, she is 8 kg above preop wt 2. May be able to restart cozaar soon for HTN( or partial dose) , recheck bmet in am. 3 d/c epw's today 4. Cont rehab 5. cbg 95-193 range, cont metformin 5. Poss home in am  LOS: 5 days    GOLD,WAYNE E 1/29/20138:21 AM   I have seen and examined the patient and agree with the assessment and plan as outlined.  Restart Cozaar and increase Coreg to preop dosage.  Need to document that defibrillator has been reactivated post op.  No documentation in chart. Was it done?  Labs not done despite order due to nursing error with release of transfer orders.  Tikisha Molinaro H 03/12/2011 8:52 AM

## 2011-03-12 NOTE — Progress Notes (Signed)
CARDIAC REHAB PHASE I   PRE:  Rate/Rhythm: 83SR  BP:  Supine:   Sitting: 150/90  Standing:    SaO2: 93%RA  MODE:  Ambulation: 500 ft   POST:  Rate/Rhythem: 81  BP:  Supine:   Sitting: 160/90  Standing:    SaO2: 94%RA 1320-1400 Pt walked 500 ft on RA with rolling walker and minimal asst. Tolerated well. Needed motivation to walk. Did well on RA. Would recommend rolling walker for home use as pt states she does not have one. To recliner after walk. Call bell in reach.  Lori York

## 2011-03-12 NOTE — Progress Notes (Signed)
Pacing wires pulled at 11:35am. All wires intact. Patient tolerated procedure well. No s/s of discomfort. No arrhythmias. Will continue to monitor vital signs. Bedrest until 1235. Call bell within reach. Lori York

## 2011-03-13 LAB — CBC
Hemoglobin: 9 g/dL — ABNORMAL LOW (ref 12.0–15.0)
MCH: 28 pg (ref 26.0–34.0)
MCHC: 31.7 g/dL (ref 30.0–36.0)
Platelets: 308 10*3/uL (ref 150–400)
RDW: 15.3 % (ref 11.5–15.5)

## 2011-03-13 LAB — BASIC METABOLIC PANEL
Calcium: 9.5 mg/dL (ref 8.4–10.5)
GFR calc Af Amer: 61 mL/min — ABNORMAL LOW (ref >90)
GFR calc non Af Amer: 53 mL/min — ABNORMAL LOW (ref >90)
Potassium: 4.1 mEq/L (ref 3.5–5.1)
Sodium: 140 mEq/L (ref 135–145)

## 2011-03-13 LAB — GLUCOSE, CAPILLARY
Glucose-Capillary: 107 mg/dL — ABNORMAL HIGH (ref 70–99)
Glucose-Capillary: 101 mg/dL — ABNORMAL HIGH (ref 70–99)
Glucose-Capillary: 138 mg/dL — ABNORMAL HIGH (ref 70–99)

## 2011-03-13 MED ORDER — HYDRALAZINE HCL 50 MG PO TABS
50.0000 mg | ORAL_TABLET | Freq: Three times a day (TID) | ORAL | Status: DC
  Administered 2011-03-13 – 2011-03-14 (×4): 50 mg via ORAL
  Filled 2011-03-13 (×6): qty 1

## 2011-03-13 NOTE — Progress Notes (Signed)
CARDIAC REHAB PHASE I   PRE:  Rate/Rhythm: 80 SR    BP: sitting 156/92    SaO2: 97  RA  MODE:  Ambulation: 500 ft   POST:  Rate/Rhythm: 102    BP: sitting 164/100     SaO2: 94 RA  Pt with increased BP this am. Received new med approx. 1 hr prior to walk. Tolerated well with RW and assist x1. BP higher after walk. No c/o, to recliner.  Sts she has lost the RW that she got less than 5 yrs ago (so Medicare won't pay for new one). Family trying to get one for her. Sts her nephew might be able to get her one by Friday.  [phone]  Harriet Masson CES, ACSM

## 2011-03-13 NOTE — Progress Notes (Signed)
Pt says she quit smoking . Congratulated and encouraged pt to remain tobacco free. Discussed relapse prevention strategies. Referred to 1-800 quit now for f/u and support. Discussed oral fixation substitutes, second hand smoke and in home smoking policy. Reviewed and gave pt Written education/contact information.

## 2011-03-13 NOTE — Progress Notes (Addendum)
[address]            [place] [address]          [phone]     6 Days Post-Op  Procedure(s) (LRB): CORONARY ARTERY BYPASS GRAFTING (CABG) (N/A) Subjective: Feels some discomfort/SOB she relates to sternotomy  Objective  Telemetry NSR, short burst of SVT this am to 140's   Temp:  [97.8 F (36.6 C)-98 F (36.7 C)] 97.8 F (36.6 C) (01/30 0559) Pulse Rate:  [76-80] 80  (01/30 0559) Resp:  [18-20] 18  (01/30 0559) BP: (145-171)/(75-100) 171/100 mmHg (01/30 0559) SpO2:  [92 %-99 %] 92 % (01/30 0559) Weight:  [192 lb 10.9 oz (87.4 kg)] 192 lb 10.9 oz (87.4 kg) (01/30 0559)   Intake/Output Summary (Last 24 hours) at 03/13/11 0744 Last data filed at 03/12/11 1700  Gross per 24 hour  Intake    840 ml  Output    400 ml  Net    440 ml       General appearance: alert and no distress Heart: irregularly irregular rhythm and S1, S2 normal Lungs: mildly diminished in the bases Abdomen: benign exam Extremities: trace LE edema Wound: incisions healing well, evh slightly separated skin edge  Lab Results:  Basename 03/13/11 0535 03/11/11 0419  NA 140 137  K 4.1 3.7  CL 106 104  CO2 27 27  GLUCOSE 105* 94  BUN 25* 32*  CREATININE 1.09 1.37*  CALCIUM 9.5 8.7  MG -- --  PHOS -- --   No results found for this basename: AST:2,ALT:2,ALKPHOS:2,BILITOT:2,PROT:2,ALBUMIN:2 in the last 72 hours No results found for this basename: LIPASE:2,AMYLASE:2 in the last 72 hours  Basename 03/13/11 0535 03/11/11 0419  WBC 6.7 8.1  NEUTROABS -- --  HGB 9.0* 8.4*  HCT 28.4* 26.1*  MCV 88.5 88.8  PLT 308 210   No results found for this basename: CKTOTAL:4,CKMB:4,TROPONINI:4 in the last 72 hours No components found with this basename: POCBNP:3 No results found for this basename: DDIMER in the last 72 hours No results found for this basename: HGBA1C in the last 72 hours No results found for this basename: CHOL,HDL,LDLCALC,TRIG,CHOLHDL in the last 72  hours No results found for this basename: TSH,T4TOTAL,FREET3,T3FREE,THYROIDAB in the last 72 hours No results found for this basename: VITAMINB12,FOLATE,FERRITIN,TIBC,IRON,RETICCTPCT in the last 72 hours  Medications: Scheduled    . aspirin EC  325 mg Oral Daily  . bisacodyl  10 mg Oral Daily   Or  . bisacodyl  10 mg Rectal Daily  . budesonide-formoterol  2 puff Inhalation BID  . carvedilol  25 mg Oral BID  . docusate sodium  200 mg Oral Daily  . furosemide  40 mg Oral Daily  . insulin aspart  0-24 Units Subcutaneous TID AC & HS  . losartan  100 mg Oral Daily  . metFORMIN  500 mg Oral Daily  . moving right along book   Does not apply Once  . pantoprazole  40 mg Oral Q1200  . PARoxetine  20 mg Oral Daily  . simvastatin  20 mg Oral q1800  . sodium chloride  3 mL Intravenous Q12H  . DISCONTD: carvedilol  12.5 mg Oral BID     Radiology/Studies:  Dg Chest 2 View  03/12/2011  *RADIOLOGY REPORT*  Clinical Data: Postop CABG.  CHEST - 2 VIEW  Comparison: 03/09/2011  Findings: Prior CABG.  Left pacer remains  in place, unchanged., slightly improved.  Improving right basilar atelectasis.  Small left effusion, stable.  IMPRESSION: Improving aeration in the lung bases.  Original Report Authenticated By: Cyndie Chime, M.D.    INR: Will add last result for INR, ABG once components are confirmed Will add last 4 CBG results once components are confirmed  Assessment/Plan: S/P Procedure(s) (LRB): CORONARY ARTERY BYPASS GRAFTING (CABG) (N/A)  1. Overall doing well 2. HTN - losartan started yesterday, on coreg, wasalso on hydralazine preop- will restart 3. Brief episode of SVT, monitor. also [employer] rep restarted AICD on the 24th 4. Could possibly go home later today  LOS: 6 days    GOLD,WAYNE E 1/30/20137:44 AM    I have seen and examined the patient and agree with the assessment and plan as outlined.  Will hold d/c until tomorrow to make certain BP under good control and no further  arrhythmias.  Diyan Dave H 03/13/2011 9:02 AM

## 2011-03-14 MED ORDER — POTASSIUM CHLORIDE ER 10 MEQ PO TBCR: 10.0000 meq | EXTENDED_RELEASE_TABLET | Freq: Two times a day (BID) | ORAL | Status: DC

## 2011-03-14 MED ORDER — OXYCODONE HCL 5 MG PO TABS: 5.0000 mg | ORAL_TABLET | Freq: Four times a day (QID) | ORAL | Status: DC | PRN

## 2011-03-14 MED ORDER — HYDRALAZINE HCL 50 MG PO TABS: 50.0000 mg | ORAL_TABLET | Freq: Three times a day (TID) | ORAL | Status: DC

## 2011-03-14 MED ORDER — FUROSEMIDE 40 MG PO TABS: 40.0000 mg | ORAL_TABLET | Freq: Every day | ORAL | Status: DC

## 2011-03-14 NOTE — Discharge Summary (Signed)
I agree with the above discharge summary and plan for follow-up.  OWEN,CLARENCE H  

## 2011-03-14 NOTE — Discharge Summary (Signed)
[address]            [place] [address]          [phone]      Lori York September 27, 1945 66 y.o. [id number]  03/07/2011   Purcell Nails, MD  CAD  HPI:  Patient is a 66 year old African American female from [place] with no previous history of coronary artery disease but risk factors notable for history of hypertension, type 2 diabetes mellitus, hyperlipidemia, long-standing tobacco abuse, and previous history of congestive heart failure. The patient is followed by Dr. Johney Frame with history of prolonged QT syndrome and sudden death. She originally had a pacemaker placed in 1997 and this was later exchanged to a defibrillator. She states that her defibrillator fired a couple of times proximally 5 years ago and shocked her back from a potentially lethal arrhythmia. Her most recent defibrillator was replaced in 2010. Over the years she has been noncompliant at times with followup. She recently returned for followup with symptoms of cough, shortness breath and orthopnea. Her blood pressure was reportedly extremely high. A followup echocardiogram was performed demonstrating severe left ventricular dysfunction with ejection fraction 20-25%. She subsequently underwent elective cardiac catheterization last week by Dr. Excell Seltzer. She was found to have severe two-vessel coronary artery disease with severe proximal left anterior descending coronary artery stenosis and moderate to severe left ventricular dysfunction. She was also noted to have an infrarenal abdominal aortic aneurysm. The patient was referred for possible elective surgical revascularization for treatment of her coronary artery disease. Elective coronary artery bypass grafting was planned in December, but surgery was postponed when the patient developed severe epistaxis in the setting of severe, uncontrolled hypertension that required nasal packing in the ED. Medications for hypertension were adjusted and  her blood pressure brought under control. She was also seen in consultation by Dr. Darrick Penna because of her large abdominal aortic aneurysm. Patient returns for follow-up with plans to proceed for coronary artery bypass grafting tomorrow morning. She was last seen in the office 2 weeks ago. Since then she has remained clinically stable although she did have another episode of substernal chest pain at rest that woke her from her sleep last week. She took 2 sublingual nitroglycerin and the pain ultimately subsided. She otherwise has remained clinically stable. She does note that she has not had an appetite and she has not been eating as well recently. In addition, she states that she seems to get full fast and she has had loose bowel movements that usually occur early after meals. She wonders if this might all be related to her aneurysm. She has had some vague discomfort across her upper abdomen that seems to be intermittent and positional in nature. She's had some intermittent nausea and vomiting of for the most part she see eating and certainly tolerating liquids well. She has not had any shortness of breath. She has not had any fevers chills or productive cough. The remainder of her review of systems is unchanged from previously.  Past Medical History   Diagnosis  Date   .  Long Q-T syndrome      w fx of sudden cardiac arrest   .  HTN (hypertension)    .  Diabetes mellitus    .  Glaucoma    .  Noncompliance    .  CAD (coronary artery disease)  LHC 01/04/11: dLM 20%, pLAD 90%, oD1 80%, RCA 80%, EF 35-40%.   .  Ischemic cardiomyopathy    .  Chronic systolic heart failure    .  AAA (abdominal aortic aneurysm)      u/s 01/07/11: 5.3x5.5 cm infrarenal fusiform AAA.   Marland Kitchen  Renal artery stenosis      at [facility] 11/12: prox left RA 80% stenosis   .  HLD (hyperlipidemia)     Past Surgical History   Procedure  Date   .  Cardiac defibrillator placement      in 1997 with revision to a Guidant model 1860 single  chamber implantable cardioverter- defib on Feb18, 2003, most recent generator change 2/11 by Dr Johney Frame S[facility]   .  Laminectomy  May 2008   .  Right knee resconstructuion  1970   .  Laminectomy  11/20/07     LF5-S1 fusion    Family History   Problem  Relation  Age of Onset   .  Long QT syndrome  Mother    .  Long QT syndrome  Sister    .  Long QT syndrome  Brother    .  Long QT syndrome  Daughter    .  Coronary artery disease  Sister  62   Social History  History   Substance Use Topics   .  Smoking status:  Current Everyday Smoker -- 0.3 packs/day     Types:  Cigarettes   .  Smokeless tobacco:  Never Used     Comment: Smokes less thatn 1/2 pack per day and has no interest in [place] t this time.    .  Alcohol Use:  No      Current Outpatient Prescriptions  Medication  Sig  Dispense  Refill  .  albuterol (PROVENTIL HFA;VENTOLIN HFA) 108 (90 BASE) MCG/ACT inhaler  Inhale 2 puffs into the lungs every 6 (six) hours as needed.  Marland Kitchen  atorvastatin (LIPITOR) 20 MG tablet  Take 20 mg by mouth daily.  .  budesonide-formoterol (SYMBICORT) 80-4.5 MCG/ACT inhaler  Inhale 2 puffs into the lungs. prn  .  carvedilol (COREG) 12.5 MG tablet  Take 25 mg by mouth 2 (two) times daily.  Marland Kitchen  esomeprazole (NEXIUM) 40 MG capsule  Take 40 mg by mouth daily before breakfast.  .  furosemide (LASIX) 40 MG tablet  Take 40 mg by mouth daily.  .  hydrALAZINE (APRESOLINE) 25 MG tablet  Take 50 mg by mouth 3 (three) times daily.  Marland Kitchen  losartan (COZAAR) 100 MG tablet  Take 100 mg by mouth daily.  .  metFORMIN (GLUCOPHAGE) 500 MG tablet  Take 500 mg by mouth daily.  .  nitroGLYCERIN (NITROSTAT) 0.4 MG SL tablet  Place 0.4 mg under the tongue every 5 (five) minutes as needed. For chest pain - Up to three doses. Call 911 if no relief  .  omeprazole (PRILOSEC) 40 MG capsule  Take 40 mg by mouth daily.  Marland Kitchen  PARoxetine (PAXIL) 20 MG tablet  Take 20 mg by mouth daily.  Marland Kitchen  zolpidem (AMBIEN) 10 MG  tablet  Take 10 mg by mouth at bedtime as needed. For sleep       Allergies   Allergen  Reactions   .  Penicillins    Review of Systems: At the time of consultation General: normal appetite, normal energy, reports losing over 100 lbs in weight over last year since her son committed suicide  Respiratory: + cough increased over  last several days, productive of yellowish sputum, no wheezing, no hemoptysis, no pain with inspiration or cough, + exertional shortness of breath  Cardiac: occasional "twinge" of chest pain or tightness, typically associated with stress or after eating, + exertional SOB, no resting SOB, + PND, no orthopnea, no LE edema, no palpitations, 1 episode near syncope on Thanksgiving  GI: no difficulty swallowing, no hematochezia, no hematemesis, no melena, no constipation, no diarrhea  GU: no dysuria, no urgency, no frequency  Musculoskeletal: no arthritis, no arthralgia  Vascular: no pain suggestive of claudication  Neuro: no symptoms suggestive of TIA's, no seizures, no headaches, no peripheral neuropathy  Endocrine: Negative, doesn't check sugars  HEENT: no loose teeth or painful teeth, some recent vision changes attributed to glaucoma  Psych: no anxiety,+ depression since her son's suicide  Physical Exam: At the time of consultation BP 139/81  Pulse 70  Resp 18  Ht 5\' 4"  (1.626 m)  Wt 184 lb (83.462 kg)  BMI 31.58 kg/m2  SpO2 96%  General: chronically ill-appearing, appears older than stated age  HEENT: Unremarkable  Neck: no JVD, no bruits, no adenopathy  Chest: clear to auscultation, symmetrical breath sounds, no wheezes, no rhonchi  CV: RRR, no murmur  Abdomen: Obese, soft, non-tender, no masses  Extremities: warm, well-perfused, pulses not palpable at ankle  Rectal/GU Deferred  Neuro: Grossly non-focal and symmetrical throughout  Skin: Clean and dry, no rashes, no breakdown  Diagnostic Tests:  2-D echocardiogram performed 12/27/2010 is reviewed. This  demonstrates severe left ventricular dysfunction with ejection fraction estimated 20-25%. There is no significant mitral regurgitation. No other significant abnormalities are noted.  Cardiac catheterization performed by Dr. Excell Seltzer is reviewed. This demonstrates severe 80% proximal stenosis of the left anterior descending coronary artery. The high-grade stenosis occurs proximal to the takeoff of the diagonal branch and does not involve the diagonal branch. The terminal portion of the left anterior descending coronary artery appears to be reasonably good target vessel for grafting. The left circumflex coronary artery is free of significant disease. There is codominant coronary circulation. There is long segment 80% stenosis of the mid right coronary artery. The posterior descending coronary artery is somewhat small but appears to be reasonably good target vessels for grafting. There is moderate left ventricular dysfunction. Left ventricular ejection fraction is estimated 35-40%. Aortogram of the abdominal aorta demonstrates an infrarenal abdominal aortic aneurysm. There is 80% proximal stenosis of left renal artery near its origin.  CT ANGIOGRAPHY OF ABDOMEN AND PELVIS  Findings: There is eccentric nonocclusive mural thrombus in the  visualized descending thoracic aorta and suprarenal abdominal  aorta. There is a fusiform infrarenal abdominal aortic aneurysm.  Length of infrarenal neck (from lowest renal artery): 21 mm  Number of renal arteries: Right = 1; Left = 1  Diameter of infrarenal neck: 20 mm  Total length of aneurysm: 9 cm  Aneurysm ends at aortic bifurcation: No If no, Distance from  aneurysm to bifurcation: 3 cm  Greatest aneurysm diameter: 6.3 cmGreatest common iliac artery  diameters: Right =11 mm; Left = 12 mm  Diameter of common iliac arteries just above iliac bifurcation:  Right = 10 mm; Left = 12 mm  Length of common iliac arteries: Right = 6.3 cm; Left = 5.9 cm  Celiac axis:  Nonocclusive partially calcified ostial plaque,  patent distally  Superior mesenteric artery: Scattered nonocclusive partially  calcified plaque over the first 3 cm, patent distally with classic  distal branching anatomy  Left renal artery: Partially  calcified ostial plaque resulting at  least 75% diameter stenosis over a length of approximately 10 mm,  patent distally  Right renal artery: Partially calcified eccentric ostial plaque  resulting in approximately 50% diameter stenosis over a length of 1  cm, patent distally  Inferior mesenteric artery: Patent, rising from the distal aspect  of the aneurysm  1. 6.3 cm infrarenal abdominal aortic aneurysm  2. Bilateral ostial renal artery stenosis.  3. Probable cholelithiasis.    upon review of the patient and her studies she was felt to be appropriate for admission and on 03/07/2011 she underwent the following procedure:   Preoperative Diagnosis:  Severe 2-vessel Coronary Artery Disease Postoperative Diagnosis:  Same Procedure:  Coronary Artery Bypass Grafting x 3  Left Internal Mammary Artery to Distal Left Anterior Descending Coronary Artery  Saphenous Vein Graft to Distal Right Coronary Artery  Saphenous Vein Graft to First Diagonal Branch Coronary Artery  Endoscopic Vein Harvest from Left Thigh and Lower Leg Surgeon: Salvatore Decent. Cornelius Moras, MD  Assistant: Rowe Clack, PA-C  Anesthesia: Kipp Brood, MD  Operative Findings:  Moderate-severe global left ventricular dysfunction (EF < 30%)  Good quality left internal mammary artery conduit  Good quality saphenous vein conduit  Good quality target vessels for grafting   she tolerated the procedure well and was taken to the surgical intensive care unit in stable condition.  Postoperative hospital course:  The patient has done quite well. She was weaned from the ventilator without difficulty. She did have an acute postoperative renal insufficiency and was kept on low-dose dopamine.  This was able to be weaned over time. Her creatinine has continued to show improvement. Her peak creatinine was 2.03 and most recent creatinine on 03/13/2011 is 1.09. She has had a moderate postoperative volume overload but has responded well to diuretics. She will continue this for an additional 7 days as an outpatient. Additionally she does have a postoperative acute blood loss anemia. These values have also stabilized. Most recent hemoglobin on 03/13/2011 is 9.0. This is an improvement from a low of 7.7. During the postoperative period she has had hypertension which is improved over time with the resumption of her preoperative antihypertensive regimen. All routine lines, monitors, drainage devices have been discontinued in the standard fashion. Her cardiac rhythm has been stable with the exception of a short episode of paroxysmal atrial fibrillation which resolved spontaneously and has not reoccurred. She is tolerating gradually increasing activities using standard cardiac rehabilitation protocols. Capillary blood glucose have been under excellent control during the postoperative period. She is now back on her preoperative dosing of metformin. Overall her status is felt to be acceptable for discharge on today's date.     Basename 03/13/11 0535  NA 140  K 4.1  CL 106  CO2 27  GLUCOSE 105*  BUN 25*  CALCIUM 9.5    Basename 03/13/11 0535  WBC 6.7  HGB 9.0*  HCT 28.4*  PLT 308   No results found for this basename: INR:2 in the last 72 hours   Discharge Instructions:  The patient is discharged to home with extensive instructions on wound care and progressive ambulation.  They are instructed not to drive or perform any heavy lifting until returning to see the physician in his office.  Discharge Diagnosis:  CAD  Secondary Diagnosis: Patient Active Problem List  Diagnoses  . TOBACCO ABUSE  . ESSENTIAL HYPERTENSION, BENIGN  . LONG QT SYNDROME  . FATIGUE  . SNORING  . Shortness of  breath  .  Chronic systolic heart failure  . Cardiomyopathy secondary  . AAA (abdominal aortic aneurysm)  . Coronary Artery Disease  . Renal artery stenosis  . CAD (coronary artery disease)  . Melena  . Abdominal aneurysm without mention of rupture  . S/P CABG x 3  . Cholelithiasis   Past Medical History  Diagnosis Date  . 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 HTN (hypertension)   . Diabetes mellitus   . Glaucoma   . Noncompliance   . CAD (coronary artery disease)     LHC 01/04/11: dLM 20%, pLAD 90%, oD1 80%, RCA 80%, EF 35-40%.  . Ischemic cardiomyopathy   . Chronic systolic heart failure   . AAA (abdominal aortic aneurysm)     u/s 01/07/11: 5.3x5.5 cm infrarenal fusiform AAA.  Marland Kitchen HLD (hyperlipidemia)   . Chest pain 01/28/11  . SOB (shortness of breath) 01/28/11  . Angina   . Asthma   . Sleep apnea     sleep study 2 yrs ago sehv  . Renal artery stenosis     at [facility] 11/12: prox left RA 80% stenosis  . Seizures     in 20's elevated bp  . GERD (gastroesophageal reflux disease)   . Fibromyalgia        Aleks[place], [place]  Home Medication Instructions UEA:[id number]   Printed on:03/14/11 0914  Medication Information                    metFORMIN (GLUCOPHAGE) 500 MG tablet Take 500 mg by mouth daily.             PARoxetine (PAXIL) 20 MG tablet Take 20 mg by mouth daily.             zolpidem (AMBIEN) 10 MG tablet Take 10 mg by mouth at bedtime as needed. For sleep            atorvastatin (LIPITOR) 20 MG tablet Take 20 mg by mouth daily.             esomeprazole (NEXIUM) 40 MG capsule Take 40 mg by mouth daily before breakfast.             losartan (COZAAR) 100 MG tablet Take 100 mg by mouth daily.             carvedilol (COREG) 12.5 MG tablet Take 25 mg by mouth 2 (two) times daily.           albuterol (PROVENTIL HFA;VENTOLIN HFA) 108 (90 BASE) MCG/ACT inhaler Inhale 2 puffs into the lungs every 6 (six) hours as needed.             budesonide-formoterol (SYMBICORT) 80-4.5 MCG/ACT inhaler Inhale 2 puffs into the lungs. prn           aspirin EC 325 MG EC tablet Take 1 tablet (325 mg total) by mouth daily.           hydrALAZINE (APRESOLINE) 50 MG tablet Take 1 tablet (50 mg total) by mouth 3 (three) times daily.           furosemide (LASIX) 40 MG tablet Take 1 tablet (40 mg total) by mouth daily.           potassium chloride (K-DUR) 10 MEQ tablet Take 1 tablet (10 mEq total) by mouth 2 (two) times daily.           oxyCODONE (OXY IR/ROXICODONE) 5 MG immediate release tablet Take 1-2 tablets (5-10  mg total) by mouth every 6 (six) hours as needed.             Disposition: Discharged home  Patient's condition is Good  Gershon Crane, PA-C 03/14/2011  9:14 AM

## 2011-03-14 NOTE — Progress Notes (Signed)
Pt discharge instructions and education complete. No further questions. IV site d/c. Site WNL. CT sutures d/c. Site WNL. Steri strips applied. No s/s of distress. Prescriptions sent with patient. D/C via wheelchair to home with son. Lori York

## 2011-03-14 NOTE — Progress Notes (Addendum)
[address]            [place] [address]          [phone]     7 Days Post-Op  Procedure(s) (LRB): CORONARY ARTERY BYPASS GRAFTING (CABG) (N/A) Subjective: Feels better, BP control somewhat better  Objective  Telemetry NSR  Temp:  [97.2 F (36.2 C)-98.3 F (36.8 C)] 97.2 F (36.2 C) (01/31 0354) Pulse Rate:  [75-81] 79  (01/31 0354) Resp:  [18-20] 20  (01/31 0354) BP: (156-164)/(91-98) 161/91 mmHg (01/31 0354) SpO2:  [88 %-100 %] 100 % (01/31 0354) Weight:  [188 lb 0.8 oz (85.3 kg)] 188 lb 0.8 oz (85.3 kg) (01/31 0354)   Intake/Output Summary (Last 24 hours) at 03/14/11 0753 Last data filed at 03/13/11 2137  Gross per 24 hour  Intake    960 ml  Output    751 ml  Net    209 ml       General appearance: alert and no distress Heart: regular rate and rhythm and S1, S2 normal Lungs: mildly diminished in bases Abdomen: soft, nontender Extremities: minor edema Wound: incisions without signs of infection  Lab Results:  Basename 03/13/11 0535  NA 140  K 4.1  CL 106  CO2 27  GLUCOSE 105*  BUN 25*  CREATININE 1.09  CALCIUM 9.5  MG --  PHOS --   No results found for this basename: AST:2,ALT:2,ALKPHOS:2,BILITOT:2,PROT:2,ALBUMIN:2 in the last 72 hours No results found for this basename: LIPASE:2,AMYLASE:2 in the last 72 hours  Basename 03/13/11 0535  WBC 6.7  NEUTROABS --  HGB 9.0*  HCT 28.4*  MCV 88.5  PLT 308   No results found for this basename: CKTOTAL:4,CKMB:4,TROPONINI:4 in the last 72 hours No components found with this basename: POCBNP:3 No results found for this basename: DDIMER in the last 72 hours No results found for this basename: HGBA1C in the last 72 hours No results found for this basename: CHOL,HDL,LDLCALC,TRIG,CHOLHDL in the last 72 hours No results found for this basename: TSH,T4TOTAL,FREET3,T3FREE,THYROIDAB in the last 72 hours No results found for this basename:  VITAMINB12,FOLATE,FERRITIN,TIBC,IRON,RETICCTPCT in the last 72 hours  Medications: Scheduled    . aspirin EC  325 mg Oral Daily  . bisacodyl  10 mg Oral Daily   Or  . bisacodyl  10 mg Rectal Daily  . budesonide-formoterol  2 puff Inhalation BID  . carvedilol  25 mg Oral BID  . docusate sodium  200 mg Oral Daily  . hydrALAZINE  50 mg Oral TID  . insulin aspart  0-24 Units Subcutaneous TID AC & HS  . losartan  100 mg Oral Daily  . metFORMIN  500 mg Oral Daily  . pantoprazole  40 mg Oral Q1200  . PARoxetine  20 mg Oral Daily  . simvastatin  20 mg Oral q1800  . sodium chloride  3 mL Intravenous Q12H     Radiology/Studies:  No results found.  INR: Will add last result for INR, ABG once components are confirmed Will add last 4 CBG results once components are confirmed  Assessment/Plan: S/P Procedure(s) (LRB): CORONARY ARTERY BYPASS GRAFTING (CABG) (N/A)  1. Doing well overall, back on home BP meds. No further dysrhythmias. Will d/c home today also on 1 week lasix/potassium   LOS: 7 days    GOLD,WAYNE E 1/31/20137:53 AM    I have seen and examined the patient and agree with the  assessment and plan as outlined.  Leahna Hewson H 03/14/2011 8:10 AM

## 2011-03-14 NOTE — Progress Notes (Signed)
Cardiac Rehab [phone] Education completed with pt. Encouraged adhering to low sodium diabetic diet. Discussed Phase 2 for after aneurysm repair. Gave brochure. Pt states her family found rolling walker for use.Azrael Huss DunlapRN

## 2011-03-21 ENCOUNTER — Encounter: Payer: Medicare Other | Admitting: *Deleted

## 2011-03-22 ENCOUNTER — Other Ambulatory Visit: Payer: Self-pay | Admitting: Thoracic Surgery (Cardiothoracic Vascular Surgery)

## 2011-03-22 DIAGNOSIS — I2510 Atherosclerotic heart disease of native coronary artery without angina pectoris: Secondary | ICD-10-CM

## 2011-04-01 ENCOUNTER — Ambulatory Visit: Payer: Self-pay | Admitting: Thoracic Surgery (Cardiothoracic Vascular Surgery)

## 2011-04-02 ENCOUNTER — Other Ambulatory Visit: Payer: Self-pay | Admitting: *Deleted

## 2011-04-02 DIAGNOSIS — G8918 Other acute postprocedural pain: Secondary | ICD-10-CM

## 2011-04-02 MED ORDER — HYDROCODONE-ACETAMINOPHEN 7.5-500 MG PO TABS: 1.0000 | ORAL_TABLET | Freq: Four times a day (QID) | ORAL | Status: AC | PRN

## 2011-04-05 ENCOUNTER — Ambulatory Visit: Payer: Self-pay | Admitting: Thoracic Surgery (Cardiothoracic Vascular Surgery)

## 2011-04-05 ENCOUNTER — Encounter: Payer: Medicare Other | Admitting: Internal Medicine

## 2011-04-12 ENCOUNTER — Encounter: Payer: Self-pay | Admitting: Internal Medicine

## 2011-04-15 ENCOUNTER — Other Ambulatory Visit: Payer: Self-pay | Admitting: *Deleted

## 2011-04-15 DIAGNOSIS — G8918 Other acute postprocedural pain: Secondary | ICD-10-CM

## 2011-04-15 MED ORDER — HYDROCODONE-ACETAMINOPHEN 7.5-500 MG PO TABS: 1.0000 | ORAL_TABLET | Freq: Four times a day (QID) | ORAL | Status: DC | PRN

## 2011-04-17 ENCOUNTER — Other Ambulatory Visit: Payer: Self-pay | Admitting: *Deleted

## 2011-04-17 ENCOUNTER — Other Ambulatory Visit: Payer: Self-pay | Admitting: Thoracic Surgery (Cardiothoracic Vascular Surgery)

## 2011-04-17 DIAGNOSIS — I2510 Atherosclerotic heart disease of native coronary artery without angina pectoris: Secondary | ICD-10-CM

## 2011-04-17 MED ORDER — HYDRALAZINE HCL 50 MG PO TABS: 50.0000 mg | ORAL_TABLET | Freq: Three times a day (TID) | ORAL | Status: DC

## 2011-04-19 ENCOUNTER — Other Ambulatory Visit: Payer: Self-pay

## 2011-04-19 DIAGNOSIS — G8918 Other acute postprocedural pain: Secondary | ICD-10-CM

## 2011-04-19 MED ORDER — HYDROCODONE-ACETAMINOPHEN 7.5-500 MG PO TABS: 1.0000 | ORAL_TABLET | Freq: Four times a day (QID) | ORAL | Status: AC | PRN

## 2011-04-22 ENCOUNTER — Ambulatory Visit
Admission: RE | Admit: 2011-04-22 | Discharge: 2011-04-22 | Disposition: A | Discharge status: Home / Self Care | Payer: Medicare Other | Source: Ambulatory Visit | Attending: Thoracic Surgery (Cardiothoracic Vascular Surgery) | Admitting: Thoracic Surgery (Cardiothoracic Vascular Surgery)

## 2011-04-22 ENCOUNTER — Ambulatory Visit (INDEPENDENT_AMBULATORY_CARE_PROVIDER_SITE_OTHER): Payer: Self-pay | Admitting: Physician Assistant

## 2011-04-22 ENCOUNTER — Ambulatory Visit: Payer: Self-pay | Admitting: Thoracic Surgery (Cardiothoracic Vascular Surgery)

## 2011-04-22 VITALS — BP 150/92 | HR 88 | Temp 99.3°F | Resp 20 | Ht 65.0 in | Wt 178.0 lb

## 2011-04-22 DIAGNOSIS — I2510 Atherosclerotic heart disease of native coronary artery without angina pectoris: Secondary | ICD-10-CM

## 2011-04-22 DIAGNOSIS — Z951 Presence of aortocoronary bypass graft: Secondary | ICD-10-CM

## 2011-04-22 NOTE — Progress Notes (Signed)
HPI:  Patient returns for routine postoperative follow-up having undergone CABGx 3 on 03/07/2011. The patient's early postoperative recovery while in the hospital was notable for acute renal insufficiency and brief pafib. The patient reports that she recently has developed shortness of breath, chills, a low grade fever, and a nonproductive cough.    Current Outpatient Prescriptions  Medication Sig Dispense Refill  . albuterol (PROVENTIL HFA;VENTOLIN HFA) 108 (90 BASE) MCG/ACT inhaler Inhale 2 puffs into the lungs every 6 (six) hours as needed.      Marland Kitchen aspirin EC 325 MG EC tablet Take 1 tablet (325 mg total) by mouth daily.  30 tablet    . atorvastatin (LIPITOR) 20 MG tablet Take 20 mg by mouth daily.        . budesonide-formoterol (SYMBICORT) 80-4.5 MCG/ACT inhaler Inhale 2 puffs into the lungs. prn      . carvedilol (COREG) 12.5 MG tablet Take 25 mg by mouth 2 (two) times daily.      Marland Kitchen esomeprazole (NEXIUM) 40 MG capsule Take 40 mg by mouth daily before breakfast.        . furosemide (LASIX) 40 MG tablet Take 1 tablet (40 mg total) by mouth daily.  7 tablet  0  . hydrALAZINE (APRESOLINE) 50 MG tablet Take 1 tablet (50 mg total) by mouth 3 (three) times daily.  90 tablet  1  . HYDROcodone-acetaminophen (LORTAB 7.5) 7.5-500 MG per tablet Take 1 tablet by mouth every 6 (six) hours as needed for pain (may take one or two tablets every 4-6 hrs prn).  40 tablet  0  . losartan (COZAAR) 100 MG tablet Take 100 mg by mouth daily.        . metFORMIN (GLUCOPHAGE) 500 MG tablet Take 500 mg by mouth daily.        Marland Kitchen PARoxetine (PAXIL) 20 MG tablet Take 20 mg by mouth daily.        . potassium chloride (K-DUR) 10 MEQ tablet Take 1 tablet (10 mEq total) by mouth 2 (two) times daily.  7 tablet  0  . zolpidem (AMBIEN) 10 MG tablet Take 10 mg by mouth at bedtime as needed. For sleep       Vital Signs: Blood pressure 150/92, heart rate 88, surgery 20, O2 sat 91% on room air.  Physical Exam: Cardiovascular:  Regular rate and rhythm. Pulmonary: Clear on the right; diminished breath sounds on the left. No rales, rhonchi, or wheezes. Abdomen: Soft, non tender, bowel sounds presen.t Extremities: No cyanosis, clubbing, or edema. Wounds: Clean and dry. There was a small eschar removed from the left lower extremity EVH site. She had some tenderness to palpation on the right and left side of her anterior chest. There was no erythema, fluctuance, or drainage from the sternal wound.  Diagnostic Tests: He and lateral chest x-ray today shows increase in the left pleural effusion as well as atelectasis, right lung clear, no pneumothorax, and stable cardiomegaly  Impression and Plan: I've discussed proceeding with Dr. Freida Busman. We are going to arrange for the patient had a left thoracentesis done in interventional radiology at [facility] on Tuesday, 04/23/2011. Our office will contact the patient with a time. She was instructed to continue taking her Lasix and potassium supplement any Wednesday, 04/24/2010. She's going to return  to see [place] in followup on Monday, 04/29/2011 with a chest x-ray. The patient was instructed if she has worsening shortness of breath, fever, or chills she is to seek immediate medical care.

## 2011-04-23 ENCOUNTER — Other Ambulatory Visit: Payer: Self-pay

## 2011-04-23 ENCOUNTER — Other Ambulatory Visit: Payer: Self-pay | Admitting: Thoracic Surgery (Cardiothoracic Vascular Surgery)

## 2011-04-23 DIAGNOSIS — I2510 Atherosclerotic heart disease of native coronary artery without angina pectoris: Secondary | ICD-10-CM

## 2011-04-23 DIAGNOSIS — J90 Pleural effusion, not elsewhere classified: Secondary | ICD-10-CM

## 2011-04-24 ENCOUNTER — Ambulatory Visit (HOSPITAL_COMMUNITY)
Admission: RE | Admit: 2011-04-24 | Discharge: 2011-04-24 | Disposition: A | Discharge status: Home / Self Care | Payer: Medicare Other | Source: Ambulatory Visit | Attending: Thoracic Surgery (Cardiothoracic Vascular Surgery) | Admitting: Thoracic Surgery (Cardiothoracic Vascular Surgery)

## 2011-04-24 ENCOUNTER — Encounter: Payer: Medicare Other | Admitting: Internal Medicine

## 2011-04-24 ENCOUNTER — Ambulatory Visit (HOSPITAL_COMMUNITY)
Admission: RE | Admit: 2011-04-24 | Discharge: 2011-04-24 | Disposition: A | Discharge status: Home / Self Care | Payer: Medicare Other | Source: Ambulatory Visit | Attending: Physician Assistant | Admitting: Physician Assistant

## 2011-04-24 DIAGNOSIS — J90 Pleural effusion, not elsewhere classified: Secondary | ICD-10-CM

## 2011-04-24 NOTE — Procedures (Signed)
Procedure : left thoracentesis Specimen : 1.3 L yellow serous fluid Complications : none immediate   Post CXR pending

## 2011-04-25 ENCOUNTER — Other Ambulatory Visit: Payer: Self-pay | Admitting: *Deleted

## 2011-04-25 DIAGNOSIS — R05 Cough: Secondary | ICD-10-CM

## 2011-04-25 MED ORDER — HYDROCOD POLST-CHLORPHEN POLST 10-8 MG/5ML PO LQCR: 5.0000 mL | Freq: Two times a day (BID) | ORAL | Status: DC | PRN

## 2011-04-26 ENCOUNTER — Telehealth (HOSPITAL_COMMUNITY): Payer: Self-pay

## 2011-04-29 ENCOUNTER — Other Ambulatory Visit: Payer: Self-pay | Admitting: Thoracic Surgery (Cardiothoracic Vascular Surgery)

## 2011-04-29 ENCOUNTER — Ambulatory Visit
Admission: RE | Admit: 2011-04-29 | Discharge: 2011-04-29 | Disposition: A | Discharge status: Home / Self Care | Payer: Medicare Other | Source: Ambulatory Visit | Attending: Thoracic Surgery (Cardiothoracic Vascular Surgery) | Admitting: Thoracic Surgery (Cardiothoracic Vascular Surgery)

## 2011-04-29 ENCOUNTER — Ambulatory Visit (INDEPENDENT_AMBULATORY_CARE_PROVIDER_SITE_OTHER): Payer: Self-pay | Admitting: Surgical

## 2011-04-29 VITALS — BP 160/70 | Resp 18 | Ht 64.0 in | Wt 180.0 lb

## 2011-04-29 DIAGNOSIS — Z09 Encounter for follow-up examination after completed treatment for conditions other than malignant neoplasm: Secondary | ICD-10-CM

## 2011-04-29 DIAGNOSIS — J90 Pleural effusion, not elsewhere classified: Secondary | ICD-10-CM

## 2011-04-29 DIAGNOSIS — I2510 Atherosclerotic heart disease of native coronary artery without angina pectoris: Secondary | ICD-10-CM

## 2011-04-29 MED ORDER — FUROSEMIDE 40 MG PO TABS: 40.0000 mg | ORAL_TABLET | Freq: Two times a day (BID) | ORAL | Status: DC

## 2011-04-29 MED ORDER — POTASSIUM CHLORIDE ER 10 MEQ PO TBCR: 10.0000 meq | EXTENDED_RELEASE_TABLET | Freq: Two times a day (BID) | ORAL | Status: DC

## 2011-04-29 NOTE — Progress Notes (Signed)
  HPI: She is seen today in followup after her thoracentesis last week. On 04/24/2011 she had an ultrasound-guided thoracentesis and they obtained 1.3 L of fluid. She did get some initial relief, however she is now having some increasing shortness of breath with exertion. She feels as though it is relatively mild at this point.  Current Outpatient Prescriptions  Medication Sig Dispense Refill  . albuterol (PROVENTIL HFA;VENTOLIN HFA) 108 (90 BASE) MCG/ACT inhaler Inhale 2 puffs into the lungs every 6 (six) hours as needed.      Marland Kitchen aspirin EC 325 MG EC tablet Take 1 tablet (325 mg total) by mouth daily.  30 tablet    . atorvastatin (LIPITOR) 20 MG tablet Take 20 mg by mouth daily.        . budesonide-formoterol (SYMBICORT) 80-4.5 MCG/ACT inhaler Inhale 2 puffs into the lungs. prn      . carvedilol (COREG) 12.5 MG tablet Take 25 mg by mouth 2 (two) times daily.      . chlorpheniramine-HYDROcodone (TUSSIONEX PENNKINETIC ER) 10-8 MG/5ML LQCR Take 5 mLs by mouth every 12 (twelve) hours as needed.  120 mL  0  . esomeprazole (NEXIUM) 40 MG capsule Take 40 mg by mouth daily before breakfast.      . furosemide (LASIX) 40 MG tablet Take 1 tablet (40 mg total) by mouth daily.  7 tablet  0  . hydrALAZINE (APRESOLINE) 50 MG tablet Take 1 tablet (50 mg total) by mouth 3 (three) times daily.  90 tablet  1  . HYDROcodone-acetaminophen (LORTAB 7.5) 7.5-500 MG per tablet Take 1 tablet by mouth every 6 (six) hours as needed for pain (may take one or two tablets every 4-6 hrs prn).  40 tablet  0  . losartan (COZAAR) 100 MG tablet Take 100 mg by mouth daily.        Marland Kitchen PARoxetine (PAXIL) 20 MG tablet Take 5 mg by mouth daily.       . potassium chloride (K-DUR) 10 MEQ tablet Take 1 tablet (10 mEq total) by mouth 2 (two) times daily.  7 tablet  0  . zolpidem (AMBIEN) 10 MG tablet Take 10 mg by mouth at bedtime as needed. For sleep       . metFORMIN (GLUCOPHAGE) 500 MG tablet Take 500 mg by mouth daily.           Physical Exam:  General appearance - appears somewhat chronically ill, no acute distress Chest - diminished breath sounds in the left base Heart - normal rate and regular rhythm, S1 and S2 normal, no murmurs noted, no gallops noted Extremities - 1+ lower extremity edema Skin - incisions healing well without evidence of infection  Diagnostic Tests: Chest x-ray was obtained on today's date reveals reaccumulation of left-sided fluid with some possible atelectatic component.  Impression: Recurrent left pleural effusion  Plan: Discuss with Dr. Tressie Stalker. We will repeat the thoracentesis one more time. If the fluid then reaccumulate quickly, will probably require a Pleurx catheter to be placed. Additionally will increase Lasix from 40 mg once a day to twice a day, as well as doubling her potassium supplement. We will see in one week to recheck chest x-ray.

## 2011-04-29 NOTE — Patient Instructions (Signed)
Patient have repeat thoracentesis with followup in one week with a chest x-ray.

## 2011-04-30 ENCOUNTER — Other Ambulatory Visit (HOSPITAL_COMMUNITY): Payer: Medicare Other

## 2011-05-02 ENCOUNTER — Ambulatory Visit (HOSPITAL_COMMUNITY)
Admission: RE | Admit: 2011-05-02 | Discharge: 2011-05-02 | Disposition: A | Discharge status: Home / Self Care | Payer: Medicare Other | Source: Ambulatory Visit | Attending: Radiology | Admitting: Radiology

## 2011-05-02 ENCOUNTER — Ambulatory Visit (HOSPITAL_COMMUNITY)
Admission: RE | Admit: 2011-05-02 | Discharge: 2011-05-02 | Disposition: A | Discharge status: Home / Self Care | Payer: Medicare Other | Source: Ambulatory Visit | Attending: Thoracic Surgery (Cardiothoracic Vascular Surgery) | Admitting: Thoracic Surgery (Cardiothoracic Vascular Surgery)

## 2011-05-02 DIAGNOSIS — J90 Pleural effusion, not elsewhere classified: Secondary | ICD-10-CM | POA: Insufficient documentation

## 2011-05-02 NOTE — Procedures (Signed)
Left Thoracentesis US guided  1 liter blood tinged fluid No labs  Pt tolerated well CXR pending

## 2011-05-06 ENCOUNTER — Ambulatory Visit
Admission: RE | Admit: 2011-05-06 | Discharge: 2011-05-06 | Disposition: A | Discharge status: Home / Self Care | Payer: Medicare Other | Source: Ambulatory Visit | Attending: Thoracic Surgery (Cardiothoracic Vascular Surgery) | Admitting: Thoracic Surgery (Cardiothoracic Vascular Surgery)

## 2011-05-06 ENCOUNTER — Telehealth (HOSPITAL_COMMUNITY): Payer: Self-pay

## 2011-05-06 ENCOUNTER — Ambulatory Visit (INDEPENDENT_AMBULATORY_CARE_PROVIDER_SITE_OTHER): Payer: Self-pay | Admitting: Physician Assistant

## 2011-05-06 VITALS — BP 176/108 | HR 96 | Temp 97.3°F | Resp 20 | Ht 64.0 in | Wt 168.0 lb

## 2011-05-06 DIAGNOSIS — Z9889 Other specified postprocedural states: Secondary | ICD-10-CM

## 2011-05-06 DIAGNOSIS — F411 Generalized anxiety disorder: Secondary | ICD-10-CM

## 2011-05-06 DIAGNOSIS — J90 Pleural effusion, not elsewhere classified: Secondary | ICD-10-CM

## 2011-05-06 DIAGNOSIS — F419 Anxiety disorder, unspecified: Secondary | ICD-10-CM

## 2011-05-06 MED ORDER — LORAZEPAM 0.5 MG PO TABS: 0.5000 mg | ORAL_TABLET | Freq: Three times a day (TID) | ORAL | Status: DC | PRN

## 2011-05-06 NOTE — Progress Notes (Signed)
  HPI: Mrs. Jolin presents today for follow up S/P Thoracentesis performed 05/02/2011 at which time they removed 1L of blood tinged serosaguinous fluid.  She originally underwent CABG x3 03/07/2011 and since then has had recurrent pleural effusions.  The patient states that she feels bad today.  She states that she is sore in her lungs.  She denies chest pain and shortness of breath.       Current Outpatient Prescriptions  Medication Sig Dispense Refill  . albuterol (PROVENTIL HFA;VENTOLIN HFA) 108 (90 BASE) MCG/ACT inhaler Inhale 2 puffs into the lungs every 6 (six) hours as needed.      Marland Kitchen aspirin EC 325 MG EC tablet Take 1 tablet (325 mg total) by mouth daily.  30 tablet    . atorvastatin (LIPITOR) 20 MG tablet Take 20 mg by mouth daily.        . budesonide-formoterol (SYMBICORT) 80-4.5 MCG/ACT inhaler Inhale 2 puffs into the lungs. prn      . carvedilol (COREG) 12.5 MG tablet Take 25 mg by mouth 2 (two) times daily.      . chlorpheniramine-HYDROcodone (TUSSIONEX PENNKINETIC ER) 10-8 MG/5ML LQCR Take 5 mLs by mouth every 12 (twelve) hours as needed.  120 mL  0  . furosemide (LASIX) 40 MG tablet Take 40 mg by mouth 2 (two) times daily.      . furosemide (LASIX) 40 MG tablet Take 1 tablet (40 mg total) by mouth 2 (two) times daily.  60 tablet  11  . hydrALAZINE (APRESOLINE) 50 MG tablet Take 1 tablet (50 mg total) by mouth 3 (three) times daily.  90 tablet  1  . losartan (COZAAR) 100 MG tablet Take 100 mg by mouth daily.        . metFORMIN (GLUCOPHAGE) 500 MG tablet Take 500 mg by mouth daily.        Marland Kitchen PARoxetine (PAXIL) 20 MG tablet Take 5 mg by mouth daily.       . potassium chloride (K-DUR) 10 MEQ tablet Take 20 mEq by mouth 2 (two) times daily.      . potassium chloride (K-DUR) 10 MEQ tablet Take 1 tablet (10 mEq total) by mouth 2 (two) times daily.      Marland Kitchen zolpidem (AMBIEN) 10 MG tablet Take 10 mg by mouth at bedtime as needed. For sleep       . esomeprazole (NEXIUM) 40 MG capsule Take 40 mg  by mouth daily before breakfast.        Physical Exam:  BP 176/108  Pulse 96  Temp(Src) 97.3 F (36.3 C) (Oral)  Resp 20  Ht 5\' 4"  (1.626 m)  Wt 168 lb (76.204 kg)  BMI 28.84 kg/m2  SpO2 95%  Gen: no apparent distress Heart: RRR Lungs: CTA bilaterally, absent Left Base  Diagnostic Tests: CXR: shows minor re-accumulation of pleural fluid on the Left side  Impression:  Minor re-accumulation of pleural fluid on the left.  Patient is asymptomatic at this time  Plan:   Left pleural effusion is minimal at this time.  We will continue Lasix and bring patient back to the office in one months time for repeat CXR.  Should the patient develop worsening or persistent shortness of breath she was instructed to call our office so that she may been evaluated sooner.

## 2011-05-06 NOTE — Progress Notes (Deleted)
  HPI:  Patient returns for routine postoperative follow-up having undergone wound debridement with placement of a wound vac for RLE cellulitis.  This was performed on  The patient's early postoperative recovery while in the hospital was notable for Since hospital discharge the patient reports   Current Outpatient Prescriptions  Medication Sig Dispense Refill  . albuterol (PROVENTIL HFA;VENTOLIN HFA) 108 (90 BASE) MCG/ACT inhaler Inhale 2 puffs into the lungs every 6 (six) hours as needed.      Marland Kitchen aspirin EC 325 MG EC tablet Take 1 tablet (325 mg total) by mouth daily.  30 tablet    . atorvastatin (LIPITOR) 20 MG tablet Take 20 mg by mouth daily.        . budesonide-formoterol (SYMBICORT) 80-4.5 MCG/ACT inhaler Inhale 2 puffs into the lungs. prn      . carvedilol (COREG) 12.5 MG tablet Take 25 mg by mouth 2 (two) times daily.      . chlorpheniramine-HYDROcodone (TUSSIONEX PENNKINETIC ER) 10-8 MG/5ML LQCR Take 5 mLs by mouth every 12 (twelve) hours as needed.  120 mL  0  . esomeprazole (NEXIUM) 40 MG capsule Take 40 mg by mouth daily before breakfast.      . furosemide (LASIX) 40 MG tablet Take 40 mg by mouth 2 (two) times daily.      . furosemide (LASIX) 40 MG tablet Take 1 tablet (40 mg total) by mouth 2 (two) times daily.  60 tablet  11  . hydrALAZINE (APRESOLINE) 50 MG tablet Take 1 tablet (50 mg total) by mouth 3 (three) times daily.  90 tablet  1  . LORazepam (ATIVAN) 0.5 MG tablet Take 1 tablet (0.5 mg total) by mouth every 8 (eight) hours as needed for anxiety.  30 tablet  0  . losartan (COZAAR) 100 MG tablet Take 100 mg by mouth daily.        . metFORMIN (GLUCOPHAGE) 500 MG tablet Take 500 mg by mouth daily.        Marland Kitchen PARoxetine (PAXIL) 20 MG tablet Take 5 mg by mouth daily.       . potassium chloride (K-DUR) 10 MEQ tablet Take 20 mEq by mouth 2 (two) times daily.      . potassium chloride (K-DUR) 10 MEQ tablet Take 1 tablet (10 mEq total) by mouth 2 (two) times daily.      Marland Kitchen zolpidem  (AMBIEN) 10 MG tablet Take 10 mg by mouth at bedtime as needed. For sleep         Physical Exam  Diagnostic Tests:   Impression:  Plan:

## 2011-05-07 ENCOUNTER — Telehealth: Payer: Self-pay | Admitting: *Deleted

## 2011-05-07 NOTE — Telephone Encounter (Signed)
Message copied by Melene Plan on Tue May 07, 2011 11:17 AM ------      Message from: [name] E      Created: Thu May 02, 2011  5:19 PM       Can you schedule her for Appt to consider AAA repair      ----- Message -----         From: Purcell Nails, MD         Sent: 05/02/2011   3:15 PM           To: Sherren Kerns, MD            She has had a left pleural effusion that we are tapping for a second time.  She is otherwise quite stable and looks fine.  If you are going to do her with a stent graft you could proceed at any time.  If you are thinking of doing her open I might wait a little longer.            ----- Message -----         From: Sherren Kerns, MD         Sent: 05/02/2011   2:52 PM           To: Purcell Nails, MD            Cub,            Just wanted to touch base with you on Ms Hegg.  Saw that she was still having some pulmonary issues.  Is she well enough for AAA repair yet?            Leonette Most

## 2011-05-07 NOTE — Telephone Encounter (Signed)
Pt has appt with Dr Darrick Penna on 05/16/11 for followup and evaluation of AAA

## 2011-05-15 ENCOUNTER — Encounter: Payer: Self-pay | Admitting: Vascular Surgery

## 2011-05-16 ENCOUNTER — Ambulatory Visit: Payer: Medicare Other | Admitting: Vascular Surgery

## 2011-05-29 ENCOUNTER — Other Ambulatory Visit: Payer: Self-pay | Admitting: *Deleted

## 2011-05-29 ENCOUNTER — Other Ambulatory Visit: Payer: Self-pay | Admitting: Thoracic Surgery (Cardiothoracic Vascular Surgery)

## 2011-05-29 DIAGNOSIS — G8918 Other acute postprocedural pain: Secondary | ICD-10-CM

## 2011-05-29 DIAGNOSIS — I2510 Atherosclerotic heart disease of native coronary artery without angina pectoris: Secondary | ICD-10-CM

## 2011-05-29 MED ORDER — HYDROCODONE-ACETAMINOPHEN 5-500 MG PO TABS: 1.0000 | ORAL_TABLET | ORAL | Status: AC | PRN

## 2011-05-29 MED ORDER — POTASSIUM CHLORIDE ER 10 MEQ PO TBCR: 20.0000 meq | EXTENDED_RELEASE_TABLET | Freq: Two times a day (BID) | ORAL | Status: DC

## 2011-05-29 NOTE — Telephone Encounter (Signed)
Mrs. Lori York  CABG was done in January 2013.  She is still experiencing pulling sensations in her upper sternum that she takes Hydrocodone for.  I told her she needs to discuss this with Dr. Sheilah Pigeon.A. at her next visit.  She agrees.

## 2011-05-30 ENCOUNTER — Other Ambulatory Visit: Payer: Self-pay | Admitting: Internal Medicine

## 2011-05-30 MED ORDER — HYDRALAZINE HCL 50 MG PO TABS: 50.0000 mg | ORAL_TABLET | Freq: Three times a day (TID) | ORAL | Status: DC

## 2011-06-03 ENCOUNTER — Encounter: Payer: Self-pay | Admitting: Thoracic Surgery (Cardiothoracic Vascular Surgery)

## 2011-06-06 ENCOUNTER — Ambulatory Visit
Admission: RE | Admit: 2011-06-06 | Discharge: 2011-06-06 | Disposition: A | Discharge status: Home / Self Care | Payer: Medicare Other | Source: Ambulatory Visit | Attending: Thoracic Surgery (Cardiothoracic Vascular Surgery) | Admitting: Thoracic Surgery (Cardiothoracic Vascular Surgery)

## 2011-06-06 DIAGNOSIS — I2510 Atherosclerotic heart disease of native coronary artery without angina pectoris: Secondary | ICD-10-CM

## 2011-06-07 ENCOUNTER — Encounter: Payer: Medicare Other | Admitting: Thoracic Surgery (Cardiothoracic Vascular Surgery)

## 2011-06-20 ENCOUNTER — Other Ambulatory Visit: Payer: Self-pay | Admitting: Thoracic Surgery (Cardiothoracic Vascular Surgery)

## 2011-06-20 DIAGNOSIS — J90 Pleural effusion, not elsewhere classified: Secondary | ICD-10-CM

## 2011-06-24 ENCOUNTER — Ambulatory Visit (INDEPENDENT_AMBULATORY_CARE_PROVIDER_SITE_OTHER): Payer: Medicare Other | Admitting: Thoracic Surgery (Cardiothoracic Vascular Surgery)

## 2011-06-24 ENCOUNTER — Ambulatory Visit
Admission: RE | Admit: 2011-06-24 | Discharge: 2011-06-24 | Disposition: A | Discharge status: Home / Self Care | Payer: Medicare Other | Source: Ambulatory Visit | Attending: Thoracic Surgery (Cardiothoracic Vascular Surgery) | Admitting: Thoracic Surgery (Cardiothoracic Vascular Surgery)

## 2011-06-24 ENCOUNTER — Encounter: Payer: Self-pay | Admitting: Thoracic Surgery (Cardiothoracic Vascular Surgery)

## 2011-06-24 VITALS — BP 132/85 | HR 80 | Resp 20 | Ht 64.0 in | Wt 160.0 lb

## 2011-06-24 DIAGNOSIS — J90 Pleural effusion, not elsewhere classified: Secondary | ICD-10-CM

## 2011-06-24 DIAGNOSIS — Z951 Presence of aortocoronary bypass graft: Secondary | ICD-10-CM

## 2011-06-24 NOTE — Progress Notes (Signed)
                   [address]            [name] [address]          [phone]     CARDIOTHORACIC SURGERY OFFICE NOTE  Referring Provider is [name], MD PCP is Gwynneth Aliment, MD, MD   HPI:  Patient returns for followup status post coronary artery bypass grafting in January with recurrent left pleural effusion that required thoracentesis.  Her last thoracentesis was done in March and since then her effusion has not recurred. She returns to the office today reporting that she feels well. She has no shortness of breath.  She has no significant chest pain.   Current Outpatient Prescriptions  Medication Sig Dispense Refill  . aspirin EC 325 MG EC tablet Take 1 tablet (325 mg total) by mouth daily.  30 tablet    . carvedilol (COREG) 12.5 MG tablet Take 25 mg by mouth 2 (two) times daily.      . chlorpheniramine-HYDROcodone (TUSSIONEX PENNKINETIC ER) 10-8 MG/5ML LQCR Take 5 mLs by mouth every 12 (twelve) hours as needed.  120 mL  0  . esomeprazole (NEXIUM) 40 MG capsule Take 40 mg by mouth daily before breakfast.      . furosemide (LASIX) 40 MG tablet Take 40 mg by mouth 2 (two) times daily.      Marland Kitchen losartan (COZAAR) 100 MG tablet Take 100 mg by mouth daily.        . metFORMIN (GLUCOPHAGE) 500 MG tablet Take 500 mg by mouth daily.        Marland Kitchen PARoxetine (PAXIL) 20 MG tablet Take 5 mg by mouth daily.       Marland Kitchen zolpidem (AMBIEN) 10 MG tablet Take 10 mg by mouth at bedtime as needed. For sleep       . DISCONTD: furosemide (LASIX) 40 MG tablet Take 1 tablet (40 mg total) by mouth 2 (two) times daily.  60 tablet  11      Physical Exam:   BP 132/85  Pulse 80  Resp 20  Ht 5\' 4"  (1.626 m)  Wt 160 lb (72.576 kg)  BMI 27.46 kg/m2  SpO2 98%  General:  Well-appearing  Chest:   Clear to auscultation with symmetrical breath sounds  CV:   Regular rate and rhythm without murmur  Incisions:  Sternal scar has healed nicely and the sternum is stable  Abdomen:  Soft and  nontender  Extremities:  Warm and well-perfused  Diagnostic Tests:  Chest x-ray performed today demonstrates clear lung fields bilaterally with no recurrence of her pleural effusion. All the sternal wires appear intact. No other abnormalities are noted.   Impression:  The patient is doing well 5 months status post coronary artery bypass grafting. She has not had any recurrence of her left pleural effusion. She is a very large abdominal aortic aneurysm that should be fixed as soon as practical.  She missed her last appointment with Dr. Darrick Penna.  Plan:  I've strongly encourage the patient to contact Dr. Darrick Penna office to reschedule an appointment so that she can get her aneurysm fixed. In the future she will call and return to see [place] as needed.   Salvatore Decent. Cornelius Moras, MD 06/24/2011 4:50 PM

## 2011-06-24 NOTE — Patient Instructions (Signed)
Call Dr Darrick Penna to set up aneurysm surgery ASAP

## 2011-06-27 ENCOUNTER — Other Ambulatory Visit: Payer: Self-pay

## 2011-06-27 DIAGNOSIS — I714 Abdominal aortic aneurysm, without rupture: Secondary | ICD-10-CM

## 2011-06-28 ENCOUNTER — Telehealth: Payer: Self-pay | Admitting: Vascular Surgery

## 2011-06-28 NOTE — Telephone Encounter (Addendum)
Message copied by Shari Prows on Fri Jun 28, 2011  9:45 AM ------      Message from: Phillips Odor      Created: Thu Jun 27, 2011  3:06 PM      Regarding: sched appt at 8:30 5/23 w/ CEF      Contact: [phone]       Pt. Need to see CEF for discussion of abdominal aneurysm repair. I scheduled an appt for this patient to see CEF on Thurs 07/04/11 at 8:30am and to have a CTA on Wed 07/03/11 at 1:45pm at [facility]. I left message for the patient regarding this. Jacklyn Shell

## 2011-07-01 ENCOUNTER — Other Ambulatory Visit: Payer: Self-pay | Admitting: Vascular Surgery

## 2011-07-01 ENCOUNTER — Telehealth: Payer: Self-pay | Admitting: Vascular Surgery

## 2011-07-01 NOTE — Telephone Encounter (Signed)
I spoke w/ patient today at 10:45am regarding her appts for CTA on Wed 07/03/11 at 2pm at [facility]. She is aware that she needs blood work today at [facility]. I faxed order to them for this bloodwork. She is to come and see CEF on Thursday 07/04/11 at 8:30am.  Jacklyn Shell

## 2011-07-02 LAB — CREATININE, SERUM: Creat: 1.03 mg/dL (ref 0.50–1.10)

## 2011-07-03 ENCOUNTER — Encounter: Payer: Self-pay | Admitting: Vascular Surgery

## 2011-07-03 ENCOUNTER — Ambulatory Visit
Admission: RE | Admit: 2011-07-03 | Discharge: 2011-07-03 | Disposition: A | Discharge status: Home / Self Care | Payer: Medicare Other | Source: Ambulatory Visit | Attending: Vascular Surgery | Admitting: Vascular Surgery

## 2011-07-03 DIAGNOSIS — I714 Abdominal aortic aneurysm, without rupture: Secondary | ICD-10-CM

## 2011-07-03 MED ORDER — IOHEXOL 350 MG/ML SOLN
100.0000 mL | Freq: Once | INTRAVENOUS | Status: AC | PRN
  Administered 2011-07-03: 100 mL via INTRAVENOUS

## 2011-07-04 ENCOUNTER — Other Ambulatory Visit: Payer: Self-pay | Admitting: Cardiology

## 2011-07-04 ENCOUNTER — Other Ambulatory Visit: Payer: Self-pay

## 2011-07-04 ENCOUNTER — Encounter: Payer: Self-pay | Admitting: Vascular Surgery

## 2011-07-04 ENCOUNTER — Ambulatory Visit (INDEPENDENT_AMBULATORY_CARE_PROVIDER_SITE_OTHER): Payer: Medicare Other | Admitting: Vascular Surgery

## 2011-07-04 VITALS — BP 194/121 | HR 77 | Temp 98.6°F | Ht 64.0 in | Wt 161.0 lb

## 2011-07-04 DIAGNOSIS — I714 Abdominal aortic aneurysm, without rupture: Secondary | ICD-10-CM

## 2011-07-04 NOTE — Progress Notes (Signed)
[facility] [place] HISTORY AND PHYSICAL   History of Present Illness:  Patient is a 66 y.o. year old female who presents for evaluation of abdominal aortic aneurysm she was last seen in December of 2012 at which time her aneurysm was 6 cm in diameter. However, she also had three-vessel coronary disease and recently underwent coronary bypass grafting by Dr. Cornelius Moras. She also has a previous history of an AICD. She continues to deny any abdominal or back pain. CT angiogram repeated yesterday which shows aneurysm was 6.2 cm in diameter. There is some thrombus in the neck. There is tortuosity of the left iliac system. There is no evidence of rupture.  Other medical problems include , coronary disease, ischemic cardiomyopathy, hyperlipidemia, asthma. She states that she has quit smoking. All of her medical problems are currently controlled.  Past Medical History  Diagnosis Date  . 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 HTN (hypertension)   . Diabetes mellitus   . Glaucoma   . Noncompliance   . CAD (coronary artery disease)     LHC 01/04/11: dLM 20%, pLAD 90%, oD1 80%, RCA 80%, EF 35-40%.  . Ischemic cardiomyopathy   . Chronic systolic heart failure   . AAA (abdominal aortic aneurysm)     u/s 01/07/11: 5.3x5.5 cm infrarenal fusiform AAA.  Marland Kitchen HLD (hyperlipidemia)   . Chest pain 01/28/11  . SOB (shortness of breath) 01/28/11  . Angina   . Asthma   . Sleep apnea     sleep study 2 yrs ago sehv  . Renal artery stenosis     at [facility] 11/12: prox left RA 80% stenosis  . Seizures     in 20's elevated bp  . GERD (gastroesophageal reflux disease)   . Fibromyalgia   . Anemia   . Peripheral vascular disease     Past Surgical History  Procedure Date  . Cardiac defibrillator placement     in 1997 with revision to a  Guidant model 1860 single chamber implantable cardioverter- defib on Feb18, 2003, most recent generator change 2/11 by Dr Johney Frame [facility]  . Laminectomy May  2008  . Right knee resconstructuion 1970  . Laminectomy 11/20/07     LF5-S1 fusion  . Back surgery   . Tonsillectomy   . Abdominal hysterectomy 88  . Hemorroidectomy 77  . Coronary artery bypass graft 03/07/2011    Procedure: CORONARY ARTERY BYPASS GRAFTING (CABG);  Surgeon: Purcell Nails, MD;  Location: [facility] OR;  Service: Open Heart Surgery;  Laterality: N/A;  Coronary artery bypass graft times three using left internal mammary artery and left leg greater saphenous vein harvested endoscopically     Social History History  Substance Use Topics  . Smoking status: Former Smoker -- 0.3 packs/day    Types: Cigarettes    Quit date: 01/12/2011  . Smokeless tobacco: Never Used   Comment:    . Alcohol Use: No    Family History Family History  Problem Relation Age of Onset  . Long QT syndrome Mother   . Heart disease Mother     before age 62  . Hypertension Mother   . Long QT syndrome Sister   . Heart disease Sister   . Long QT syndrome Brother   . Heart disease Brother   . Heart attack Brother   . Long QT syndrome Daughter   . Heart disease Daughter   . Coronary artery disease Sister 73  Allergies  Allergies  Allergen Reactions  . Penicillins      Current Outpatient Prescriptions  Medication Sig Dispense Refill  . aspirin EC 325 MG EC tablet Take 1 tablet (325 mg total) by mouth daily.  30 tablet    . carvedilol (COREG) 12.5 MG tablet Take 25 mg by mouth 2 (two) times daily.      Marland Kitchen esomeprazole (NEXIUM) 40 MG capsule Take 40 mg by mouth daily before breakfast.      . furosemide (LASIX) 40 MG tablet Take 40 mg by mouth 2 (two) times daily.      Marland Kitchen losartan (COZAAR) 100 MG tablet Take 100 mg by mouth daily.        Marland Kitchen PARoxetine (PAXIL) 20 MG tablet Take 5 mg by mouth daily.       Marland Kitchen zolpidem (AMBIEN) 10 MG tablet Take 10 mg by mouth at bedtime as needed. For sleep       . chlorpheniramine-HYDROcodone (TUSSIONEX PENNKINETIC ER) 10-8 MG/5ML LQCR Take 5 mLs by mouth every  12 (twelve) hours as needed.  120 mL  0  . metFORMIN (GLUCOPHAGE) 500 MG tablet Take 500 mg by mouth daily.         No current facility-administered medications for this visit.   Facility-Administered Medications Ordered in Other Visits  Medication Dose Route Frequency Provider Last Rate Last Dose  . iohexol (OMNIPAQUE) 350 MG/ML injection 100 mL  100 mL Intravenous Once PRN Medication Radiologist, MD   100 mL at 07/03/11 1436    ROS:   General:  No weight loss, Fever, chills  HEENT: No recent headaches, no nasal bleeding, no visual changes, no sore throat  Neurologic: No dizziness, blackouts, seizures. No recent symptoms of stroke or mini- stroke. No recent episodes of slurred speech, or temporary blindness.  Cardiac: No recent episodes of chest pain/pressure, no shortness of breath at rest.  No shortness of breath with exertion  Vascular: No history of rest pain in feet.  No history of claudication.  No history of non-healing ulcer, No history of DVT   Pulmonary: No home oxygen, no productive cough, no hemoptysis,  Musculoskeletal:  [ ]  Arthritis, [ ]  Low back pain,  [ ]  Joint pain  Hematologic:No history of hypercoagulable state.  No history of easy bleeding.  No history of anemia  Gastrointestinal: No hematochezia or melena,  No gastroesophageal reflux, no trouble swallowing  Urinary: [ ]  chronic Kidney disease, [ ]  on HD - [ ]  MWF or [ ]  TTHS, [ ]  Burning with urination, [ ]  Frequent urination, [ ]  Difficulty urinating;   Skin: No rashes  Psychological: No history of anxiety,  No history of depression   Physical Examination  Filed Vitals:   07/04/11 0849  BP: 194/121  Pulse: 77  Temp: 98.6 F (37 C)  TempSrc: Oral  Height: 5\' 4"  (1.626 m)  Weight: 161 lb (73.029 kg)  SpO2: 100%    Body mass index is 27.64 kg/(m^2).  General:  Alert and oriented, no acute distress HEENT: Normal Neck: No bruit or JVD Pulmonary: Clear to auscultation bilaterally Cardiac:  Regular Rate and Rhythm without murmur Abdomen: Soft, non-tender, non-distended, no mass, lower midline scar Skin: No rash Extremity Pulses:  2+ radial, brachial, femoral, dorsalis pedis bilaterally Musculoskeletal: No deformity or edema  Neurologic: Upper and lower extremity motor 5/5 and symmetric   ASSESSMENT:   CT findings were discussed with the patient today. I believe that she would be a candidate for ureter endovascular  or open abdominal aortic aneurysm repair. She has opted for open repair at this point because she does not necessarily want to return for the followup that is required for stent grafting. Most likely this can be done with a tube graft repair but may require aortobiiliac grafting. Risks benefits possible complications and procedure details including but limited to bleeding infection need for transfusion myocardial events worse when the patient today she understands and agrees to proceed.  I do not believe she necessarily needs repeat cardiac risk stratification by the fact she recently underwent coronary bypass grafting.   PLAN:  Open abdominal aortic aneurysm repair 07/15/2011  [name], MD [facility] [place] Office: [phone] Pager: [phone]

## 2011-07-05 ENCOUNTER — Encounter (HOSPITAL_COMMUNITY): Payer: Self-pay | Admitting: Certified Registered"

## 2011-07-05 ENCOUNTER — Encounter (HOSPITAL_COMMUNITY): Payer: Self-pay | Admitting: Pharmacy Technician

## 2011-07-05 NOTE — Anesthesia Preprocedure Evaluation (Signed)
Anesthesia Evaluation Anesthesia Physical Anesthesia Plan Anesthesia Quick Evaluation  

## 2011-07-09 NOTE — Consult Note (Addendum)
Anesthesia Chart Review:  Patient is a 66 year old female scheduled for aortoiliac bypass for 6.2 cm AAA repair on 07/15/11.  However, I just spoke with Lori York and now she is considering endovascular repair, but is waiting to hear back from Dr. Darrick Penna.  Her PAT appointment is scheduled for 07/10/11.    She has an extensive cardiac history.  In fact, she was noted to have > 6 cm AAA since at least December 2012, but had symptomatic 3V CAD and has since undergone CABG X 3 (LIMA -> LAD, SVG-> RCA, SVG-> DIAG1) by Dr. Cornelius Moras on 03/07/11.  She required left sided thoracentesis on 05/02/11.  Other history includes long QT syndrome, s/p Environmental [employer] AICD, ischemic CM, chronic systolic heart failure, DM2, HTN, glaucoma, HLD, OSA, bilateral RAS by 01/31/11 CT, seizures, GERD, fibromyalgia, recent smoker (quit prior to CABG).  PCP is Dr. Dorothyann Peng.  Her Cardiologist is listed as Dr. [name].  However, she reports that Dr. Johney Frame is both her primary and EP Cardiologist.  She has not seen either since being discharged after her CABG.  She denies SOB, chest pain, edema, or ICD shocks.  She is not in cardiac rehab, but is able to walk 1/4 mile around her neighborhood without difficulty.  EKG from 03/08/11 showed NSR, LVH, nonspecific anterolateral T wave abnormality, long QT.    2D Echo on 12/27/10 showed:  - Left ventricle: Diffuse hypokinesis with septal and apical akinesis The cavity size was severely dilated. Systolic function was severely reduced. The estimated ejection fraction was in the range of 20% to 25%. - Pulmonary arteries: PA peak pressure: 36mm Hg (S).  She also had an intra-operative TEE by Dr. Noreene Larsson on 03/07/11 that showed: PRE-BYPASS FINDINGS:  1. Aortic valve - aortic valve is trileaflet. The leaflets opened normally there was no aortic insufficiency. There was no thickening of the leaflets noted.  2. Mitral valve - the mitral valve opened normally without prolapse or fluttering  and there was trace mitral insufficiency.  3. Left ventricle - left ventricle was dilated and measured 5.63 cm at end diastole at the mid-papillary level in the short axis view. There was global left ventricular hypokinesis but no obvious akinetic or dyskinetic segments and ejection fraction was estimated at 30-35%. No thrombus noted in the left ventricular apex. The left ventricular wall thickness measured 1.1-1.15 centimeters at end diastole at the mid-papillary level.  4. Right ventricle - right ventricular was of normal size there was normal contractility of the right ventricular free wall.  5. Tricuspid valve: there was a defibrillator lead noted to be through the tricuspid valve but the valve appeared structurally intact and there was trace tricuspid insufficiency.  6. Interatrial septum -the intra-atrial septum is intact without evidence of patent foramen ovale or atrial septal defect by color Doppler or bubble study.  7. Left atrium - there was no thrombus noted in the left atrium.  8. Left atrial appendage the left atrial appendage was well visualized and there was no thrombus noted.  9. Ascending aorta -descending aorta is non-aneurysmal and there is no significant atheromatous disease noted.  10. Descending aorta - there was moderate to severe atheromatous disease noted in the descending aorta.  11. Pericardium - there was a small pericardial effusion noted  POST-BYPASS FINDINGS:  Aortic valve - the aortic valve appeared to change from the pre-bypass study and was normal  Mitral valve - unchanged from the pre-bypass study with trace mitral insufficiency  Left ventricle -  EF 30-35% there was global left ventricular hypokinesis. Following separation from coronary artery bypass there was a pocket of air noted in the left ventricular apex which resolved with de-airing procedures.  Right ventricle - upon separation from cardiopulmonary bypass as right ventricular dysfunction noted initially but  this resolved over the next 10-15 minutes.  Tricuspid valve - trace tricuspid insufficiency.  IPericardium - no residual pericardial effusion noted.  No other significant changes from pre-bypass findings.   01/04/11 cath (See Notes tab, Op Note) showed:  1. Severe proximal LAD stenosis  2. Severe mid right coronary artery stenosis  3. Nonobstructive left circumflex stenosis  4. Moderately severe segmental left ventricular systolic dysfunction  5. Infrarenal abdominal aortic aneurysm.  6. EF estimated at 35-40%.   CXR on 06/24/11 showed: No residual pleural effusion with minimal residual linear  atelectasis within the left mid lung zone.  Her last two BP readings from 06/24/11 and 07/04/11 were 132/85 and 194/121 (actual versus entry error), respectively. Vitals will be rechecked at her PAT appointment on 07/10/11. She will also get labs at that time.  She is four months out from her CABG and has yet to be evaluated by Cardiology, although she has had several visits with CT surgeon Dr. Cornelius Moras.  Recommend Cardiology input pre-operatively.  Reviewed with Anesthesiology Dr. Michelle Piper who agrees.  I notified Lori Done, Lori York at [facility].  She is waiting to discuss the operative plan (open vs EVAR) with Dr. Darrick Penna.  Surgery may be delayed if plan is changed to EVAR as this will need to be coordinated with a stent graft Representative.  Either way, Lori York is aware that the patient will need Cardiology evaluation pre-operatively.  Shonna Chock, PA-C

## 2011-07-10 ENCOUNTER — Inpatient Hospital Stay (HOSPITAL_COMMUNITY): Admission: RE | Admit: 2011-07-10 | Payer: Medicare Other | Source: Ambulatory Visit

## 2011-07-10 ENCOUNTER — Telehealth: Payer: Self-pay

## 2011-07-10 NOTE — Telephone Encounter (Signed)
Pt. called office on 07/09/11 to state she has changed her mind regarding having the open AAA repair on 07/15/11.  States she is going through some personal issues, and doesn't feel she is able to go through such a big surgery at this time.  Is requesting to inform Dr. Darrick Penna she prefers to have the aneurysm repaired with a stent graft.  Informed Dr. Darrick Penna, and was advised to send the CD Rom of CTA abdomen and pelvis of 5/22, "overnight express" to the stent rep. Through [employer] for review, to determine if pt. is a candidate for this procedure.  Advised pt. that the films will be reviewed, and she will be notifed of the next step in scheduling the procedure.  Also advised pt. That, per anesthesia, she will need to obtain Cardiac Clearance for the repair of her aneurysm.  Advised her pre-op appt. will be cancelled today, until it is clear which procedure, will be scheduled, and to expect phone call regarding a cardiac appt.   Verb. Understanding.  CD Rom sent to [facility] Rep this AM.

## 2011-07-11 ENCOUNTER — Other Ambulatory Visit: Payer: Self-pay | Admitting: *Deleted

## 2011-07-11 DIAGNOSIS — I714 Abdominal aortic aneurysm, without rupture: Secondary | ICD-10-CM

## 2011-07-11 DIAGNOSIS — Z01818 Encounter for other preprocedural examination: Secondary | ICD-10-CM

## 2011-07-15 ENCOUNTER — Other Ambulatory Visit: Payer: Self-pay | Admitting: Internal Medicine

## 2011-07-15 ENCOUNTER — Ambulatory Visit (INDEPENDENT_AMBULATORY_CARE_PROVIDER_SITE_OTHER): Payer: Medicare Other | Admitting: Nurse Practitioner

## 2011-07-15 ENCOUNTER — Encounter (HOSPITAL_COMMUNITY): Admission: RE | Payer: Self-pay | Source: Ambulatory Visit

## 2011-07-15 ENCOUNTER — Encounter: Payer: Self-pay | Admitting: Nurse Practitioner

## 2011-07-15 ENCOUNTER — Ambulatory Visit: Payer: Medicare Other | Admitting: Nurse Practitioner

## 2011-07-15 ENCOUNTER — Ambulatory Visit: Payer: Medicare Other | Admitting: Cardiology

## 2011-07-15 ENCOUNTER — Encounter: Payer: Self-pay | Admitting: Internal Medicine

## 2011-07-15 VITALS — BP 122/74 | HR 65 | Ht 65.0 in | Wt 162.8 lb

## 2011-07-15 DIAGNOSIS — I429 Cardiomyopathy, unspecified: Secondary | ICD-10-CM

## 2011-07-15 DIAGNOSIS — I10 Essential (primary) hypertension: Secondary | ICD-10-CM

## 2011-07-15 DIAGNOSIS — I2510 Atherosclerotic heart disease of native coronary artery without angina pectoris: Secondary | ICD-10-CM

## 2011-07-15 DIAGNOSIS — I714 Abdominal aortic aneurysm, without rupture, unspecified: Secondary | ICD-10-CM

## 2011-07-15 DIAGNOSIS — I255 Ischemic cardiomyopathy: Secondary | ICD-10-CM

## 2011-07-15 DIAGNOSIS — I2589 Other forms of chronic ischemic heart disease: Secondary | ICD-10-CM

## 2011-07-15 DIAGNOSIS — E785 Hyperlipidemia, unspecified: Secondary | ICD-10-CM

## 2011-07-15 LAB — ICD DEVICE OBSERVATION
BRDY-0002RV: 40 {beats}/min
DEVICE MODEL ICD: 754557
RV LEAD AMPLITUDE: 12 mv
VENTRICULAR PACING ICD: 1 pct

## 2011-07-15 SURGERY — CREATION, BYPASS, ARTERIAL, AORTA TO FEMORAL, BILATERAL, USING GRAFT
Anesthesia: General | Site: Abdomen

## 2011-07-15 MED ORDER — HYDRALAZINE HCL 50 MG PO TABS: 50.0000 mg | ORAL_TABLET | Freq: Three times a day (TID) | ORAL | Status: DC

## 2011-07-15 MED ORDER — ATORVASTATIN CALCIUM 20 MG PO TABS: 20.0000 mg | ORAL_TABLET | Freq: Every day | ORAL | Status: DC

## 2011-07-15 MED ORDER — POTASSIUM CHLORIDE CRYS ER 20 MEQ PO TBCR: 20.0000 meq | EXTENDED_RELEASE_TABLET | Freq: Every day | ORAL | Status: DC

## 2011-07-15 MED ORDER — CARVEDILOL 25 MG PO TABS: 25.0000 mg | ORAL_TABLET | Freq: Two times a day (BID) | ORAL | Status: DC

## 2011-07-15 MED ORDER — FUROSEMIDE 40 MG PO TABS: 40.0000 mg | ORAL_TABLET | Freq: Two times a day (BID) | ORAL | Status: DC

## 2011-07-15 NOTE — Progress Notes (Signed)
Patient Name: Lori York Date of Encounter: 07/15/2011  Primary Care Provider:  Gwynneth Aliment, MD, MD Primary Cardiologist:  J. Allred, MD  Patient Profile  66 year old female with history of CAD status post recent CABG presents for followup and preoperative clearance.  Problem List   Past Medical History  Diagnosis Date  . Long Q-T syndrome     a.  w fx of sudden cardiac arrest;  b. s/p Guidant AICD  . HTN (hypertension)   . Diabetes mellitus   . Glaucoma   . Noncompliance   . CAD (coronary artery disease)     a.  LHC 01/04/11: dLM 20%, pLAD 90%, oD1 80%, RCA 80%, EF 35-40%.;  b. 02/2011 CABG x 3: LIMA->LAD, VG->DIAG, VG->Dist RCA  . Ischemic cardiomyopathy     a. 12/2010 Echo: EF 20-25%, sept/apical AK, Sev Dil LV  . Chronic systolic heart failure   . AAA (abdominal aortic aneurysm)     a. Abd u/s 01/07/11: 5.3x5.5 cm infrarenal fusiform AAA.;  b.06/2011: CTA Abd: 6.2 cm infrarenal AAA  . HLD (hyperlipidemia)   . Asthma   . Sleep apnea     sleep study 2 yrs ago sehv  . Renal artery stenosis     a.  LHC 11/12: prox left RA 80% stenosis  . Seizures     in 20's elevated bp  . GERD (gastroesophageal reflux disease)   . Fibromyalgia   . Anemia   . Peripheral vascular disease   . Pleural effusion, left     a. s/p thoracentesis x 2 04/2011 - total of 2.5L taken off   Past Surgical History  Procedure Date  . Cardiac defibrillator placement     in 1997 with revision to a  Guidant model 1860 single chamber implantable cardioverter- defib on Feb18, 2003, most recent generator change 2/11 by Dr Johney Frame [facility]  . Laminectomy May 2008  . Right knee resconstructuion 1970  . Laminectomy 11/20/07     LF5-S1 fusion  . Back surgery   . Tonsillectomy   . Abdominal hysterectomy 88  . Hemorroidectomy 77  . Coronary artery bypass graft 03/07/2011    Procedure: CORONARY ARTERY BYPASS GRAFTING (CABG);  Surgeon: Purcell Nails, MD;  Location[facility] OR;  Service: Open Heart Surgery;   Laterality: N/A;  Coronary artery bypass graft times three using left internal mammary artery and left leg greater saphenous vein harvested endoscopically  . Insert / replace / remove pacemaker     2003    Allergies  Allergies  Allergen Reactions  . Penicillins Rash    HPI  66 year old female with the above problem list.  She is status post coronary artery bypass grafting x3 in January of this year.  Postoperative course was complicated by mild renal insufficiency and postoperative atrial fibrillation which was short lived.  In March of this year, she underwent a thoracentesis secondary to recurrent left pleural effusion and dyspnea.  She has not been seen in this clinic since prior to her bypass surgery.  She reports that since March she has progressively been getting better.  She does not have dyspnea on exertion when walking up inclines but has recently started walking 1/2 mile a day.  She has had no recurrence of chest pain and denies PND, orthopnea, dizziness, syncope, edema, or early satiety.  She has a large abdominal aortic aneurysm and is pending open repair in the coming weeks.  She has had no abdominal pain.  She presents for preoperative clearance today.  Home Medications  Prior to Admission medications   Medication Sig Start Date End Date Taking? Authorizing Provider  aspirin 325 MG EC tablet Take 325 mg by mouth daily. 03/12/11  Yes Rowe Clack, PA  carvedilol (COREG) 25 MG tablet Take 1 tablet (25 mg total) by mouth 2 (two) times daily. 07/15/11 07/14/12 Yes Ok Anis, NP  furosemide (LASIX) 40 MG tablet Take 1 tablet (40 mg total) by mouth 2 (two) times daily. 07/15/11 07/14/12 Yes Ok Anis, NP  hydrALAZINE (APRESOLINE) 50 MG tablet Take 1 tablet (50 mg total) by mouth 3 (three) times daily. 07/15/11  Yes Ok Anis, NP  PARoxetine (PAXIL) 20 MG tablet Take 20 mg by mouth daily.    Yes Historical Provider, MD  potassium chloride SA (K-DUR,KLOR-CON) 20  MEQ tablet Take 1 tablet (20 mEq total) by mouth daily. 07/15/11  Yes Ok Anis, NP  zolpidem (AMBIEN) 10 MG tablet Take 10 mg by mouth at bedtime as needed. For sleep    Yes Historical Provider, MD  atorvastatin (LIPITOR) 20 MG tablet Take 1 tablet (20 mg total) by mouth daily. 07/15/11   Ok Anis, NP  esomeprazole (NEXIUM) 40 MG capsule Take 40 mg by mouth daily before breakfast.    Historical Provider, MD    Review of Systems She expresses dyspnea exertion with inclines but is otherwise doing well. All other systems reviewed and are otherwise negative except as noted above.  Physical Exam  Blood pressure 122/74, pulse 65, height 5\' 5"  (1.651 m), weight 162 lb 12.8 oz (73.846 kg).  General: Pleasant, NAD Psych: Normal affect. Neuro: Alert and oriented X 3. Moves all extremities spontaneously. HEENT: Normal  Neck: Supple without bruits or JVD. Lungs:  Resp regular and unlabored, CTA. Heart: RRR no s3, s4, or murmurs. Abdomen: Soft, non-tender, non-distended, BS + x 4.  Extremities: No clubbing, cyanosis or edema. DP/PT/Radials 2+ and equal bilaterally.  Accessory Clinical Findings  ECG - regular sinus rhythm, 65, LDH with fairly diffuse T-wave inversion.  She is a prolonged QT.  QTC 582.  Assessment & Plan  1.  Coronary artery disease:  Status post coronary artery bypass grafting x3 in January of this year.  She has had no recurrence of chest pain.  She remains on aspirin and beta blocker therapy.  Her heart rate and blood pressure are well controlled.  She had previous been on Lipitor 20 mg and we will resume this.  2.  Abdominal aortic aneurysm: Patient is pending open repair.  As she has had recent revascularization without recurrence of symptoms, we will not pursue any additional ischemic evaluation however will obtain a 2d. Echocardiogram to reevaluate LV function which was significantly reduced prior to her bypass surgery.  Presuming that LV function is stable  or improved, patient would be felt to be at an acceptable risk for surgery.  Continue beta blocker and statin throughout the perioperative period and then to resume aspirin as soon as felt feasible by surgery.  3.  Ischemic myopathy/chronic systolic congestive heart failure: Last documented EF in November of last year was 20-25%.  As above, obtain repeat a 2-D echocardiogram to reevaluate LV function prior to surgery.  Continue current doses of beta blocker, Lasix, and hydralazine.  Consider initiation of ACE inhibitor postoperatively.  Of note, she did have renal insufficiency following her bypass surgery.  4.  Prolonged QT:  She is status post AICD and this was interrogated today showing a normal device function.  5.  Hypertension: Stable  6.  Hyperlipidemia: Resume statin therapy.  7.  Disposition: To obtain 2-D echocardiogram as above.  Follow and device clinic as scheduled.    Nicolasa Ducking, NP 07/15/2011, 12:59 PM

## 2011-07-15 NOTE — Patient Instructions (Addendum)
Remote monitoring is used to monitor your Pacemaker of ICD from home. This monitoring reduces the number of office visits required to check your device to one time per year. It allows [place] to keep an eye on the functioning of your device to ensure it is working properly. You are scheduled for a device check from home on October 17, 2011. You may send your transmission at any time that day. If you have a wireless device, the transmission will be sent automatically. After your physician reviews your transmission, you will receive a postcard with your next transmission date.  Your physician wants you to follow-up in: 1 year with Dr Johney Frame.  You will receive a reminder letter in the mail two months in advance. If you don't receive a letter, please call our office to schedule the follow-up appointment.  Your physician has requested that you have an echocardiogram. Echocardiography is a painless test that uses sound waves to create images of your heart. It provides your doctor with information about the size and shape of your heart and how well your heart's chambers and valves are working. This procedure takes approximately one hour. There are no restrictions for this procedure.  Your physician has recommended you make the following change in your medication: RESTART Lipitor 20 mg daily

## 2011-07-17 ENCOUNTER — Ambulatory Visit (HOSPITAL_COMMUNITY): Payer: Medicare Other | Attending: Nurse Practitioner | Admitting: Radiology

## 2011-07-17 ENCOUNTER — Ambulatory Visit (HOSPITAL_COMMUNITY): Admission: RE | Admit: 2011-07-17 | Payer: Medicare Other | Source: Ambulatory Visit | Admitting: Vascular Surgery

## 2011-07-17 DIAGNOSIS — E785 Hyperlipidemia, unspecified: Secondary | ICD-10-CM | POA: Insufficient documentation

## 2011-07-17 DIAGNOSIS — I714 Abdominal aortic aneurysm, without rupture, unspecified: Secondary | ICD-10-CM | POA: Insufficient documentation

## 2011-07-17 DIAGNOSIS — I2589 Other forms of chronic ischemic heart disease: Secondary | ICD-10-CM | POA: Insufficient documentation

## 2011-07-17 DIAGNOSIS — I429 Cardiomyopathy, unspecified: Secondary | ICD-10-CM

## 2011-07-17 DIAGNOSIS — I5022 Chronic systolic (congestive) heart failure: Secondary | ICD-10-CM | POA: Insufficient documentation

## 2011-07-17 DIAGNOSIS — I517 Cardiomegaly: Secondary | ICD-10-CM | POA: Insufficient documentation

## 2011-07-17 DIAGNOSIS — I10 Essential (primary) hypertension: Secondary | ICD-10-CM | POA: Insufficient documentation

## 2011-07-17 NOTE — Progress Notes (Signed)
Echocardiogram performed.  

## 2011-07-24 ENCOUNTER — Other Ambulatory Visit: Payer: Self-pay | Admitting: *Deleted

## 2011-07-25 ENCOUNTER — Encounter (HOSPITAL_COMMUNITY)
Admission: RE | Admit: 2011-07-25 | Discharge: 2011-07-25 | Disposition: A | Discharge status: Home / Self Care | Payer: Medicare Other | Source: Ambulatory Visit | Attending: Vascular Surgery | Admitting: Vascular Surgery

## 2011-07-25 ENCOUNTER — Encounter (HOSPITAL_COMMUNITY): Payer: Self-pay

## 2011-07-25 ENCOUNTER — Encounter (HOSPITAL_COMMUNITY): Payer: Self-pay | Admitting: Pharmacy Technician

## 2011-07-25 ENCOUNTER — Encounter (HOSPITAL_COMMUNITY)
Admission: RE | Admit: 2011-07-25 | Discharge: 2011-07-25 | Disposition: A | Discharge status: Home / Self Care | Payer: Medicare Other | Source: Ambulatory Visit | Attending: Anesthesiology | Admitting: Anesthesiology

## 2011-07-25 LAB — BLOOD GAS, ARTERIAL
Acid-Base Excess: 5.2 mmol/L — ABNORMAL HIGH (ref 0.0–2.0)
Bicarbonate: 28.8 mEq/L — ABNORMAL HIGH (ref 20.0–24.0)
TCO2: 30 mmol/L (ref 0–100)
pCO2 arterial: 39.7 mmHg (ref 35.0–45.0)
pH, Arterial: 7.474 — ABNORMAL HIGH (ref 7.350–7.400)
pO2, Arterial: 103 mmHg — ABNORMAL HIGH (ref 80.0–100.0)

## 2011-07-25 LAB — COMPREHENSIVE METABOLIC PANEL
AST: 10 U/L (ref 0–37)
Albumin: 3.8 g/dL (ref 3.5–5.2)
Alkaline Phosphatase: 89 U/L (ref 39–117)
BUN: 30 mg/dL — ABNORMAL HIGH (ref 6–23)
CO2: 26 mEq/L (ref 19–32)
Chloride: 102 mEq/L (ref 96–112)
Creatinine, Ser: 1.6 mg/dL — ABNORMAL HIGH (ref 0.50–1.10)
GFR calc non Af Amer: 33 mL/min — ABNORMAL LOW (ref >90)
Potassium: 3.5 mEq/L (ref 3.5–5.1)
Total Bilirubin: 0.3 mg/dL (ref 0.3–1.2)

## 2011-07-25 LAB — URINALYSIS, ROUTINE W REFLEX MICROSCOPIC
Glucose, UA: NEGATIVE mg/dL
Hgb urine dipstick: NEGATIVE
Leukocytes, UA: NEGATIVE
Specific Gravity, Urine: 1.02 (ref 1.005–1.030)
pH: 5 (ref 5.0–8.0)

## 2011-07-25 LAB — CBC
HCT: 37.3 % (ref 36.0–46.0)
MCV: 86.3 fL (ref 78.0–100.0)
RBC: 4.32 MIL/uL (ref 3.87–5.11)
RDW: 17.9 % — ABNORMAL HIGH (ref 11.5–15.5)
WBC: 5.7 10*3/uL (ref 4.0–10.5)

## 2011-07-25 NOTE — Progress Notes (Signed)
Orders for perioperative implanted device faxed to [facility] device clinic. Pt ICD is from [facility] the Rep is [place] [phone]. Received fax confirmation. Pt stated a history of sleep apnea. Stated a sleep study done years ago at [facility]. Sleep center stated any study done over 10 years was sent to the warehouse, and would not be here for Monday Surgery. Called [facility] medical records and they don't have one on file. Pt do not use a CPAP for sleeping.

## 2011-07-25 NOTE — Pre-Procedure Instructions (Signed)
20 Lori York  07/25/2011   Your procedure is scheduled on:  July 29, 2011 Monday at 0935 AM  Report to [facility] at [phone].  Call this number if you have problems the morning of surgery: [phone]   Remember:   Do not eat food or drink:After Midnight. Sunday      Take these medicines the morning of surgery with A SIP OF WATER: Coreg, Nexium ,Apresoline and Paxil   Do not wear jewelry, make-up or nail polish.  Do not wear lotions, powders, or perfumes. You may wear deodorant.  Do not shave 48 hours prior to surgery. Men may shave face and neck.  Do not bring valuables to the hospital.  Contacts, dentures or bridgework may not be worn into surgery.  Leave suitcase in the car. After surgery it may be brought to your room.  For patients admitted to the hospital, checkout time is 11:00 AM the day of discharge.   Patients discharged the day of surgery will not be allowed to drive home.    Special Instructions: CHG Shower Use Special Wash: 1/2 bottle night before surgery and 1/2 bottle morning of surgery.   Please read over the following fact sheets that you were given: Pain Booklet, Coughing and Deep Breathing, Blood Transfusion Information, MRSA Information and Surgical Site Infection Prevention

## 2011-07-26 NOTE — Progress Notes (Signed)
Spoke with Gunnar Fusi in [employer], reiterated that the order for device needs attention (signing), for pt.'s surgery- 07/29/2011.

## 2011-07-26 NOTE — Progress Notes (Signed)
Patient updated of new arrival time of 05:30

## 2011-07-26 NOTE — Progress Notes (Signed)
Left message at [facility] to fill out perioperative form and fax back to short stay A.  Also notified [facility] Rep., of pt's surgery date and time.

## 2011-07-28 MED ORDER — VANCOMYCIN HCL IN DEXTROSE 1-5 GM/200ML-% IV SOLN
1000.0000 mg | INTRAVENOUS | Status: AC
  Administered 2011-07-29: 1000 mg via INTRAVENOUS
  Filled 2011-07-28: qty 200

## 2011-07-29 ENCOUNTER — Encounter (HOSPITAL_COMMUNITY): Payer: Self-pay | Admitting: Certified Registered Nurse Anesthetist

## 2011-07-29 ENCOUNTER — Inpatient Hospital Stay (HOSPITAL_COMMUNITY): Payer: Medicare Other

## 2011-07-29 ENCOUNTER — Encounter (HOSPITAL_COMMUNITY)
Admission: RE | Disposition: A | Discharge status: Home / Self Care | Payer: Self-pay | Source: Ambulatory Visit | Attending: Vascular Surgery

## 2011-07-29 ENCOUNTER — Encounter (HOSPITAL_COMMUNITY): Payer: Self-pay | Admitting: *Deleted

## 2011-07-29 ENCOUNTER — Ambulatory Visit (HOSPITAL_COMMUNITY): Payer: Medicare Other | Admitting: Certified Registered Nurse Anesthetist

## 2011-07-29 ENCOUNTER — Inpatient Hospital Stay (HOSPITAL_COMMUNITY)
Admission: RE | Admit: 2011-07-29 | Discharge: 2011-08-05 | DRG: 237 | Disposition: A | Discharge status: Home / Self Care | Payer: Medicare Other | Source: Ambulatory Visit | Attending: Vascular Surgery | Admitting: Vascular Surgery

## 2011-07-29 DIAGNOSIS — I5022 Chronic systolic (congestive) heart failure: Secondary | ICD-10-CM | POA: Diagnosis present

## 2011-07-29 DIAGNOSIS — I2510 Atherosclerotic heart disease of native coronary artery without angina pectoris: Secondary | ICD-10-CM | POA: Diagnosis present

## 2011-07-29 DIAGNOSIS — I714 Abdominal aortic aneurysm, without rupture, unspecified: Principal | ICD-10-CM | POA: Diagnosis present

## 2011-07-29 DIAGNOSIS — Z7982 Long term (current) use of aspirin: Secondary | ICD-10-CM

## 2011-07-29 DIAGNOSIS — IMO0001 Reserved for inherently not codable concepts without codable children: Secondary | ICD-10-CM | POA: Diagnosis present

## 2011-07-29 DIAGNOSIS — E119 Type 2 diabetes mellitus without complications: Secondary | ICD-10-CM | POA: Diagnosis present

## 2011-07-29 DIAGNOSIS — K560 Paralytic ileus: Secondary | ICD-10-CM | POA: Diagnosis present

## 2011-07-29 DIAGNOSIS — N2589 Other disorders resulting from impaired renal tubular function: Secondary | ICD-10-CM | POA: Diagnosis present

## 2011-07-29 DIAGNOSIS — I739 Peripheral vascular disease, unspecified: Secondary | ICD-10-CM | POA: Diagnosis present

## 2011-07-29 DIAGNOSIS — E785 Hyperlipidemia, unspecified: Secondary | ICD-10-CM | POA: Diagnosis present

## 2011-07-29 DIAGNOSIS — Z95810 Presence of automatic (implantable) cardiac defibrillator: Secondary | ICD-10-CM

## 2011-07-29 DIAGNOSIS — Z01812 Encounter for preprocedural laboratory examination: Secondary | ICD-10-CM

## 2011-07-29 DIAGNOSIS — D62 Acute posthemorrhagic anemia: Secondary | ICD-10-CM | POA: Diagnosis not present

## 2011-07-29 DIAGNOSIS — Z87891 Personal history of nicotine dependence: Secondary | ICD-10-CM

## 2011-07-29 DIAGNOSIS — Z951 Presence of aortocoronary bypass graft: Secondary | ICD-10-CM

## 2011-07-29 DIAGNOSIS — N170 Acute kidney failure with tubular necrosis: Secondary | ICD-10-CM | POA: Diagnosis not present

## 2011-07-29 DIAGNOSIS — I2589 Other forms of chronic ischemic heart disease: Secondary | ICD-10-CM | POA: Diagnosis present

## 2011-07-29 HISTORY — PX: ABDOMINAL AORTIC ANEURYSM REPAIR: SHX42

## 2011-07-29 LAB — POCT I-STAT 7, (LYTES, BLD GAS, ICA,H+H)
Bicarbonate: 27 mEq/L — ABNORMAL HIGH (ref 20.0–24.0)
Calcium, Ion: 1.23 mmol/L (ref 1.12–1.32)
HCT: 24 % — ABNORMAL LOW (ref 36.0–46.0)
Hemoglobin: 10.9 g/dL — ABNORMAL LOW (ref 12.0–15.0)
Hemoglobin: 8.2 g/dL — ABNORMAL LOW (ref 12.0–15.0)
Patient temperature: 34.5
Patient temperature: 34
Potassium: 2.9 mEq/L — ABNORMAL LOW (ref 3.5–5.1)
Sodium: 138 mEq/L (ref 135–145)
Sodium: 138 mEq/L (ref 135–145)
TCO2: 28 mmol/L (ref 0–100)
TCO2: 28 mmol/L (ref 0–100)
pCO2 arterial: 37.4 mmHg (ref 35.0–45.0)
pH, Arterial: 7.428 — ABNORMAL HIGH (ref 7.350–7.400)
pH, Arterial: 7.447 — ABNORMAL HIGH (ref 7.350–7.400)
pO2, Arterial: 284 mmHg — ABNORMAL HIGH (ref 80.0–100.0)

## 2011-07-29 LAB — POCT I-STAT, CHEM 8
Creatinine, Ser: 1.4 mg/dL — ABNORMAL HIGH (ref 0.50–1.10)
Hemoglobin: 15 g/dL (ref 12.0–15.0)
Potassium: 4.1 mEq/L (ref 3.5–5.1)
Sodium: 140 mEq/L (ref 135–145)
TCO2: 24 mmol/L (ref 0–100)

## 2011-07-29 LAB — BASIC METABOLIC PANEL
CO2: 24 mEq/L (ref 19–32)
Calcium: 8.5 mg/dL (ref 8.4–10.5)
Chloride: 105 mEq/L (ref 96–112)
Creatinine, Ser: 1.48 mg/dL — ABNORMAL HIGH (ref 0.50–1.10)
Glucose, Bld: 154 mg/dL — ABNORMAL HIGH (ref 70–99)
Sodium: 139 mEq/L (ref 135–145)

## 2011-07-29 LAB — POCT I-STAT 3, ART BLOOD GAS (G3+)
Acid-base deficit: 2 mmol/L (ref 0.0–2.0)
Bicarbonate: 24 mEq/L (ref 20.0–24.0)
O2 Saturation: 97 %
O2 Saturation: 99 %
O2 Saturation: 97 %
Patient temperature: 36.9
Patient temperature: 36.8
TCO2: 27 mmol/L (ref 0–100)
pCO2 arterial: 46.2 mmHg — ABNORMAL HIGH (ref 35.0–45.0)
pCO2 arterial: 54.1 mmHg — ABNORMAL HIGH (ref 35.0–45.0)
pH, Arterial: 7.3 — ABNORMAL LOW (ref 7.350–7.400)
pO2, Arterial: 89 mmHg (ref 80.0–100.0)
pO2, Arterial: 153 mmHg — ABNORMAL HIGH (ref 80.0–100.0)
pO2, Arterial: 106 mmHg — ABNORMAL HIGH (ref 80.0–100.0)

## 2011-07-29 LAB — POCT I-STAT 4, (NA,K, GLUC, HGB,HCT): Hemoglobin: 13.3 g/dL (ref 12.0–15.0)

## 2011-07-29 LAB — MAGNESIUM: Magnesium: 1.7 mg/dL (ref 1.5–2.5)

## 2011-07-29 LAB — PROTIME-INR
INR: 1.41 (ref 0.00–1.49)
Prothrombin Time: 17.5 seconds — ABNORMAL HIGH (ref 11.6–15.2)

## 2011-07-29 LAB — CBC
Hemoglobin: 14 g/dL (ref 12.0–15.0)
MCH: 29.2 pg (ref 26.0–34.0)
MCV: 87.1 fL (ref 78.0–100.0)
Platelets: 152 10*3/uL (ref 150–400)
RBC: 4.79 MIL/uL (ref 3.87–5.11)
WBC: 15.8 10*3/uL — ABNORMAL HIGH (ref 4.0–10.5)

## 2011-07-29 LAB — GLUCOSE, CAPILLARY: Glucose-Capillary: 123 mg/dL — ABNORMAL HIGH (ref 70–99)

## 2011-07-29 SURGERY — ANEURYSM ABDOMINAL AORTIC REPAIR
Anesthesia: General | Site: Abdomen | Wound class: Clean

## 2011-07-29 MED ORDER — ALBUMIN HUMAN 5 % IV SOLN
INTRAVENOUS | Status: DC | PRN
  Administered 2011-07-29 (×2): via INTRAVENOUS

## 2011-07-29 MED ORDER — PANTOPRAZOLE SODIUM 40 MG IV SOLR
40.0000 mg | INTRAVENOUS | Status: DC
  Administered 2011-07-29 – 2011-08-03 (×6): 40 mg via INTRAVENOUS
  Filled 2011-07-29 (×8): qty 40

## 2011-07-29 MED ORDER — ONDANSETRON HCL 4 MG/2ML IJ SOLN
4.0000 mg | Freq: Four times a day (QID) | INTRAMUSCULAR | Status: AC | PRN
  Administered 2011-08-03: 4 mg via INTRAVENOUS
  Filled 2011-07-29: qty 2

## 2011-07-29 MED ORDER — LACTATED RINGERS IV SOLN
INTRAVENOUS | Status: DC | PRN
  Administered 2011-07-29 (×2): via INTRAVENOUS

## 2011-07-29 MED ORDER — HEPARIN SODIUM (PORCINE) 1000 UNIT/ML IJ SOLN
INTRAMUSCULAR | Status: DC | PRN
  Administered 2011-07-29: 8000 [IU] via INTRAVENOUS
  Administered 2011-07-29: 3000 [IU] via INTRAVENOUS

## 2011-07-29 MED ORDER — OXYCODONE HCL 5 MG PO TABS
5.0000 mg | ORAL_TABLET | ORAL | Status: DC | PRN
  Administered 2011-08-04 – 2011-08-05 (×2): 10 mg via ORAL
  Filled 2011-07-29 (×3): qty 2

## 2011-07-29 MED ORDER — ROCURONIUM BROMIDE 100 MG/10ML IV SOLN
INTRAVENOUS | Status: DC | PRN
  Administered 2011-07-29: 50 mg via INTRAVENOUS

## 2011-07-29 MED ORDER — PROPOFOL 10 MG/ML IV EMUL
INTRAVENOUS | Status: DC | PRN
  Administered 2011-07-29: 150 mg via INTRAVENOUS

## 2011-07-29 MED ORDER — LACTATED RINGERS IV SOLN
INTRAVENOUS | Status: DC | PRN
  Administered 2011-07-29 (×2): via INTRAVENOUS

## 2011-07-29 MED ORDER — ONDANSETRON HCL 4 MG/2ML IJ SOLN
INTRAMUSCULAR | Status: DC | PRN
  Administered 2011-07-29: 4 mg via INTRAVENOUS

## 2011-07-29 MED ORDER — SODIUM CHLORIDE 0.9 % IV SOLN: 500.0000 mL | Freq: Once | INTRAVENOUS | Status: AC | PRN

## 2011-07-29 MED ORDER — PHENYLEPHRINE HCL 10 MG/ML IJ SOLN
INTRAMUSCULAR | Status: DC | PRN
  Administered 2011-07-29: 80 ug via INTRAVENOUS

## 2011-07-29 MED ORDER — CARVEDILOL 12.5 MG PO TABS
ORAL_TABLET | ORAL | Status: AC
  Administered 2011-07-29: 25 mg
  Filled 2011-07-29: qty 2

## 2011-07-29 MED ORDER — SODIUM CHLORIDE 0.9 % IV SOLN
INTRAVENOUS | Status: DC | PRN
  Administered 2011-07-29: 11:00:00 via INTRAVENOUS

## 2011-07-29 MED ORDER — 0.9 % SODIUM CHLORIDE (POUR BTL) OPTIME
TOPICAL | Status: DC | PRN
  Administered 2011-07-29 (×3): 1000 mL

## 2011-07-29 MED ORDER — SODIUM CHLORIDE 0.9 % IV SOLN: INTRAVENOUS | Status: DC

## 2011-07-29 MED ORDER — INSULIN ASPART 100 UNIT/ML ~~LOC~~ SOLN
0.0000 [IU] | SUBCUTANEOUS | Status: DC
  Administered 2011-07-29: 3 [IU] via SUBCUTANEOUS
  Administered 2011-07-29 – 2011-08-03 (×17): 2 [IU] via SUBCUTANEOUS

## 2011-07-29 MED ORDER — NITROGLYCERIN IN D5W 200-5 MCG/ML-% IV SOLN
INTRAVENOUS | Status: DC | PRN
  Administered 2011-07-29: 10 ug/min via INTRAVENOUS

## 2011-07-29 MED ORDER — THROMBIN 20000 UNITS EX KIT
PACK | OROMUCOSAL | Status: DC | PRN
  Administered 2011-07-29: 12:00:00 via TOPICAL

## 2011-07-29 MED ORDER — LABETALOL HCL 5 MG/ML IV SOLN
10.0000 mg | INTRAVENOUS | Status: DC | PRN
  Administered 2011-07-31 – 2011-08-05 (×23): 10 mg via INTRAVENOUS
  Filled 2011-07-29 (×19): qty 4

## 2011-07-29 MED ORDER — FUROSEMIDE 10 MG/ML IJ SOLN
INTRAMUSCULAR | Status: DC | PRN
  Administered 2011-07-29: 80 mg via INTRAMUSCULAR

## 2011-07-29 MED ORDER — ONDANSETRON HCL 4 MG/2ML IJ SOLN
4.0000 mg | Freq: Four times a day (QID) | INTRAMUSCULAR | Status: DC | PRN
  Administered 2011-08-04 (×2): 4 mg via INTRAVENOUS
  Filled 2011-07-29 (×2): qty 2

## 2011-07-29 MED ORDER — DOCUSATE SODIUM 100 MG PO CAPS
100.0000 mg | ORAL_CAPSULE | Freq: Every day | ORAL | Status: DC
  Administered 2011-08-03 – 2011-08-05 (×3): 100 mg via ORAL
  Filled 2011-07-29 (×3): qty 1

## 2011-07-29 MED ORDER — GUAIFENESIN-DM 100-10 MG/5ML PO SYRP: 15.0000 mL | ORAL_SOLUTION | ORAL | Status: DC | PRN

## 2011-07-29 MED ORDER — POTASSIUM CHLORIDE CRYS ER 20 MEQ PO TBCR: 20.0000 meq | EXTENDED_RELEASE_TABLET | Freq: Once | ORAL | Status: AC | PRN

## 2011-07-29 MED ORDER — ACETAMINOPHEN 650 MG RE SUPP: 325.0000 mg | RECTAL | Status: DC | PRN

## 2011-07-29 MED ORDER — EPHEDRINE SULFATE 50 MG/ML IJ SOLN
INTRAMUSCULAR | Status: DC | PRN
  Administered 2011-07-29: 10 mg via INTRAVENOUS

## 2011-07-29 MED ORDER — SODIUM CHLORIDE 0.9 % IR SOLN
Status: DC | PRN
  Administered 2011-07-29: 09:00:00

## 2011-07-29 MED ORDER — KCL IN DEXTROSE-NACL 20-5-0.45 MEQ/L-%-% IV SOLN
INTRAVENOUS | Status: DC
  Administered 2011-07-29: 17:00:00 via INTRAVENOUS
  Administered 2011-07-30: 75 mL/h via INTRAVENOUS
  Administered 2011-07-30 – 2011-08-04 (×9): via INTRAVENOUS
  Filled 2011-07-29 (×18): qty 1000

## 2011-07-29 MED ORDER — PHENYLEPHRINE HCL 10 MG/ML IJ SOLN
10.0000 mg | INTRAVENOUS | Status: DC | PRN
  Administered 2011-07-29: 25 ug/min via INTRAVENOUS

## 2011-07-29 MED ORDER — METOPROLOL TARTRATE 1 MG/ML IV SOLN
2.0000 mg | INTRAVENOUS | Status: AC | PRN
  Administered 2011-08-01 – 2011-08-02 (×2): 5 mg via INTRAVENOUS
  Filled 2011-07-29: qty 5

## 2011-07-29 MED ORDER — LACTATED RINGERS IV SOLN
INTRAVENOUS | Status: DC | PRN
  Administered 2011-07-29: 07:00:00 via INTRAVENOUS

## 2011-07-29 MED ORDER — DOPAMINE-DEXTROSE 3.2-5 MG/ML-% IV SOLN: 3.0000 ug/kg/min | INTRAVENOUS | Status: DC

## 2011-07-29 MED ORDER — HYDRALAZINE HCL 20 MG/ML IJ SOLN
10.0000 mg | INTRAMUSCULAR | Status: DC | PRN
  Administered 2011-07-29 – 2011-08-04 (×16): 10 mg via INTRAVENOUS
  Filled 2011-07-29 (×11): qty 0.5

## 2011-07-29 MED ORDER — SODIUM CHLORIDE 0.9 % IV SOLN
INTRAVENOUS | Status: DC | PRN
  Administered 2011-07-29: 12:00:00 via INTRAVENOUS

## 2011-07-29 MED ORDER — ALUM & MAG HYDROXIDE-SIMETH 200-200-20 MG/5ML PO SUSP: 15.0000 mL | ORAL | Status: DC | PRN

## 2011-07-29 MED ORDER — PANTOPRAZOLE SODIUM 40 MG PO TBEC: 40.0000 mg | DELAYED_RELEASE_TABLET | Freq: Every day | ORAL | Status: DC

## 2011-07-29 MED ORDER — MORPHINE SULFATE 2 MG/ML IJ SOLN
2.0000 mg | INTRAMUSCULAR | Status: DC | PRN
  Administered 2011-07-29 (×4): 2 mg via INTRAVENOUS
  Administered 2011-07-29 – 2011-07-30 (×3): 4 mg via INTRAVENOUS
  Administered 2011-07-30 (×5): 2 mg via INTRAVENOUS
  Administered 2011-07-30: 4 mg via INTRAVENOUS
  Administered 2011-07-31 (×3): 2 mg via INTRAVENOUS
  Administered 2011-07-31 (×2): 4 mg via INTRAVENOUS
  Administered 2011-08-01: 2 mg via INTRAVENOUS
  Administered 2011-08-03 (×2): 4 mg via INTRAVENOUS
  Administered 2011-08-03: 2 mg via INTRAVENOUS
  Filled 2011-07-29: qty 1
  Filled 2011-07-29: qty 2
  Filled 2011-07-29 (×2): qty 1
  Filled 2011-07-29: qty 2
  Filled 2011-07-29 (×4): qty 1
  Filled 2011-07-29 (×2): qty 2
  Filled 2011-07-29: qty 1
  Filled 2011-07-29 (×2): qty 2
  Filled 2011-07-29 (×2): qty 1
  Filled 2011-07-29 (×2): qty 2
  Filled 2011-07-29: qty 1
  Filled 2011-07-29: qty 2
  Filled 2011-07-29: qty 1

## 2011-07-29 MED ORDER — PHENOL 1.4 % MT LIQD: 1.0000 | OROMUCOSAL | Status: DC | PRN

## 2011-07-29 MED ORDER — PROTAMINE SULFATE 10 MG/ML IV SOLN
INTRAVENOUS | Status: DC | PRN
  Administered 2011-07-29: 90 mg via INTRAVENOUS
  Administered 2011-07-29: 10 mg via INTRAVENOUS

## 2011-07-29 MED ORDER — VECURONIUM BROMIDE 10 MG IV SOLR
INTRAVENOUS | Status: DC | PRN
  Administered 2011-07-29: 3 mg via INTRAVENOUS

## 2011-07-29 MED ORDER — TEMAZEPAM 15 MG PO CAPS: 15.0000 mg | ORAL_CAPSULE | Freq: Every evening | ORAL | Status: DC | PRN

## 2011-07-29 MED ORDER — ACETAMINOPHEN 325 MG PO TABS: 325.0000 mg | ORAL_TABLET | ORAL | Status: DC | PRN

## 2011-07-29 MED ORDER — FENTANYL CITRATE 0.05 MG/ML IJ SOLN
INTRAMUSCULAR | Status: DC | PRN
  Administered 2011-07-29: 100 ug via INTRAVENOUS
  Administered 2011-07-29 (×2): 50 ug via INTRAVENOUS
  Administered 2011-07-29: 100 ug via INTRAVENOUS
  Administered 2011-07-29: 150 ug via INTRAVENOUS
  Administered 2011-07-29: 50 ug via INTRAVENOUS
  Administered 2011-07-29: 150 ug via INTRAVENOUS
  Administered 2011-07-29 (×5): 50 ug via INTRAVENOUS
  Administered 2011-07-29: 100 ug via INTRAVENOUS

## 2011-07-29 MED ORDER — NITROPRUSSIDE SODIUM 25 MG/ML IV SOLN
0.2500 ug/kg/min | INTRAVENOUS | Status: DC
  Administered 2011-07-29 – 2011-08-01 (×2): 0.3 ug/kg/min via INTRAVENOUS
  Administered 2011-08-02 – 2011-08-03 (×2): 0.5 ug/kg/min via INTRAVENOUS
  Filled 2011-07-29 (×9): qty 2

## 2011-07-29 MED ORDER — VANCOMYCIN HCL IN DEXTROSE 1-5 GM/200ML-% IV SOLN
1000.0000 mg | Freq: Two times a day (BID) | INTRAVENOUS | Status: AC
  Administered 2011-07-29 – 2011-07-30 (×2): 1000 mg via INTRAVENOUS
  Filled 2011-07-29 (×2): qty 200

## 2011-07-29 MED ORDER — MIDAZOLAM HCL 5 MG/5ML IJ SOLN
INTRAMUSCULAR | Status: DC | PRN
  Administered 2011-07-29 (×2): 2 mg via INTRAVENOUS

## 2011-07-29 MED ORDER — HYDROMORPHONE HCL PF 1 MG/ML IJ SOLN: 0.2500 mg | INTRAMUSCULAR | Status: DC | PRN

## 2011-07-29 SURGICAL SUPPLY — 48 items
ATTRACTOMAT 16X20 MAGNETIC DRP (DRAPES) ×2 IMPLANT
CANISTER SUCTION 2500CC (MISCELLANEOUS) ×2 IMPLANT
CANNULA VESSEL W/WING WO/VALVE (CANNULA) ×2 IMPLANT
CLIP TI MEDIUM 24 (CLIP) ×2 IMPLANT
CLIP TI WIDE RED SMALL 24 (CLIP) ×2 IMPLANT
CLOTH BEACON ORANGE TIMEOUT ST (SAFETY) ×2 IMPLANT
COVER SURGICAL LIGHT HANDLE (MISCELLANEOUS) ×4 IMPLANT
DRAPE WARM FLUID 44X44 (DRAPE) ×2 IMPLANT
DRSG COVADERM 4X14 (GAUZE/BANDAGES/DRESSINGS) ×2 IMPLANT
ELECT BLADE 6.5 EXT (BLADE) ×2 IMPLANT
ELECT REM PT RETURN 9FT ADLT (ELECTROSURGICAL) ×2
ELECTRODE REM PT RTRN 9FT ADLT (ELECTROSURGICAL) ×1 IMPLANT
GLOVE BIO SURGEON STRL SZ 6.5 (GLOVE) ×6 IMPLANT
GLOVE BIO SURGEON STRL SZ7.5 (GLOVE) ×4 IMPLANT
GLOVE BIOGEL PI IND STRL 6.5 (GLOVE) ×3 IMPLANT
GLOVE BIOGEL PI INDICATOR 6.5 (GLOVE) ×3
GLOVE SURG SS PI 6.0 STRL IVOR (GLOVE) ×2 IMPLANT
GLOVE SURG SS PI 7.5 STRL IVOR (GLOVE) ×2 IMPLANT
GOWN PREVENTION PLUS XLARGE (GOWN DISPOSABLE) ×2 IMPLANT
GOWN STRL NON-REIN LRG LVL3 (GOWN DISPOSABLE) ×12 IMPLANT
GRAFT HEMASHIELD 18X30M (Vascular Products) ×2 IMPLANT
INSERT FOGARTY 61MM (MISCELLANEOUS) ×2 IMPLANT
INSERT FOGARTY SM (MISCELLANEOUS) ×4 IMPLANT
KIT BASIN OR (CUSTOM PROCEDURE TRAY) ×2 IMPLANT
KIT ROOM TURNOVER OR (KITS) ×2 IMPLANT
LOOP VESSEL MINI RED (MISCELLANEOUS) IMPLANT
NS IRRIG 1000ML POUR BTL (IV SOLUTION) ×4 IMPLANT
PACK AORTA (CUSTOM PROCEDURE TRAY) ×2 IMPLANT
PAD ARMBOARD 7.5X6 YLW CONV (MISCELLANEOUS) ×4 IMPLANT
RETAINER VISCERA MED (MISCELLANEOUS) ×2 IMPLANT
SPECIMEN JAR MEDIUM (MISCELLANEOUS) ×2 IMPLANT
SPONGE LAP 18X18 X RAY DECT (DISPOSABLE) ×2 IMPLANT
SPONGE SURGIFOAM ABS GEL 100 (HEMOSTASIS) IMPLANT
STAPLER VISISTAT 35W (STAPLE) ×4 IMPLANT
SUT PDS AB 1 TP1 54 (SUTURE) ×6 IMPLANT
SUT PROLENE 3 0 SH DA (SUTURE) ×12 IMPLANT
SUT PROLENE 3 0 SH1 36 (SUTURE) ×4 IMPLANT
SUT PROLENE 5 0 C 1 36 (SUTURE) ×4 IMPLANT
SUT PROLENE 5 0 CC 1 (SUTURE) ×4 IMPLANT
SUT PROLENE 6 0 C 1 30 (SUTURE) ×2 IMPLANT
SUT SILK 2 0SH CR/8 30 (SUTURE) ×2 IMPLANT
SUT VIC AB 2-0 CT1 36 (SUTURE) IMPLANT
SUT VIC AB 3-0 SH 27 (SUTURE) ×4
SUT VIC AB 3-0 SH 27X BRD (SUTURE) ×4 IMPLANT
TOWEL OR 17X24 6PK STRL BLUE (TOWEL DISPOSABLE) ×4 IMPLANT
TOWEL OR 17X26 10 PK STRL BLUE (TOWEL DISPOSABLE) ×4 IMPLANT
TRAY FOLEY CATH 14FRSI W/METER (CATHETERS) ×2 IMPLANT
WATER STERILE IRR 1000ML POUR (IV SOLUTION) ×4 IMPLANT

## 2011-07-29 NOTE — H&P (View-Only) (Signed)
[facility] [place] HISTORY AND PHYSICAL   History of Present Illness:  Patient is a 65 y.o. year old female who presents for evaluation of abdominal aortic aneurysm she was last seen in December of 2012 at which time her aneurysm was 6 cm in diameter. However, she also had three-vessel coronary disease and recently underwent coronary bypass grafting by Dr. Owen. She also has a previous history of an AICD. She continues to deny any abdominal or back pain. CT angiogram repeated yesterday which shows aneurysm was 6.2 cm in diameter. There is some thrombus in the neck. There is tortuosity of the left iliac system. There is no evidence of rupture.  Other medical problems include , coronary disease, ischemic cardiomyopathy, hyperlipidemia, asthma. She states that she has quit smoking. All of her medical problems are currently controlled.  Past Medical History  Diagnosis Date  . 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 HTN (hypertension)   . Diabetes mellitus   . Glaucoma   . Noncompliance   . CAD (coronary artery disease)     [facility] 01/04/11: dLM 20%, pLAD 90%, oD1 80%, RCA 80%, EF 35-40%.  . Ischemic cardiomyopathy   . Chronic systolic heart failure   . AAA (abdominal aortic aneurysm)     u/s 01/07/11: 5.3x5.5 cm infrarenal fusiform AAA.  . HLD (hyperlipidemia)   . Chest pain 01/28/11  . SOB (shortness of breath) 01/28/11  . Angina   . Asthma   . Sleep apnea     sleep study 2 yrs ago sehv  . Renal artery stenosis     at [facility] 11/12: prox left RA 80% stenosis  . Seizures     in 20's elevated bp  . GERD (gastroesophageal reflux disease)   . Fibromyalgia   . Anemia   . Peripheral vascular disease     Past Surgical History  Procedure Date  . Cardiac defibrillator placement     in 1997 with revision to a  Guidant model 1860 single chamber implantable cardioverter- defib on Feb18, 2003, most recent generator change 2/11 by Dr Allred SJM  . Laminectomy May  2008  . Right knee resconstructuion 1970  . Laminectomy 11/20/07     LF5-S1 fusion  . Back surgery   . Tonsillectomy   . Abdominal hysterectomy 88  . Hemorroidectomy 77  . Coronary artery bypass graft 03/07/2011    Procedure: CORONARY ARTERY BYPASS GRAFTING (CABG);  Surgeon: Clarence H Owen, MD;  Location: [facility] OR;  Service: Open Heart Surgery;  Laterality: N/A;  Coronary artery bypass graft times three using left internal mammary artery and left leg greater saphenous vein harvested endoscopically     Social History History  Substance Use Topics  . Smoking status: Former Smoker -- 0.3 packs/day    Types: Cigarettes    Quit date: 01/12/2011  . Smokeless tobacco: Never Used   Comment:    . Alcohol Use: No    Family History Family History  Problem Relation Age of Onset  . Long QT syndrome Mother   . Heart disease Mother     before age 60  . Hypertension Mother   . Long QT syndrome Sister   . Heart disease Sister   . Long QT syndrome Brother   . Heart disease Brother   . Heart attack Brother   . Long QT syndrome Daughter   . Heart disease Daughter   . Coronary artery disease Sister 63      Allergies  Allergies  Allergen Reactions  . Penicillins      Current Outpatient Prescriptions  Medication Sig Dispense Refill  . aspirin EC 325 MG EC tablet Take 1 tablet (325 mg total) by mouth daily.  30 tablet    . carvedilol (COREG) 12.5 MG tablet Take 25 mg by mouth 2 (two) times daily.      . esomeprazole (NEXIUM) 40 MG capsule Take 40 mg by mouth daily before breakfast.      . furosemide (LASIX) 40 MG tablet Take 40 mg by mouth 2 (two) times daily.      . losartan (COZAAR) 100 MG tablet Take 100 mg by mouth daily.        . PARoxetine (PAXIL) 20 MG tablet Take 5 mg by mouth daily.       . zolpidem (AMBIEN) 10 MG tablet Take 10 mg by mouth at bedtime as needed. For sleep       . chlorpheniramine-HYDROcodone (TUSSIONEX PENNKINETIC ER) 10-8 MG/5ML LQCR Take 5 mLs by mouth every  12 (twelve) hours as needed.  120 mL  0  . metFORMIN (GLUCOPHAGE) 500 MG tablet Take 500 mg by mouth daily.         No current facility-administered medications for this visit.   Facility-Administered Medications Ordered in Other Visits  Medication Dose Route Frequency Provider Last Rate Last Dose  . iohexol (OMNIPAQUE) 350 MG/ML injection 100 mL  100 mL Intravenous Once PRN Medication Radiologist, MD   100 mL at 07/03/11 1436    ROS:   General:  No weight loss, Fever, chills  HEENT: No recent headaches, no nasal bleeding, no visual changes, no sore throat  Neurologic: No dizziness, blackouts, seizures. No recent symptoms of stroke or mini- stroke. No recent episodes of slurred speech, or temporary blindness.  Cardiac: No recent episodes of chest pain/pressure, no shortness of breath at rest.  No shortness of breath with exertion  Vascular: No history of rest pain in feet.  No history of claudication.  No history of non-healing ulcer, No history of DVT   Pulmonary: No home oxygen, no productive cough, no hemoptysis,  Musculoskeletal:  [ ] Arthritis, [ ] Low back pain,  [ ] Joint pain  Hematologic:No history of hypercoagulable state.  No history of easy bleeding.  No history of anemia  Gastrointestinal: No hematochezia or melena,  No gastroesophageal reflux, no trouble swallowing  Urinary: [ ] chronic Kidney disease, [ ] on HD - [ ] MWF or [ ] TTHS, [ ] Burning with urination, [ ] Frequent urination, [ ] Difficulty urinating;   Skin: No rashes  Psychological: No history of anxiety,  No history of depression   Physical Examination  Filed Vitals:   07/04/11 0849  BP: 194/121  Pulse: 77  Temp: 98.6 F (37 C)  TempSrc: Oral  Height: 5' 4" (1.626 m)  Weight: 161 lb (73.029 kg)  SpO2: 100%    Body mass index is 27.64 kg/(m^2).  General:  Alert and oriented, no acute distress HEENT: Normal Neck: No bruit or JVD Pulmonary: Clear to auscultation bilaterally Cardiac:  Regular Rate and Rhythm without murmur Abdomen: Soft, non-tender, non-distended, no mass, lower midline scar Skin: No rash Extremity Pulses:  2+ radial, brachial, femoral, dorsalis pedis bilaterally Musculoskeletal: No deformity or edema  Neurologic: Upper and lower extremity motor 5/5 and symmetric   ASSESSMENT:   CT findings were discussed with the patient today. I believe that she would be a candidate for ureter endovascular   or open abdominal aortic aneurysm repair. She has opted for open repair at this point because she does not necessarily want to return for the followup that is required for stent grafting. Most likely this can be done with a tube graft repair but may require aortobiiliac grafting. Risks benefits possible complications and procedure details including but limited to bleeding infection need for transfusion myocardial events worse when the patient today she understands and agrees to proceed.  I do not believe she necessarily needs repeat cardiac risk stratification by the fact she recently underwent coronary bypass grafting.   PLAN:  Open abdominal aortic aneurysm repair 07/15/2011  Alisse Tuite, MD [facility] [place] Office: [phone] Pager: [phone]  

## 2011-07-29 NOTE — Op Note (Signed)
Procedure: Repair of abdominal aortic aneurysm  Preoperative diagnosis: Abdominal aortic aneurysm  Postoperative diagnosis: Same  Anesthesia: Gen.  Assistant: Azalia Bilis PA-C  Operative findings: 18 mm Dacron graft from just below take off of renal arteries to the aortic bifurcation  Operative details: After obtaining informed consent, the patient was taken to the operating room. The patient was placed in supine position on the operating room table. After induction of general anesthesia and endotracheal intubation, a Foley catheter and nasogastric tube was placed. Next the patient was prepped and draped in usual sterile fashion from the nipples to the knees. A  midline laparotomy incision was made extending from the xiphoid down to the pubis. Incision was carried into the subcutaneous tissues and through the fascia and peritoneum. There were some omental adhesions to the lower portion of the abdomen and these are taken down with cautery.  The transverse colon was mobilized superiorly the small bowel and duodenum were reflected to the right.  The retroperitoneum was entered.  The Omni retractor was used to assist in retraction. Dissection was carried up to the level of the left renal vein. To facilitate exposure of the aortic neck the left renal vein was divided between [facility] clamps. This was divided just proximal to the take off of the gonadal and adrenal branches. This was done with a running 5-0 Prolene suture on the proximal and distal end of the vein. After the left renal vein was identified a suitable area for clamping on the infrarenal portion of the aorta was dissected free. The left renal and right renal arteries were identified on the inferior portion. Dissection was then carried down to level of the inferior mesenteric artery. This was dissected free circumferentially and a vessel loop placed around it. Dissection was then carried down to the aortic bifurcation. The left and right common  iliac arteries were dissected free circumferentially and an umbilical tape placed around these. The patient was given 8000 units of intravenous heparin. The patient was given an additional 3000 units of heparin the course of the case. The left and right common iliac arteries were controlled with Henley clamps. An aortic DeBakey clamp was used to control the aorta just below the level of the renal arteries. The aneurysm was entered with cautery. A large amount of thrombus was removed. There was a large amount of laminated thrombus material and as much as possible was evacuated and thoroughly irrigated with normal saline solution. One lumbar artery was ligated with a figure-of-eight suture. The proximal neck of aorta was fashioned and an 18 mm Dacron graft properly operative field and sewn end-to-end to this using a running 3-0 Prolene suture. At completion of the anastomosis this was tested and there was one leak along the patient's right side this was repaired with 2 pledgeted 3-0 Prolene sutures. The aortic clamp was then moved down below the level anastomosis. The graft was then cut for length to the distal anastomosis. The distal aorta was fashioned and prepared for anastomosis. Graft was then sent end-to-end to the aortic bifurcation using a running 3-0 Prolene suture. Just prior to completion of the anastomosis everything was thoroughly forebled backbled and thoroughly flushed from the iliacs and aorta. The anastomosis was secured clamps released there is initial pressure drop on unclamping of the right common iliac artery and this quickly recovered. On unclamping the left common iliac artery was also brief pressure drop and this quickly recovered. One repair stitch was placed on the anterior wall of the distal  anastomosis. Thrombin And Gelfoam were applied in the retroperitoneum around the area of the aorta. Heparin was reversed with 100 mg of protamine. Hemostasis was obtained. The aortic wall was  reapproximated using a running 3-0 Vicryl suture. The retroperitoneum was closed so there was adequate coverage of the suture lines using again a running 3-0 Vicryl suture. The viscera return to their normal position the Omni retractor was removed. The small bowel transverse and sigmoid colon were all viable. There was pulsatile bleeding from the inferior mesenteric artery before closing the aortic sac. Therefore this was ligated.  Next, the fascia was reapproximated with a running #1 PDS suture. The skin was then closed with staples. Instrument sponge and needle counts were correct in the case. The patient was taken to the intensive care unit on a ventilator in stable condition.  [name], MD [facility] [place] Office: [phone] Pager: [phone]

## 2011-07-29 NOTE — Interval H&P Note (Signed)
History and Physical Interval Note:  07/29/2011 7:30 AM  Lori York  has presented today for surgery, with the diagnosis of AAA  The various methods of treatment have been discussed with the patient and family. After consideration of risks, benefits and other options for treatment, the patient has consented to  Procedure(s) (LRB): ANEURYSM ABDOMINAL AORTIC REPAIR (N/A) as a surgical intervention .  The patients' history has been reviewed, patient examined, no change in status, stable for surgery.  I have reviewed the patients' chart and labs.  Questions were answered to the patient's satisfaction.     Kylene Zamarron E

## 2011-07-29 NOTE — Procedures (Signed)
Extubation Procedure Note  Patient Details:   Name: Lori York DOB: 04/21/1945 MRN: [id number]   Airway Documentation:     Evaluation  O2 sats: stable throughout Complications: No apparent complications Patient did tolerate procedure well. Bilateral Breath Sounds: Clear (coarse)   Yes  Pt tolerated rapid wean, achieved VC,  -20 on NIF and positive for cuff leak. Pt extubated to 4lpm [place] and all vital signs were within normal limits.  Pt demonstrated knowledge of how to correctly use IS and achieved around x 3. No changes at this time. RT will continue to monitor.   Parke Poisson 07/29/2011, 6:43 PM

## 2011-07-29 NOTE — Anesthesia Preprocedure Evaluation (Signed)
Anesthesia Evaluation  Patient identified by MRN, date of birth, ID band Patient awake    Reviewed: Allergy & Precautions, H&P , NPO status , Patient's Chart, lab work & pertinent test results  Airway Mallampati: II  Neck ROM: full    Dental   Pulmonary shortness of breath, asthma , sleep apnea ,          Cardiovascular hypertension, + CAD and + CABG + dysrhythmias + Cardiac Defibrillator     Neuro/Psych Seizures -,   Neuromuscular disease    GI/Hepatic GERD-  ,  Endo/Other  Diabetes mellitus-, Type 2  Renal/GU      Musculoskeletal  (+) Fibromyalgia -  Abdominal   Peds  Hematology   Anesthesia Other Findings   Reproductive/Obstetrics                           Anesthesia Physical Anesthesia Plan  ASA: IV  Anesthesia Plan: General   Post-op Pain Management:    Induction: Intravenous  Airway Management Planned: Oral ETT  Additional Equipment: Arterial line and CVP  Intra-op Plan:   Post-operative Plan: Possible Post-op intubation/ventilation  Informed Consent: I have reviewed the patients History and Physical, chart, labs and discussed the procedure including the risks, benefits and alternatives for the proposed anesthesia with the patient or authorized representative who has indicated his/her understanding and acceptance.     Plan Discussed with: CRNA and Surgeon  Anesthesia Plan Comments:         Anesthesia Quick Evaluation

## 2011-07-29 NOTE — Progress Notes (Signed)
MEDICATION RELATED CONSULT NOTE - INITIAL   Pharmacy Consult for Antibiotic Adjustment Indication: Post-op Vancomycin  Allergies  Allergen Reactions  . Tape Other (See Comments)    Pt states she struggles with removing the adhesive...   . Penicillins Rash  Patient Measurements: Weight: 158 lb 4.6 oz (71.8 kg) Vital Signs: Temp: 98.2 F (36.8 C) (06/17 0604) Temp src: Oral (06/17 0549) BP: 160/84 mmHg (06/17 0549) Pulse Rate: 73  (06/17 0549) Labs: none last 72 hrs Microbiology: Recent Results (from the past 720 hour(s))  SURGICAL PCR SCREEN     Status: Normal   Collection Time   07/25/11  3:02 PM      Component Value Range Status Comment   MRSA, PCR NEGATIVE  NEGATIVE Final    Staphylococcus aureus NEGATIVE  NEGATIVE Final    Medications:  Anti-infectives     Start     Dose/Rate Route Frequency Ordered Stop   07/29/11 1945   vancomycin (VANCOCIN) IVPB 1000 mg/200 mL premix        1,000 mg 200 mL/hr over 60 Minutes Intravenous Every 12 hours 07/29/11 1404 07/30/11 1944   07/28/11 1450   vancomycin (VANCOCIN) IVPB 1000 mg/200 mL premix        1,000 mg 200 mL/hr over 60 Minutes Intravenous 60 min pre-op 07/28/11 1450 07/29/11 0748         Assessment: 65 YOF s/p AAA repair and PCN allergy (rash) to receive 2 doses of vancomycin post-op.  SCr 1.60 on 6/13/est CrCl~40 ml/min. WBC wnl on 07/25/11.  Afebrile  Plan:  Continue Vancomycin 1g IV q12 x2 doses. No adjustment needed-- will sign off.   Fayne Norrie 07/29/2011,2:07 PM

## 2011-07-29 NOTE — Transfer of Care (Signed)
Immediate Anesthesia Transfer of Care Note  Patient: Lori York  Procedure(s) Performed: Procedure(s) (LRB): ANEURYSM ABDOMINAL AORTIC REPAIR (N/A)  Patient Location: PACU  Anesthesia Type: General  Level of Consciousness: Patient remains intubated per anesthesia plan  Airway & Oxygen Therapy: Patient remains intubated per anesthesia plan and Patient placed on Ventilator (see vital sign flow sheet for setting)  Post-op Assessment: Report given to PACU RN and Post -op Vital signs reviewed and stable  Post vital signs: Reviewed and stable  Complications: No apparent anesthesia complications

## 2011-07-29 NOTE — Preoperative (Signed)
Beta Blockers   Reason not to administer Beta Blockers:Not Applicable 

## 2011-07-30 ENCOUNTER — Inpatient Hospital Stay (HOSPITAL_COMMUNITY): Payer: Medicare Other

## 2011-07-30 ENCOUNTER — Encounter (HOSPITAL_COMMUNITY): Payer: Self-pay | Admitting: Vascular Surgery

## 2011-07-30 LAB — COMPREHENSIVE METABOLIC PANEL
AST: 23 U/L (ref 0–37)
Albumin: 3 g/dL — ABNORMAL LOW (ref 3.5–5.2)
Alkaline Phosphatase: 60 U/L (ref 39–117)
BUN: 35 mg/dL — ABNORMAL HIGH (ref 6–23)
CO2: 23 mEq/L (ref 19–32)
Chloride: 103 mEq/L (ref 96–112)
Creatinine, Ser: 2.23 mg/dL — ABNORMAL HIGH (ref 0.50–1.10)
GFR calc non Af Amer: 22 mL/min — ABNORMAL LOW (ref >90)
Potassium: 4.3 mEq/L (ref 3.5–5.1)
Total Bilirubin: 0.3 mg/dL (ref 0.3–1.2)

## 2011-07-30 LAB — GLUCOSE, CAPILLARY
Glucose-Capillary: 111 mg/dL — ABNORMAL HIGH (ref 70–99)
Glucose-Capillary: 121 mg/dL — ABNORMAL HIGH (ref 70–99)
Glucose-Capillary: 121 mg/dL — ABNORMAL HIGH (ref 70–99)
Glucose-Capillary: 190 mg/dL — ABNORMAL HIGH (ref 70–99)

## 2011-07-30 LAB — TYPE AND SCREEN: Unit division: 0

## 2011-07-30 LAB — MAGNESIUM: Magnesium: 1.6 mg/dL (ref 1.5–2.5)

## 2011-07-30 LAB — CBC
HCT: 41 % (ref 36.0–46.0)
Hemoglobin: 13.7 g/dL (ref 12.0–15.0)
MCV: 87.6 fL (ref 78.0–100.0)
RBC: 4.68 MIL/uL (ref 3.87–5.11)
WBC: 24.9 10*3/uL — ABNORMAL HIGH (ref 4.0–10.5)

## 2011-07-30 LAB — POCT I-STAT 3, ART BLOOD GAS (G3+)
pCO2 arterial: 54.1 mmHg — ABNORMAL HIGH (ref 35.0–45.0)
pH, Arterial: 7.274 — ABNORMAL LOW (ref 7.350–7.400)

## 2011-07-30 LAB — AMYLASE: Amylase: 169 U/L — ABNORMAL HIGH (ref 0–105)

## 2011-07-30 MED ORDER — SODIUM BICARBONATE 8.4 % IV SOLN
50.0000 meq | Freq: Once | INTRAVENOUS | Status: AC
  Administered 2011-07-30: 50 meq via INTRAVENOUS
  Filled 2011-07-30: qty 50

## 2011-07-30 MED ORDER — SODIUM CHLORIDE 0.9 % IV SOLN
Freq: Once | INTRAVENOUS | Status: AC
  Administered 2011-07-30: 09:00:00 via INTRAVENOUS

## 2011-07-30 MED ORDER — MORPHINE SULFATE 4 MG/ML IJ SOLN
INTRAMUSCULAR | Status: AC
  Administered 2011-07-30: 4 mg
  Filled 2011-07-30: qty 1

## 2011-07-30 MED FILL — Heparin Sodium (Porcine) Inj 1000 Unit/ML: INTRAMUSCULAR | Qty: 30 | Status: AC

## 2011-07-30 MED FILL — Sodium Chloride Irrigation Soln 0.9%: Qty: 3000 | Status: AC

## 2011-07-30 MED FILL — Sodium Chloride IV Soln 0.9%: INTRAVENOUS | Qty: 1000 | Status: AC

## 2011-07-30 NOTE — Evaluation (Signed)
Occupational Therapy Evaluation Patient Details Name: Lori York MRN: [id number] DOB: 15-Sep-1945 Today's Date: 07/30/2011 Time: 5621-3086 OT Time Calculation (min): 18 min  OT Assessment / Plan / Recommendation Clinical Impression  Pt. presents s/p AAA repair and will benefit from skilled OT to increase functional independence to supervision level at D/C home    OT Assessment  Patient needs continued OT Services    Follow Up Recommendations  Home health OT;Supervision/Assistance - 24 hour    Barriers to Discharge None    Equipment Recommendations  None recommended by OT       Frequency  Min 2X/week    Precautions / Restrictions Precautions Precautions: Fall Restrictions Weight Bearing Restrictions: No       ADL  Eating/Feeding: Simulated;Set up Where Assessed - Eating/Feeding: Chair Grooming: Performed;Wash/dry face;Set up Where Assessed - Grooming: Supported sitting Upper Body Bathing: Simulated;Minimal assistance Where Assessed - Upper Body Bathing: Supported sitting Lower Body Bathing: Simulated;Minimal assistance Where Assessed - Lower Body Bathing: Unsupported sit to stand Upper Body Dressing: Simulated;Minimal assistance (don gown) Where Assessed - Upper Body Dressing: Unsupported sitting Lower Body Dressing: Simulated;Minimal assistance Where Assessed - Lower Body Dressing: Unsupported sit to stand Toilet Transfer: Simulated;+2 Total assistance Toilet Transfer: Patient Percentage: 70% Toilet Transfer Method: Stand pivot Acupuncturist: Other (comment) Nurse, children's) Toileting - Clothing Manipulation and Hygiene: Simulated;Minimal assistance Where Assessed - Engineer, mining and Hygiene: Sit to stand from 3-in-1 or toilet Tub/Shower Transfer Method: Not assessed Transfers/Ambulation Related to ADLs: Mod verbal cues for hand placement and technique for safety ADL Comments: Pt. groggy due to pain meds and needing increased verbal  cues for directions. pt. demonstrated ability to cross foot over opposite knee to complete LB ADLs.    OT Diagnosis: Acute pain;Generalized weakness  OT Problem List: Decreased strength;Decreased activity tolerance;Impaired balance (sitting and/or standing);Decreased knowledge of use of DME or AE;Pain OT Treatment Interventions: Self-care/ADL training;DME and/or AE instruction;Therapeutic activities;Patient/family education;Balance training   OT Goals Acute Rehab OT Goals OT Goal Formulation: With patient Time For Goal Achievement: 08/13/11 Potential to Achieve Goals: Good ADL Goals Pt Will Perform Grooming: with set-up;with supervision;Standing at sink ADL Goal: Grooming - Progress: Goal set today Pt Will Perform Upper Body Dressing: with set-up;Sitting, bed ADL Goal: Upper Body Dressing - Progress: Goal set today Pt Will Perform Lower Body Dressing: with set-up;with supervision;Sit to stand from chair ADL Goal: Lower Body Dressing - Progress: Goal set today Pt Will Transfer to Toilet: with supervision;with DME;Ambulation;3-in-1 ADL Goal: Toilet Transfer - Progress: Goal set today Pt Will Perform Tub/Shower Transfer: Tub transfer;with min assist;with DME;Shower seat with back ADL Goal: Web designer - Progress: Goal set today Additional ADL Goal #1: Pt. will complete ADL task without rest break ADL Goal: Additional Goal #1 - Progress: Goal set today  Visit Information  Last OT Received On: 07/30/11 Assistance Needed: +2 PT/OT Co-Evaluation/Treatment: Yes    Subjective Data  Subjective: i am groggy Patient Stated Goal: go home   Prior Functioning  Home Living Lives With: Son;Other (Comment) (niece and 2 sons) Available Help at Discharge: Family;Available 24 hours/day (they make sure someone is around all the time) Type of Home: House Home Access: Stairs to enter [employer] of Steps: 5 Entrance Stairs-Rails:  (Rail is rotting) Home Layout: One  level Bathroom Shower/Tub: Forensic scientist: Standard Bathroom Accessibility: Yes How Accessible: Accessible via walker Home Adaptive Equipment: Bedside commode/3-in-1;Shower chair with back;Walker - rolling Prior Function Level of Independence: Independent Able to Take  Stairs?: Yes Driving: Yes Communication Communication: No difficulties    Cognition  Overall Cognitive Status: Impaired (due to pain meds ) Area of Impairment: Following commands;Safety/judgement Arousal/Alertness: Lethargic Orientation Level: Appears intact for tasks assessed Behavior During Session: Lethargic Following Commands: Follows multi-step commands with increased time Safety/Judgement: Decreased awareness of safety precautions;Decreased safety judgement for tasks assessed    Extremity/Trunk Assessment Right Upper Extremity Assessment RUE ROM/Strength/Tone: Within functional levels RUE Sensation: WFL - Light Touch RUE Coordination: WFL - gross/fine motor Left Upper Extremity Assessment LUE ROM/Strength/Tone: Within functional levels LUE Sensation: WFL - Light Touch LUE Coordination: WFL - gross/fine motor   Mobility Bed Mobility Bed Mobility: Rolling Left;Left Sidelying to Sit;Sitting - Scoot to Edge of Bed Rolling Left: 4: Min assist;With rail Left Sidelying to Sit: 4: Min assist;With rails Sitting - Scoot to Edge of Bed: 3: Mod assist Details for Bed Mobility Assistance: Mod verbal cues for hand placement and technique. Assist for trunk elevation off bed and use of pad to facilitate hips EOB Transfers Transfers: Sit to Stand;Stand to Sit Sit to Stand: 1: +2 Total assist;From bed;With upper extremity assist Sit to Stand: Patient Percentage: 70% Stand to Sit: 4: Min assist;To chair/3-in-1 Details for Transfer Assistance: Mod verbal cues for hand placement and technique         End of Session OT - End of Session Equipment Utilized During Treatment: Gait belt Activity  Tolerance: Patient tolerated treatment well Patient left: in chair;with call bell/phone within reach Nurse Communication: Mobility status   Rekisha Welling, OTR/L Pager [phone] 07/30/2011, 1:25 PM

## 2011-07-30 NOTE — Progress Notes (Addendum)
[facility] Progress Note   CV:   Regular On Nipride this am. Filed Vitals:   07/30/11 0700  BP: 137/66  Pulse: 88  Temp: 99.5 F (37.5 C)  Resp: 13    LUNGS:  Extubated last pm; decreased BS at bases bilaterally. chest X-ray: bibasilar atelectasis; No PTX  NEURO: in tact-without deficit  RENAL:  Intake/Output Summary (Last 24 hours) at 07/30/11 0722 Last data filed at 07/30/11 0700  Gross per 24 hour  Intake 6410.1 ml  Output   3095 ml  Net 3315.1 ml   No data found.  BMET    Component Value Date/Time   NA 137 07/30/2011 0420   K 4.3 07/30/2011 0420   CL 103 07/30/2011 0420   CO2 23 07/30/2011 0420   GLUCOSE 126* 07/30/2011 0420   BUN 35* 07/30/2011 0420   CREATININE 2.23* 07/30/2011 0420   CREATININE 1.03 07/01/2011 1453   CALCIUM 8.9 07/30/2011 0420   GFRNONAA 22* 07/30/2011 0420   GFRAA 26* 07/30/2011 0420     HEME/ID Tm 99.7 now 99.5  CBC    Component Value Date/Time   WBC 24.9* 07/30/2011 0420   RBC 4.68 07/30/2011 0420   HGB 13.7 07/30/2011 0420   HCT 41.0 07/30/2011 0420   PLT 150 07/30/2011 0420   MCV 87.6 07/30/2011 0420   MCH 29.3 07/30/2011 0420   MCHC 33.4 07/30/2011 0420   RDW 16.7* 07/30/2011 0420   LYMPHSABS 1.3 12/31/2010 1301   MONOABS 0.6 12/31/2010 1301   EOSABS 0.0 12/31/2010 1301   BASOSABS 0.0 12/31/2010 1301    Blood Culture No results found for this basename: sdes, specrequest, cult, reptstatus    ABDOMEN:  Soft; NT/ND -BS  EXTREMITIES:  Warm and well perfused; no edema bilaterally  A/P: 66 y.o. female who is s/p AAA POD 1 -Creatinine up to 2.23 from 1.40 with decreasing UOP-continue IVF-will continue to monitor.  May need renal consult -WBC up this am-continue to monitor most likely reactive -keep A line while still on Nipride and has also gotten hydralazine a couple of times. -ck labs in am -AICD check this am (in progress)  [name], PA-C [facility] [phone]  Overall stable,  hypertension controlled with Nipride Abdomen soft  Still mildly acidotic probably resp compensation for metabolic which is probably some renal tubular acidosis but need to keep ischemic bowel in differential. No bloody stool.  D/c Ernestine Conrad Out of bed  [name], MD [facility] [place] Office: [phone] Pager: [phone]

## 2011-07-30 NOTE — Anesthesia Postprocedure Evaluation (Signed)
  Anesthesia Post-op Note  Patient: Lori York  Procedure(s) Performed: Procedure(s) (LRB): ANEURYSM ABDOMINAL AORTIC REPAIR (N/A)  Patient Location: ICU  Anesthesia Type: General  Level of Consciousness: sedated  Airway and Oxygen Therapy: Patient remains intubated per anesthesia plan  Post-op Pain: none  Post-op Assessment: Post-op Vital signs reviewed, Patient's Cardiovascular Status Stable and Respiratory Function Stable  Post-op Vital Signs: Reviewed and stable  Complications: No apparent anesthesia complications

## 2011-07-30 NOTE — Progress Notes (Signed)
AM ABG results called to Dr Darrick Penna. No new orders received. Will continue to monitor patient.

## 2011-07-30 NOTE — Evaluation (Signed)
Physical Therapy Evaluation Patient Details Name: Lori York MRN: [id number] DOB: [birth year] Today's Date: 07/30/2011 Time: 0865-7846 PT Time Calculation (min): 18 min  PT Assessment / Plan / Recommendation Clinical Impression  Patient s/p AAA repair with decr mobility secondary to decr strength, decr endurance and incr nausea.  Patient will need continued therapy to address mobility issues.      PT Assessment  Patient needs continued PT services    Follow Up Recommendations  Home health PT;Supervision/Assistance - 24 hour    Barriers to Discharge  None      lEquipment Recommendations  None recommended by PT    Recommendations for Other Services   None  Frequency Min 3X/week    Precautions / Restrictions Precautions Precautions: Fall Restrictions Weight Bearing Restrictions: No   Pertinent Vitals/Pain VSS, Some pain      Mobility  Bed Mobility Bed Mobility: Rolling Left;Left Sidelying to Sit;Sitting - Scoot to Edge of Bed Rolling Left: 4: Min assist;With rail Left Sidelying to Sit: 4: Min assist;With rails Sitting - Scoot to Edge of Bed: 3: Mod assist Details for Bed Mobility Assistance: mod verbal cues for hand placement and technique.  Assist for trunk elevation off bed and use of pad to facilitate hips EOB.   Transfers Transfers: Stand to Sit;Sit to Stand;Stand Pivot Transfers Sit to Stand: 1: +2 Total assist;From bed;Without upper extremity assist Sit to Stand: Patient Percentage: 70% Stand to Sit: 4: Min assist;To chair/3-in-1 Stand Pivot Transfers: 1: +2 Total assist Stand Pivot Transfers: Patient Percentage: 70% Details for Transfer Assistance: mod verbal cues for hand placement and technique Ambulation/Gait Ambulation/Gait Assistance: Not tested (comment) Stairs: No Wheelchair Mobility Wheelchair Mobility: No         PT Diagnosis: Generalized weakness;Acute pain  PT Problem List: Decreased activity tolerance;Decreased balance;Decreased  mobility;Decreased safety awareness;Decreased knowledge of use of DME;Pain PT Treatment Interventions: DME instruction;Gait training;Functional mobility training;Therapeutic activities;Therapeutic exercise;Balance training;Patient/family education   PT Goals Acute Rehab PT Goals PT Goal Formulation: With patient Time For Goal Achievement: 08/13/11 Potential to Achieve Goals: Good Pt will go Supine/Side to Sit: Independently PT Goal: Supine/Side to Sit - Progress: Goal set today Pt will Sit at Edge of Bed: Independently;6-10 min;with no upper extremity support PT Goal: Sit at Edge Of Bed - Progress: Goal set today Pt will go Sit to Stand: with supervision;without upper extremity assist PT Goal: Sit to Stand - Progress: Goal set today Pt will go Stand to Sit: with supervision;without upper extremity assist PT Goal: Stand to Sit - Progress: Goal set today Pt will Transfer Bed to Chair/Chair to Bed: with supervision PT Transfer Goal: Bed to Chair/Chair to Bed - Progress: Goal set today Pt will Ambulate: 51 - 150 feet;with supervision;with least restrictive assistive device PT Goal: Ambulate - Progress: Goal set today Pt will Go Up / Down Stairs: 3-5 stairs;with supervision;with least restrictive assistive device PT Goal: Up/Down Stairs - Progress: Goal set today  Visit Information  Last PT Received On: 07/30/11 Assistance Needed: +2 PT/OT Co-Evaluation/Treatment: Yes    Subjective Data  Subjective: "I feel dizzy." Patient Stated Goal: To go home   Prior Functioning  Home Living Lives With: Son;Other (Comment) (niece and two sons) Available Help at Discharge: Family;Available 24 hours/day (they make sure someone is around all the time) Type of Home: House Home Access: Stairs to enter [employer] of Steps: 5 Entrance Stairs-Rails:  (rail is rotten) Home Layout: One level Bathroom Shower/Tub: Forensic scientist: Standard Bathroom Accessibility:  Yes  How Accessible: Accessible via walker Home Adaptive Equipment: Bedside commode/3-in-1;Shower chair with back;Walker - rolling Prior Function Level of Independence: Independent Able to Take Stairs?: Yes Driving: Yes Communication Communication: No difficulties    Cognition  Overall Cognitive Status: Impaired (due to pain meds) Area of Impairment: Following commands;Safety/judgement Arousal/Alertness: Lethargic Orientation Level: Appears intact for tasks assessed Behavior During Session: Lethargic Following Commands: Follows multi-step commands with increased time Safety/Judgement: Decreased awareness of safety precautions;Decreased safety judgement for tasks assessed    Extremity/Trunk Assessment Right Upper Extremity Assessment RUE ROM/Strength/Tone: Within functional levels RUE Sensation: WFL - Light Touch RUE Coordination: WFL - gross/fine motor Left Upper Extremity Assessment LUE ROM/Strength/Tone: Within functional levels LUE Sensation: WFL - Light Touch LUE Coordination: WFL - gross/fine motor Right Lower Extremity Assessment RLE ROM/Strength/Tone: Within functional levels RLE Sensation: WFL - Light Touch RLE Coordination: WFL - gross/fine motor Left Lower Extremity Assessment LLE ROM/Strength/Tone: Within functional levels LLE Sensation: WFL - Light Touch LLE Coordination: WFL - gross/fine motor Trunk Assessment Trunk Assessment: Normal   Balance Balance Balance Assessed: Yes Static Sitting Balance Static Sitting - Balance Support: Feet supported;No upper extremity supported Static Sitting - Level of Assistance: 3: Mod assist Static Sitting - Comment/# of Minutes: 2 minutes with patient losing balance to her left.    End of Session PT - End of Session Equipment Utilized During Treatment: Gait belt Activity Tolerance: Patient limited by fatigue;Patient limited by pain (limited by dizziness) Patient left: in chair;with call bell/phone within reach Nurse  Communication: Mobility status;Patient requests pain meds   INGOLD,Lori York 07/30/2011, 3:34 PM  [facility] [phone] (pager)

## 2011-07-31 LAB — CBC
HCT: 32.9 % — ABNORMAL LOW (ref 36.0–46.0)
Hemoglobin: 10.6 g/dL — ABNORMAL LOW (ref 12.0–15.0)
MCV: 88.2 fL (ref 78.0–100.0)
RBC: 3.73 MIL/uL — ABNORMAL LOW (ref 3.87–5.11)
RDW: 17 % — ABNORMAL HIGH (ref 11.5–15.5)
WBC: 17.9 10*3/uL — ABNORMAL HIGH (ref 4.0–10.5)

## 2011-07-31 LAB — BASIC METABOLIC PANEL
BUN: 40 mg/dL — ABNORMAL HIGH (ref 6–23)
CO2: 23 mEq/L (ref 19–32)
Chloride: 104 mEq/L (ref 96–112)
Creatinine, Ser: 2.69 mg/dL — ABNORMAL HIGH (ref 0.50–1.10)
Glucose, Bld: 147 mg/dL — ABNORMAL HIGH (ref 70–99)
Potassium: 4.3 mEq/L (ref 3.5–5.1)

## 2011-07-31 LAB — GLUCOSE, CAPILLARY
Glucose-Capillary: 128 mg/dL — ABNORMAL HIGH (ref 70–99)
Glucose-Capillary: 135 mg/dL — ABNORMAL HIGH (ref 70–99)
Glucose-Capillary: 139 mg/dL — ABNORMAL HIGH (ref 70–99)
Glucose-Capillary: 90 mg/dL (ref 70–99)

## 2011-07-31 NOTE — Progress Notes (Signed)
Occupational Therapy Treatment Patient Details Name: Lori York MRN: [id number] DOB: 03-10-1945 Today's Date: 07/31/2011 Time: 4132-4401 OT Time Calculation (min): 30 min  OT Assessment / Plan / Recommendation Comments on Treatment Session Pt with improvements in mobility and cognition today. Con't to recommend HHOT with 24/7 supervision.    Follow Up Recommendations  Home health OT;Supervision/Assistance - 24 hour    Barriers to Discharge       Equipment Recommendations  None recommended by OT    Recommendations for Other Services    Frequency Min 2X/week   Plan Discharge plan remains appropriate    Precautions / Restrictions Precautions Precautions: Fall   Pertinent Vitals/Pain     ADL  Lower Body Bathing: Performed;Minimal assistance Where Assessed - Lower Body Bathing: Supported standing Toilet Transfer: Simulated;+2 Total assistance Toilet Transfer: Patient Percentage: 70% Toilet Transfer Method: Sit to Barista: Other (comment) (recliner) Transfers/Ambulation Related to ADLs: Pt did well ambulated ~360 feet with min A. Does fatigue quickly. Pleasant, cooperative throughout, laughing and joking with therapist.    OT Diagnosis:    OT Problem List:   OT Treatment Interventions:     OT Goals ADL Goals ADL Goal: Upper Body Dressing - Progress: Progressing toward goals ADL Goal: Toilet Transfer - Progress: Progressing toward goals ADL Goal: Additional Goal #1 - Progress: Progressing toward goals  Visit Information  Last OT Received On: 07/31/11 Assistance Needed: +2 PT/OT Co-Evaluation/Treatment: Yes    Subjective Data  Subjective: My L leg feels numb.   Prior Functioning       Cognition  Overall Cognitive Status: Impaired (likely due to pain meds.) Arousal/Alertness: Awake/alert Orientation Level: Disoriented to;Time Behavior During Session: [facility] for tasks performed Following Commands: Follows multi-step commands with  increased time    Mobility Bed Mobility Bed Mobility: Sit to Supine Sit to Supine: 3: Mod assist Details for Bed Mobility Assistance: min VCs for technique, sequencing and hand placement. Transfers Sit to Stand: 1: +2 Total assist;With upper extremity assist;With armrests Sit to Stand: Patient Percentage: 70% Stand to Sit: 4: Min assist Details for Transfer Assistance: min VCs for hand placement.   Exercises    Balance Static Sitting Balance Static Sitting - Balance Support: Bilateral upper extremity supported;Feet supported Static Sitting - Level of Assistance: 5: Stand by assistance  End of Session OT - End of Session Equipment Utilized During Treatment: Gait belt Activity Tolerance: Patient tolerated treatment well Patient left: in chair;with call bell/phone within reach   Darian Cansler A OTR/L [phone] 07/31/2011, 9:46 AM

## 2011-07-31 NOTE — Care Management Note (Unsigned)
    Page 1 of 1   07/31/2011     3:04:27 PM   CARE MANAGEMENT NOTE 07/31/2011  Patient:  Lori York, Lori York   Account Number:  [id number]  Date Initiated:  07/31/2011  Documentation initiated by:  Sabri Teal  Subjective/Objective Assessment:   PT S/P AAA REPAIR ON 07/29/11.  PTA, PT INDEPENDENT, LIVES WITH NIECE, WHO WORKS, AND SON.     Action/Plan:   PT STATES SHE HAS 24HR CARE BETWEEN SON AND NIECE.  WILL FOLLOW FOR HOME NEEDS AS PT PROGRESSES.  WILL LIKELY NEED HOME HEALTH CARE.   Anticipated DC Date:  08/04/2011   Anticipated DC Plan:  HOME W HOME HEALTH SERVICES      DC Planning Services  CM consult      Choice offered to / List presented to:             Status of service:  In process, will continue to follow Medicare Important Message given?   (If response is "NO", the following Medicare IM given date fields will be blank) Date Medicare IM given:   Date Additional Medicare IM given:    Discharge Disposition:    Per UR Regulation:    If discussed at Long Length of Stay Meetings, dates discussed:    Comments:

## 2011-07-31 NOTE — Progress Notes (Addendum)
[facility] Progress Note   CV:  110s-140s systolic  HR 80-100 reg  Still requiring Nipride  Filed Vitals:   07/31/11 0630  BP: 131/57  Pulse: 86  Temp:   Resp: 25    LUNGS: extubated -decreased BS throughout -95% 2LO2NC  NEURO:  In tact without deficit  RENAL:  UOP improving  Intake/Output Summary (Last 24 hours) at 07/31/11 0726 Last data filed at 07/31/11 0700  Gross per 24 hour  Intake 3462.1 ml  Output   1305 ml  Net 2157.1 ml   No data found.  BMET    Component Value Date/Time   NA 136 07/31/2011 0434   K 4.3 07/31/2011 0434   CL 104 07/31/2011 0434   CO2 23 07/31/2011 0434   GLUCOSE 147* 07/31/2011 0434   BUN 40* 07/31/2011 0434   CREATININE 2.69* 07/31/2011 0434   CREATININE 1.03 07/01/2011 1453   CALCIUM 8.7 07/31/2011 0434   GFRNONAA 18* 07/31/2011 0434   GFRAA 21* 07/31/2011 0434     HEME/ID Tm 99.5 now 98.2 . CBC    Component Value Date/Time   WBC 17.9* 07/31/2011 0434   RBC 3.73* 07/31/2011 0434   HGB 10.6* 07/31/2011 0434   HCT 32.9* 07/31/2011 0434   PLT 109* 07/31/2011 0434   MCV 88.2 07/31/2011 0434   MCH 28.4 07/31/2011 0434   MCHC 32.2 07/31/2011 0434   RDW 17.0* 07/31/2011 0434   LYMPHSABS 1.3 12/31/2010 1301   MONOABS 0.6 12/31/2010 1301   EOSABS 0.0 12/31/2010 1301   BASOSABS 0.0 12/31/2010 1301    Blood Culture No results found for this basename: sdes, specrequest, cult, reptstatus    ABDOMEN:  Soft NT/ND occasional BS  EXTREMITIES:  Warm and well perfused.  A/P: 66 y.o. female who is s/p AAA repair  POD 2  -acute surgical blood loss anemia-tolerating -still requiring Nipride for BP-wean as tolerated -BUN/Cr worsening from yesterday, however, UOP is improved from yesterday -WBC improving -IS 10 x / hr while awake -check labs in am -keep NGT as it had 600 cc out. -continue NPO today -d/c swan sleeve  [name], PA-C [facility] [phone]  History and exam details as  above Urine output adequate, has some element of acute tubular necrosis, hopefully creatinine will begin trending down soon Will probably need to start diuresis tomorrow Leave NG tube until flatus or BM Will need better BP control but will need to continue Nipride for now until she can take PO Need to keep foley for now to follow urine output until renal function stabilizes OK to d/c aline  [name], MD [facility] [place] Office: [phone] Pager: [phone]

## 2011-07-31 NOTE — Progress Notes (Signed)
Physical Therapy Treatment Patient Details Name: Lori York MRN: [id number] DOB: 02-20-1945 Today's Date: 07/31/2011 Time: 4098-1191 PT Time Calculation (min): 33 min  PT Assessment / Plan / Recommendation Comments on Treatment Session  Patient s/p AAA repair with decr mobility secondary to surgical pain and decr endurance.  Will benefit from continued PT to address balance, endurance and mobility.      Follow Up Recommendations  Home health PT;Supervision/Assistance - 24 hour    Barriers to Discharge  None      Equipment Recommendations  None recommended by PT    Recommendations for Other Services  None  Frequency Min 3X/week   Plan Discharge plan remains appropriate;Frequency remains appropriate    Precautions / Restrictions Precautions Precautions: Fall Restrictions Weight Bearing Restrictions: No   Pertinent Vitals/Pain desats on RA with activity to 88%, kept patient on 2L for ambulation, 8/10 pain abdomen    Mobility  Bed Mobility Bed Mobility: Sit to Sidelying Left;Sit to Supine Rolling Left: Not tested (comment) Left Sidelying to Sit: Not tested (comment) Sitting - Scoot to Edge of Bed: Not tested (comment) Sit to Supine: 4: Min assist Sit to Sidelying Left: 4: Min assist Details for Bed Mobility Assistance: min cues for technique, sequencing and hand placement Transfers Transfers: Sit to Stand;Stand to Sit Sit to Stand: 1: +2 Total assist;With upper extremity assist;From chair/3-in-1;With armrests Sit to Stand: Patient Percentage: 70% Stand to Sit: 4: Min assist Stand Pivot Transfers: Not tested (comment) Details for Transfer Assistance: verbal cues needed for hand placement Ambulation/Gait Ambulation/Gait Assistance: 1: +2 Total assist Ambulation/Gait: Patient Percentage: 70% Ambulation Distance (Feet): 550 Feet Assistive device: Rolling walker Ambulation/Gait Assistance Details: Patient with narrow BOS at times and leaning to left needing  steadying assist at times.  Patient c/o numbness left LE throughout initial treatment.  The further patient ambulated, the steadier she became.  Patiient needed cues and assist to steer RW much of the time as she steered into objects unless assisted and cued.   Gait Pattern: Step-through pattern;Decreased stride length;Decreased weight shift to right;Ataxic;Trunk flexed Gait velocity: decreased initially but patient picked up speed the longer she ambulated.   Stairs: No Wheelchair Mobility Wheelchair Mobility: No    PT Goals Acute Rehab PT Goals PT Goal: Sit at Edge Of Bed - Progress: Progressing toward goal PT Goal: Sit to Stand - Progress: Progressing toward goal PT Goal: Stand to Sit - Progress: Progressing toward goal PT Goal: Ambulate - Progress: Progressing toward goal  Visit Information  Last PT Received On: 07/31/11 Assistance Needed: +2 PT/OT Co-Evaluation/Treatment: Yes    Subjective Data  Subjective: "I want to keep walking."   Cognition  Overall Cognitive Status: Impaired Area of Impairment: Following commands;Safety/judgement Arousal/Alertness: Awake/alert Orientation Level: Disoriented to;Time Behavior During Session: [facility] for tasks performed Following Commands: Follows multi-step commands with increased time Safety/Judgement: Decreased awareness of safety precautions;Decreased safety judgement for tasks assessed    Balance  Static Sitting Balance Static Sitting - Balance Support: Bilateral upper extremity supported;Feet supported Static Sitting - Level of Assistance: 5: Stand by assistance Static Sitting - Comment/# of Minutes: 2 minutes with patient able to sit independently.  End of Session PT - End of Session Equipment Utilized During Treatment: Gait belt Activity Tolerance: Patient tolerated treatment well Patient left: in bed;with call bell/phone within reach;with nursing in room Nurse Communication: Mobility status    INGOLD,Briant Angelillo 07/31/2011, 10:12  AM  [facility] [phone] (pager)

## 2011-08-01 LAB — GLUCOSE, CAPILLARY
Glucose-Capillary: 112 mg/dL — ABNORMAL HIGH (ref 70–99)
Glucose-Capillary: 130 mg/dL — ABNORMAL HIGH (ref 70–99)
Glucose-Capillary: 131 mg/dL — ABNORMAL HIGH (ref 70–99)
Glucose-Capillary: 135 mg/dL — ABNORMAL HIGH (ref 70–99)

## 2011-08-01 LAB — CBC
Hemoglobin: 9.2 g/dL — ABNORMAL LOW (ref 12.0–15.0)
MCH: 28.6 pg (ref 26.0–34.0)
MCV: 87.6 fL (ref 78.0–100.0)
Platelets: 94 10*3/uL — ABNORMAL LOW (ref 150–400)
RBC: 3.22 MIL/uL — ABNORMAL LOW (ref 3.87–5.11)
WBC: 14.6 10*3/uL — ABNORMAL HIGH (ref 4.0–10.5)

## 2011-08-01 LAB — BASIC METABOLIC PANEL
CO2: 23 mEq/L (ref 19–32)
Calcium: 9 mg/dL (ref 8.4–10.5)
GFR calc non Af Amer: 19 mL/min — ABNORMAL LOW (ref >90)
Glucose, Bld: 130 mg/dL — ABNORMAL HIGH (ref 70–99)
Potassium: 4.5 mEq/L (ref 3.5–5.1)
Sodium: 133 mEq/L — ABNORMAL LOW (ref 135–145)

## 2011-08-01 MED ORDER — POTASSIUM CHLORIDE 10 MEQ/100ML IV SOLN
10.0000 meq | INTRAVENOUS | Status: AC
  Administered 2011-08-01 (×2): 10 meq via INTRAVENOUS
  Filled 2011-08-01: qty 200

## 2011-08-01 MED ORDER — FUROSEMIDE 10 MG/ML IJ SOLN
40.0000 mg | Freq: Once | INTRAMUSCULAR | Status: AC
  Administered 2011-08-01: 40 mg via INTRAVENOUS
  Filled 2011-08-01: qty 4

## 2011-08-01 NOTE — Progress Notes (Signed)
Physical Therapy Treatment Patient Details Name: Lori York MRN: [id number] DOB: 1945-04-28 Today's Date: 08/01/2011 Time: 1028-1059 PT Time Calculation (min): 31 min  PT Assessment / Plan / Recommendation Comments on Treatment Session  Patient s/p AAA repair with decr mobility secondary to surgical pain and decr endurance.  Patient more fatigued today as well.  Still progressing but patient just not feeling as well and she also desat with activity.  Encouraged pursed lip breathing and incentive spirometer.      Follow Up Recommendations  Home health PT;Supervision/Assistance - 24 hour    Barriers to Discharge  None      Equipment Recommendations  None recommended by PT    Recommendations for Other Services  None  Frequency Min 3X/week   Plan Discharge plan remains appropriate;Frequency remains appropriate    Precautions / Restrictions Precautions Precautions: Fall Restrictions Weight Bearing Restrictions: No   Pertinent Vitals/Pain desat to 87% on RA with ambulation, improved to 90% with pursed lip breathing; nursing put pt back on 3L at end of walk, no pain    Mobility  Bed Mobility Bed Mobility: Sit to Supine Rolling Left: Not tested (comment) Left Sidelying to Sit: Not tested (comment) Sitting - Scoot to Edge of Bed: Not tested (comment) Sit to Supine: 4: Min assist Sit to Sidelying Left: Not Tested (comment) Details for Bed Mobility Assistance: needed min assist to move LEs onto bed.  cues for technique, sequencing and hand placement Transfers Transfers: Sit to Stand;Stand to Sit Sit to Stand: 3: Mod assist;With upper extremity assist;With armrests;From chair/3-in-1 Stand to Sit: 3: Mod assist;With upper extremity assist;With armrests;To bed Stand Pivot Transfers: Not tested (comment) Details for Transfer Assistance: verbal cues for hand placement Ambulation/Gait Ambulation/Gait Assistance: 4: Min assist Ambulation Distance (Feet): 300 Feet Assistive  device: Rolling walker Ambulation/Gait Assistance Details: Patient with improved posture today with ambulation.  Increased steadiness as well.  Continues to c/o numbness left LE at times when ambulating.  Patient had better control of RW today as well.   Gait Pattern: Step-through pattern;Decreased stride length;Decreased weight shift to right;Ataxic;Trunk flexed Gait velocity: patient's speed decr today secondary to patient stated she has taken a lot of meds Stairs: No Wheelchair Mobility Wheelchair Mobility: No     PT Goals Acute Rehab PT Goals PT Goal: Sit at Edge Of Bed - Progress: Progressing toward goal PT Goal: Sit to Stand - Progress: Progressing toward goal PT Goal: Stand to Sit - Progress: Progressing toward goal PT Transfer Goal: Bed to Chair/Chair to Bed - Progress: Progressing toward goal PT Goal: Ambulate - Progress: Progressing toward goal  Visit Information  Last PT Received On: 08/01/11 Assistance Needed: +1    Subjective Data  Subjective: "I have to quit all these drugs they are giving me.  They are messing me up."   Cognition  Overall Cognitive Status: Impaired Area of Impairment: Following commands;Safety/judgement Arousal/Alertness: Awake/alert Orientation Level: Disoriented to;Time Behavior During Session: [facility] for tasks performed Following Commands: Follows multi-step commands with increased time Safety/Judgement: Decreased awareness of safety precautions;Decreased safety judgement for tasks assessed    Balance  Static Sitting Balance Static Sitting - Level of Assistance: Not tested (comment)  End of Session PT - End of Session Equipment Utilized During Treatment: Gait belt Activity Tolerance: Patient tolerated treatment well;Patient limited by fatigue Patient left: in bed;with call bell/phone within reach;with nursing in room Nurse Communication: Mobility status    Lori York 08/01/2011, 12:02 PM  [facility] Acute  Rehabilitation [phone] (pager)

## 2011-08-01 NOTE — Progress Notes (Signed)
[facility] [place]  Subjective  - POD #3 AAA repair, complains of tingling in feet  Objective 169/93 99 98.5 F (36.9 C) (Oral) 23 96%  Intake/Output Summary (Last 24 hours) at 08/01/11 0830 Last data filed at 08/01/11 0700  Gross per 24 hour  Intake 3323.4 ml  Output   1710 ml  Net 1613.4 ml   Abdomen soft, some minimal bloody drainage yesterday NG still bilious Feet pink and warm 1-2+ DP pulses  Assessment/Planning: Hypertension-still requiring Nipride, will need to keep in stepdown or ICU until she can take PO BP meds ATN- creatinine improving start diuresis today AAA- still with ileus keep NG for now  Lori York 08/01/2011 8:30 AM --  Laboratory Lab Results:  Basename 08/01/11 0407 07/31/11 0434  WBC 14.6* 17.9*  HGB 9.2* 10.6*  HCT 28.2* 32.9*  PLT 94* 109*   BMET  Basename 08/01/11 0407 07/31/11 0434  NA 133* 136  K 4.5 4.3  CL 103 104  CO2 23 23  GLUCOSE 130* 147*  BUN 37* 40*  CREATININE 2.49* 2.69*  CALCIUM 9.0 8.7

## 2011-08-02 LAB — CBC
Platelets: 155 10*3/uL (ref 150–400)
RBC: 3.45 MIL/uL — ABNORMAL LOW (ref 3.87–5.11)
RDW: 16.3 % — ABNORMAL HIGH (ref 11.5–15.5)
WBC: 12 10*3/uL — ABNORMAL HIGH (ref 4.0–10.5)

## 2011-08-02 LAB — GLUCOSE, CAPILLARY
Glucose-Capillary: 114 mg/dL — ABNORMAL HIGH (ref 70–99)
Glucose-Capillary: 117 mg/dL — ABNORMAL HIGH (ref 70–99)
Glucose-Capillary: 126 mg/dL — ABNORMAL HIGH (ref 70–99)
Glucose-Capillary: 145 mg/dL — ABNORMAL HIGH (ref 70–99)

## 2011-08-02 LAB — BASIC METABOLIC PANEL
CO2: 24 mEq/L (ref 19–32)
Chloride: 103 mEq/L (ref 96–112)
GFR calc Af Amer: 27 mL/min — ABNORMAL LOW (ref >90)
Potassium: 4.6 mEq/L (ref 3.5–5.1)

## 2011-08-02 MED ORDER — POTASSIUM CHLORIDE 10 MEQ/100ML IV SOLN
10.0000 meq | INTRAVENOUS | Status: AC
  Administered 2011-08-02 (×2): 10 meq via INTRAVENOUS
  Filled 2011-08-02: qty 200

## 2011-08-02 MED ORDER — FUROSEMIDE 10 MG/ML IJ SOLN
40.0000 mg | Freq: Once | INTRAMUSCULAR | Status: AC
  Administered 2011-08-02: 40 mg via INTRAVENOUS
  Filled 2011-08-02: qty 4

## 2011-08-02 MED ORDER — OXYCODONE HCL 5 MG PO TABS: 5.0000 mg | ORAL_TABLET | Freq: Four times a day (QID) | ORAL | Status: AC | PRN

## 2011-08-02 MED ORDER — METOPROLOL TARTRATE 1 MG/ML IV SOLN
INTRAVENOUS | Status: AC
  Administered 2011-08-02: 5 mg
  Filled 2011-08-02: qty 5

## 2011-08-02 NOTE — Progress Notes (Addendum)
[facility] Progress Note  08/02/2011 8:32 AM POD 4  Subjective:  Sleepy this morning  Tm 99 now 98.7  Still requiring Nipride Filed Vitals:   08/02/11 0815  BP: 157/83  Pulse: 82  Temp:   Resp: 18     Physical Exam: Cardiac:  RRR Lungs:  CTAB Abdomen:  Soft NT/ND still with minimal BS deinies BM Incisions:  C/d/i with staples in tact Extremities:  Warm and well perfused.  CBC    Component Value Date/Time   WBC 12.0* 08/02/2011 0650   RBC 3.45* 08/02/2011 0650   HGB 9.9* 08/02/2011 0650   HCT 29.9* 08/02/2011 0650   PLT 155 08/02/2011 0650   MCV 86.7 08/02/2011 0650   MCH 28.7 08/02/2011 0650   MCHC 33.1 08/02/2011 0650   RDW 16.3* 08/02/2011 0650   LYMPHSABS 1.3 12/31/2010 1301   MONOABS 0.6 12/31/2010 1301   EOSABS 0.0 12/31/2010 1301   BASOSABS 0.0 12/31/2010 1301    BMET    Component Value Date/Time   NA 133* 08/01/2011 0407   K 4.5 08/01/2011 0407   CL 103 08/01/2011 0407   CO2 23 08/01/2011 0407   GLUCOSE 130* 08/01/2011 0407   BUN 37* 08/01/2011 0407   CREATININE 2.49* 08/01/2011 0407   CREATININE 1.03 07/01/2011 1453   CALCIUM 9.0 08/01/2011 0407   GFRNONAA 19* 08/01/2011 0407   GFRAA 23* 08/01/2011 0407    INR    Component Value Date/Time   INR 1.41 07/29/2011 1415     Intake/Output Summary (Last 24 hours) at 08/02/11 0832 Last data filed at 08/02/11 0800  Gross per 24 hour  Intake 3228.82 ml  Output   4775 ml  Net -1546.18 ml     Assessment/Plan:  66 y.o. female is s/p AAA  POD 4  -BMP still pending for today.  As of yesterday, renal function was improving.  Possibly decrease fluids if renal function continues to improve -good UOP with over 4L out past 24 hours. -acute surgical blood loss anemia improving -WBC improving -NGT with 500 cc out-keep NGT -continue with Nipride until pt able to take po BP medication -? Start lovenox for DVT prophylaxis-will d/w Dr. Darrick Penna. -continue OOB and mobilization -continue IS every hr while  awake  [name], PA-C [facility] [phone] 08/02/2011 8:32 AM   Overall slowly improving Cr 2.1 Decrease IV fluids, ATN continues to resolve diurese today Keep in ICU until she can take PO BP meds  [name], MD [facility] [place] Office: [phone] Pager: [phone]  BMET    Component Value Date/Time   NA 136 08/02/2011 0650   K 4.6 08/02/2011 0650   CL 103 08/02/2011 0650   CO2 24 08/02/2011 0650   GLUCOSE 137* 08/02/2011 0650   BUN 32* 08/02/2011 0650   CREATININE 2.12* 08/02/2011 0650   CREATININE 1.03 07/01/2011 1453   CALCIUM 9.8 08/02/2011 0650   GFRNONAA 24* 08/02/2011 0650   GFRAA 27* 08/02/2011 5784

## 2011-08-02 NOTE — Progress Notes (Signed)
Occupational Therapy Treatment Patient Details Name: Lori York MRN: [id number] DOB: 04-23-1945 Today's Date: 08/02/2011 Time: 7829-5621 OT Time Calculation (min): 22 min  OT Assessment / Plan / Recommendation Comments on Treatment Session Pt performed well today despite fatigue. Con't to recommend HHOT with 24/7 supervision.    Follow Up Recommendations  Home health OT;Supervision/Assistance - 24 hour    Barriers to Discharge       Equipment Recommendations  None recommended by OT    Recommendations for Other Services    Frequency Min 2X/week   Plan Discharge plan remains appropriate    Precautions / Restrictions Precautions Precautions: Fall Restrictions Weight Bearing Restrictions: No   Pertinent Vitals/Pain     ADL  Grooming: Teeth care;Performed;Min guard Where Assessed - Grooming: Supported standing Lower Body Dressing: Performed;Minimal assistance Where Assessed - Lower Body Dressing: Unsupported sitting ADL Comments: Pt was able to don/doff both socks and stood to perfom teeth care. Continues to fatigue quickly.    OT Diagnosis:    OT Problem List:   OT Treatment Interventions:     OT Goals ADL Goals ADL Goal: Grooming - Progress: Progressing toward goals ADL Goal: Lower Body Dressing - Progress: Progressing toward goals ADL Goal: Toilet Transfer - Progress: Progressing toward goals ADL Goal: Additional Goal #1 - Progress: Progressing toward goals  Visit Information  Last OT Received On: 07/26/11 Assistance Needed: +1    Subjective Data  Subjective: I didnt feel too much like walking today.   Prior Functioning       Cognition  Overall Cognitive Status: Appears within functional limits for tasks assessed/performed Area of Impairment: Safety/judgement Arousal/Alertness: Awake/alert Orientation Level: Appears intact for tasks assessed Behavior During Session: [facility] for tasks performed Following Commands: Follows multi-step commands  consistently Safety/Judgement: Decreased awareness of safety precautions Safety/Judgement - Other Comments: Cognition has improved.  Safety is improving as well.     Mobility Bed Mobility Bed Mobility: Sit to Supine Rolling Left: Not tested (comment) Left Sidelying to Sit: Not tested (comment) Sitting - Scoot to Edge of Bed: Not tested (comment) Sit to Supine: 4: Min guard Sit to Sidelying Left: Not Tested (comment) Details for Bed Mobility Assistance: Patient able to move LEs into bed without assist and only cues for technique today.   Transfers Sit to Stand: 4: Min assist;With upper extremity assist;With armrests;From chair/3-in-1 Stand to Sit: 4: Min assist;With upper extremity assist;To chair/3-in-1 Details for Transfer Assistance: verbal cues for hand placement   Exercises    Balance Static Sitting Balance Static Sitting - Level of Assistance: Not tested (comment)  End of Session OT - End of Session Activity Tolerance: Patient limited by fatigue Patient left: in chair;with call bell/phone within reach   Qais Jowers A OTR/L [phone] 08/02/2011, 12:17 PM

## 2011-08-02 NOTE — Progress Notes (Signed)
Physical Therapy Treatment Patient Details Name: Lori York MRN: [id number] DOB: [birth year] Today's Date: 08/02/2011 Time: 7829-5621 PT Time Calculation (min): 23 min  PT Assessment / Plan / Recommendation Comments on Treatment Session  Patient s/p AAA repair with decr mobiltiy secondary to surgical pain and decr endurance.  Patient did not desat with activity with pursed lip breathing on RA today.  Improving daily.      Follow Up Recommendations  Home health PT;Supervision/Assistance - 24 hour    Barriers to Discharge  None      Equipment Recommendations  None recommended by PT    Recommendations for Other Services  None  Frequency Min 3X/week   Plan Discharge plan remains appropriate;Frequency remains appropriate    Precautions / Restrictions Precautions Precautions: Fall Restrictions Weight Bearing Restrictions: No   Pertinent Vitals/Pain VSS, Some pain    Mobility  Bed Mobility Bed Mobility: Sit to Supine Rolling Left: Not tested (comment) Left Sidelying to Sit: Not tested (comment) Sitting - Scoot to Edge of Bed: Not tested (comment) Sit to Supine: 4: Min guard Sit to Sidelying Left: Not Tested (comment) Details for Bed Mobility Assistance: Patient able to move LEs into bed without assist and only cues for technique today.   Transfers Transfers: Sit to Stand;Stand to [employer] Transfers Sit to Stand: 4: Min assist;With upper extremity assist;With armrests;From chair/3-in-1 Stand to Sit: 4: Min assist;With upper extremity assist;With armrests;To bed Stand Pivot Transfers: Not tested (comment) Details for Transfer Assistance: verbal cues for hand placement Ambulation/Gait Ambulation/Gait Assistance: 4: Min guard Ambulation Distance (Feet): 225 Feet Assistive device: Rolling walker Ambulation/Gait Assistance Details: Patient continues with improving steadiness and safety with ambulation daily.  Patient did not want to ambulate as far stating,"I think  I overdid it yesterday." Gait Pattern: Step-through pattern;Decreased stride length;Decreased weight shift to right;Trunk flexed Stairs: No Wheelchair Mobility Wheelchair Mobility: No     PT Goals Acute Rehab PT Goals PT Goal: Sit to Stand - Progress: Progressing toward goal PT Goal: Stand to Sit - Progress: Progressing toward goal PT Goal: Ambulate - Progress: Progressing toward goal  Visit Information  Last PT Received On: 08/02/11 Assistance Needed: +1    Subjective Data  Subjective: "I want to walk."   Cognition  Overall Cognitive Status: Appears within functional limits for tasks assessed/performed Area of Impairment: Safety/judgement Arousal/Alertness: Awake/alert Orientation Level: Appears intact for tasks assessed Behavior During Session: [facility] for tasks performed Following Commands: Follows multi-step commands consistently Safety/Judgement: Decreased awareness of safety precautions Safety/Judgement - Other Comments: Cognition has improved.  Safety is improving as well.     Balance  Static Sitting Balance Static Sitting - Level of Assistance: Not tested (comment)  End of Session PT - End of Session Equipment Utilized During Treatment: Gait belt Activity Tolerance: Patient tolerated treatment well Patient left: in bed;with call bell/phone within reach Nurse Communication: Mobility status    INGOLD,Cincere Zorn 08/02/2011, 12:09 PM  [facility] Acute Rehabilitation [phone] (pager)

## 2011-08-03 LAB — GLUCOSE, CAPILLARY
Glucose-Capillary: 107 mg/dL — ABNORMAL HIGH (ref 70–99)
Glucose-Capillary: 112 mg/dL — ABNORMAL HIGH (ref 70–99)
Glucose-Capillary: 115 mg/dL — ABNORMAL HIGH (ref 70–99)
Glucose-Capillary: 117 mg/dL — ABNORMAL HIGH (ref 70–99)

## 2011-08-03 MED ORDER — FUROSEMIDE 8 MG/ML PO SOLN
40.0000 mg | Freq: Once | ORAL | Status: AC
  Administered 2011-08-03: 40 mg via ORAL
  Filled 2011-08-03: qty 5

## 2011-08-03 NOTE — Progress Notes (Signed)
[facility] [place]  Subjective  -   Feels better this am. Positive flatus   Physical Exam:  Abdomen soft with clean incision Extremities warm       Assessment/Plan:  S/p AAA  Acute renal insufficiency:  Cr continues to decrease.  D/c foley Ileus resolved.  D/c foley, keep NPO  Aristides Luckey IV, V. WELLS 08/03/2011 8:51 AM --  Filed Vitals:   08/03/11 0700  BP: 161/85  Pulse: 87  Temp:   Resp: 14    Intake/Output Summary (Last 24 hours) at 08/03/11 0851 Last data filed at 08/03/11 0600  Gross per 24 hour  Intake 1753.85 ml  Output   3900 ml  Net -2146.15 ml     Laboratory CBC    Component Value Date/Time   WBC 12.0* 08/02/2011 0650   HGB 9.9* 08/02/2011 0650   HCT 29.9* 08/02/2011 0650   PLT 155 08/02/2011 0650    BMET    Component Value Date/Time   NA 136 08/02/2011 0650   K 4.6 08/02/2011 0650   CL 103 08/02/2011 0650   CO2 24 08/02/2011 0650   GLUCOSE 137* 08/02/2011 0650   BUN 32* 08/02/2011 0650   CREATININE 2.12* 08/02/2011 0650   CREATININE 1.03 07/01/2011 1453   CALCIUM 9.8 08/02/2011 0650   GFRNONAA 24* 08/02/2011 0650   GFRAA 27* 08/02/2011 0650    COAG Lab Results  Component Value Date   INR 1.41 07/29/2011   INR 1.06 07/25/2011   INR 1.65* 03/07/2011   No results found for this basename: PTT    Antibiotics Anti-infectives     Start     Dose/Rate Route Frequency Ordered Stop   07/29/11 1945   vancomycin (VANCOCIN) IVPB 1000 mg/200 mL premix        1,000 mg 200 mL/hr over 60 Minutes Intravenous Every 12 hours 07/29/11 1404 07/30/11 0856   07/28/11 1450   vancomycin (VANCOCIN) IVPB 1000 mg/200 mL premix        1,000 mg 200 mL/hr over 60 Minutes Intravenous 60 min pre-op 07/28/11 1450 07/29/11 0748           V. Charlena Cross, M.D. [facility] [place] Office: [phone] Pager:  [phone]

## 2011-08-04 LAB — GLUCOSE, CAPILLARY
Glucose-Capillary: 103 mg/dL — ABNORMAL HIGH (ref 70–99)
Glucose-Capillary: 102 mg/dL — ABNORMAL HIGH (ref 70–99)
Glucose-Capillary: 103 mg/dL — ABNORMAL HIGH (ref 70–99)

## 2011-08-04 LAB — CBC
HCT: 27.9 % — ABNORMAL LOW (ref 36.0–46.0)
MCH: 29.1 pg (ref 26.0–34.0)
MCV: 87.2 fL (ref 78.0–100.0)
RDW: 16 % — ABNORMAL HIGH (ref 11.5–15.5)
WBC: 8 10*3/uL (ref 4.0–10.5)

## 2011-08-04 LAB — BASIC METABOLIC PANEL
BUN: 30 mg/dL — ABNORMAL HIGH (ref 6–23)
CO2: 26 mEq/L (ref 19–32)
Chloride: 100 mEq/L (ref 96–112)
Creatinine, Ser: 1.81 mg/dL — ABNORMAL HIGH (ref 0.50–1.10)
GFR calc Af Amer: 33 mL/min — ABNORMAL LOW (ref >90)

## 2011-08-04 MED ORDER — ZOLPIDEM TARTRATE 5 MG PO TABS: 10.0000 mg | ORAL_TABLET | Freq: Every evening | ORAL | Status: DC | PRN

## 2011-08-04 MED ORDER — POTASSIUM CHLORIDE CRYS ER 20 MEQ PO TBCR
20.0000 meq | EXTENDED_RELEASE_TABLET | Freq: Every day | ORAL | Status: DC
  Administered 2011-08-04 – 2011-08-05 (×2): 20 meq via ORAL
  Filled 2011-08-04 (×2): qty 1

## 2011-08-04 MED ORDER — ATORVASTATIN CALCIUM 20 MG PO TABS
20.0000 mg | ORAL_TABLET | Freq: Every day | ORAL | Status: DC
  Administered 2011-08-04: 20 mg via ORAL
  Filled 2011-08-04 (×2): qty 1

## 2011-08-04 MED ORDER — ASPIRIN EC 325 MG PO TBEC
325.0000 mg | DELAYED_RELEASE_TABLET | Freq: Every day | ORAL | Status: DC
  Administered 2011-08-04 – 2011-08-05 (×2): 325 mg via ORAL
  Filled 2011-08-04 (×2): qty 1

## 2011-08-04 MED ORDER — PANTOPRAZOLE SODIUM 40 MG PO TBEC
40.0000 mg | DELAYED_RELEASE_TABLET | Freq: Every day | ORAL | Status: DC
  Administered 2011-08-04 – 2011-08-05 (×2): 40 mg via ORAL
  Filled 2011-08-04 (×2): qty 1

## 2011-08-04 MED ORDER — PAROXETINE HCL 20 MG PO TABS
20.0000 mg | ORAL_TABLET | Freq: Every day | ORAL | Status: DC
  Administered 2011-08-04 – 2011-08-05 (×2): 20 mg via ORAL
  Filled 2011-08-04 (×2): qty 1

## 2011-08-04 MED ORDER — FUROSEMIDE 40 MG PO TABS
40.0000 mg | ORAL_TABLET | Freq: Two times a day (BID) | ORAL | Status: DC
  Administered 2011-08-04 – 2011-08-05 (×3): 40 mg via ORAL
  Filled 2011-08-04 (×6): qty 1

## 2011-08-04 MED ORDER — CARVEDILOL 25 MG PO TABS
25.0000 mg | ORAL_TABLET | Freq: Two times a day (BID) | ORAL | Status: DC
  Administered 2011-08-04 – 2011-08-05 (×3): 25 mg via ORAL
  Filled 2011-08-04 (×5): qty 1

## 2011-08-04 MED ORDER — HYDRALAZINE HCL 50 MG PO TABS
50.0000 mg | ORAL_TABLET | Freq: Three times a day (TID) | ORAL | Status: DC
  Administered 2011-08-04 – 2011-08-05 (×4): 50 mg via ORAL
  Filled 2011-08-04 (×6): qty 1

## 2011-08-04 MED ORDER — NITROPRUSSIDE SODIUM 25 MG/ML IV SOLN
0.2500 ug/kg/min | INTRAVENOUS | Status: DC
  Filled 2011-08-04: qty 2

## 2011-08-04 NOTE — Progress Notes (Signed)
Patient was transferred from [address] to [address], after report was given to receiving RN.  Patient's chart, medications, and personal belongings were all sent with patient.    Keitha Butte, RN

## 2011-08-04 NOTE — Progress Notes (Signed)
[facility] [place]  Subjective  - No complaints this am. Continues to have flatus  Physical Exam:  Abdomen soft Palpable pedal pulses       Assessment/Plan:  S/p AAA  con't to wean off nipride Start clears  Noelle Sease IV, V. WELLS 08/04/2011 8:08 AM --  Filed Vitals:   08/04/11 0728  BP:   Pulse:   Temp: 98.2 F (36.8 C)  Resp:     Intake/Output Summary (Last 24 hours) at 08/04/11 0808 Last data filed at 08/04/11 0600  Gross per 24 hour  Intake   1218 ml  Output   2225 ml  Net  -1007 ml     Laboratory CBC    Component Value Date/Time   WBC 8.0 08/04/2011 0328   HGB 9.3* 08/04/2011 0328   HCT 27.9* 08/04/2011 0328   PLT 196 08/04/2011 0328    BMET    Component Value Date/Time   NA 136 08/04/2011 0328   K 4.6 08/04/2011 0328   CL 100 08/04/2011 0328   CO2 26 08/04/2011 0328   GLUCOSE 102* 08/04/2011 0328   BUN 30* 08/04/2011 0328   CREATININE 1.81* 08/04/2011 0328   CREATININE 1.03 07/01/2011 1453   CALCIUM 9.5 08/04/2011 0328   GFRNONAA 29* 08/04/2011 0328   GFRAA 33* 08/04/2011 0328    COAG Lab Results  Component Value Date   INR 1.41 07/29/2011   INR 1.06 07/25/2011   INR 1.65* 03/07/2011   No results found for this basename: PTT    Antibiotics Anti-infectives     Start     Dose/Rate Route Frequency Ordered Stop   07/29/11 1945   vancomycin (VANCOCIN) IVPB 1000 mg/200 mL premix        1,000 mg 200 mL/hr over 60 Minutes Intravenous Every 12 hours 07/29/11 1404 07/30/11 0856   07/28/11 1450   vancomycin (VANCOCIN) IVPB 1000 mg/200 mL premix        1,000 mg 200 mL/hr over 60 Minutes Intravenous 60 min pre-op 07/28/11 1450 07/29/11 0748           V. Charlena Cross, M.D. [facility] [place] Office: [phone] Pager:  [phone]

## 2011-08-05 LAB — BASIC METABOLIC PANEL
Chloride: 99 mEq/L (ref 96–112)
Creatinine, Ser: 1.96 mg/dL — ABNORMAL HIGH (ref 0.50–1.10)
GFR calc Af Amer: 30 mL/min — ABNORMAL LOW (ref >90)
Potassium: 4 mEq/L (ref 3.5–5.1)

## 2011-08-05 LAB — CBC
MCV: 89.3 fL (ref 78.0–100.0)
Platelets: 263 10*3/uL (ref 150–400)
RDW: 15.6 % — ABNORMAL HIGH (ref 11.5–15.5)
WBC: 8.6 10*3/uL (ref 4.0–10.5)

## 2011-08-05 LAB — GLUCOSE, CAPILLARY: Glucose-Capillary: 87 mg/dL (ref 70–99)

## 2011-08-05 NOTE — Progress Notes (Signed)
[facility] [place]  Subjective  - Eating ok, bowel function returning  Objective 153/88 71 98.4 F (36.9 C) (Oral) 18 96%  Intake/Output Summary (Last 24 hours) at 08/05/11 0810 Last data filed at 08/05/11 0757  Gross per 24 hour  Intake 1491.55 ml  Output   2475 ml  Net -983.45 ml   Abdomen- soft appropriate tenderness, incision intact  Assessment/Planning: S/p AAA ileus resolved ATN resolving D/c home  Lori York E 08/05/2011 8:10 AM --  Laboratory Lab Results:  [facility] 08/05/11 0620 08/04/11 0328  WBC 8.6 8.0  HGB 10.4* 9.3*  HCT 31.7* 27.9*  PLT 263 196   BMET  Basename 08/05/11 0620 08/04/11 0328  NA 138 136  K 4.0 4.6  CL 99 100  CO2 29 26  GLUCOSE 105* 102*  BUN 27* 30*  CREATININE 1.96* 1.81*  CALCIUM 9.8 9.5    COAG Lab Results  Component Value Date   INR 1.41 07/29/2011   INR 1.06 07/25/2011   INR 1.65* 03/07/2011   No results found for this basename: PTT    Antibiotics Anti-infectives     Start     Dose/Rate Route Frequency Ordered Stop   07/29/11 1945   vancomycin (VANCOCIN) IVPB 1000 mg/200 mL premix        1,000 mg 200 mL/hr over 60 Minutes Intravenous Every 12 hours 07/29/11 1404 07/30/11 0856   07/28/11 1450   vancomycin (VANCOCIN) IVPB 1000 mg/200 mL premix        1,000 mg 200 mL/hr over 60 Minutes Intravenous 60 min pre-op 07/28/11 1450 07/29/11 0748

## 2011-08-05 NOTE — Progress Notes (Signed)
Pt given discharge instructions, medication lists, follow up appointments, when to call the doctor and s/sx infection. Pt verbalized understanding of instructions. Thomas Hoff

## 2011-08-05 NOTE — Progress Notes (Signed)
Occupational Therapy Treatment Patient Details Name: Lori York MRN: [id number] DOB: Dec 31, 1945 Today's Date: 08/05/2011 Time: 4098-1191 OT Time Calculation (min): 18 min  OT Assessment / Plan / Recommendation Comments on Treatment Session Pt making steady progress towards goals. D/C planned for today with HHOT    Follow Up Recommendations  Home health OT    Barriers to Discharge       Equipment Recommendations  None recommended by OT    Recommendations for Other Services    Frequency Min 2X/week   Plan Discharge plan remains appropriate    Precautions / Restrictions Precautions Precautions: Fall Restrictions Weight Bearing Restrictions: No   Pertinent Vitals/Pain     ADL  Eating/Feeding: Performed Upper Body Bathing: Performed;Supervision/safety Where Assessed - Upper Body Bathing: Unsupported sitting Lower Body Bathing: Performed;Supervision/safety Where Assessed - Lower Body Bathing: Unsupported sit to stand Upper Body Dressing: Performed;Supervision/safety Where Assessed - Upper Body Dressing: Unsupported standing Lower Body Dressing: Performed;Supervision/safety Toilet Transfer: Research scientist (life sciences) Method: Sit to Barista: Materials engineer and Hygiene: Performed;Supervision/safety Where Assessed - Engineer, mining and Hygiene: Sit to stand from 3-in-1 or toilet Transfers/Ambulation Related to ADLs: Pt ambulated around the room and was able to retrieve clothing and ADL items with supervision.    OT Diagnosis:    OT Problem List:   OT Treatment Interventions:     OT Goals ADL Goals Pt Will Perform Grooming: with modified independence;Standing at sink ADL Goal: Grooming - Progress: Updated due to goal met Pt Will Perform Upper Body Dressing: with modified independence;Sitting, bed;Sitting, chair;Unsupported ADL Goal: Upper Body Dressing - Progress: Updated  due to goal met Pt Will Perform Lower Body Dressing: with modified independence;Sit to stand from chair;Sit to stand from bed ADL Goal: Lower Body Dressing - Progress: Updated due to goal met Pt Will Transfer to Toilet: with modified independence;Ambulation;Regular height toilet ADL Goal: Toilet Transfer - Progress: Updated due to goal met ADL Goal: Additional Goal #1 - Progress: Met  Visit Information  Last OT Received On: 08/05/11 Assistance Needed: +1    Subjective Data  Subjective: Im going home today   Prior Functioning       Cognition  Overall Cognitive Status: Appears within functional limits for tasks assessed/performed Arousal/Alertness: Awake/alert Orientation Level: Appears intact for tasks assessed Behavior During Session: [facility] for tasks performed    Mobility Transfers Sit to Stand: 5: Supervision;From bed;From chair/3-in-1;With upper extremity assist Stand to Sit: 5: Supervision;With upper extremity assist;To chair/3-in-1;To toilet;To bed Details for Transfer Assistance: Pt demo'd safe technique throughout session.   Exercises    Balance    End of Session OT - End of Session Activity Tolerance: Patient tolerated treatment well Patient left: in chair;with call bell/phone within reach   Cayleen Benjamin A OTR/L [phone] 08/05/2011, 8:49 AM

## 2011-08-05 NOTE — Progress Notes (Signed)
Physical Therapy Treatment Patient Details Name: Lori York MRN: [id number] DOB: 20-Oct-1945 Today's Date: 08/05/2011 Time: 4098-1191 PT Time Calculation (min): 12 min  PT Assessment / Plan / Recommendation Comments on Treatment Session  Pt s/p AAA repair and appears to be nearly back to baseline with mobility. Pt denied bed mobility or basic transfers due to awaiting discharge. Nursing notified that pt presumed therapy walker was hers and was able to track down her walker on another unit.     Follow Up Recommendations       Barriers to Discharge        Equipment Recommendations       Recommendations for Other Services    Frequency     Plan Discharge plan remains appropriate;Frequency remains appropriate    Precautions / Restrictions Precautions Precautions: Fall   Pertinent Vitals/Pain No pain    Mobility  Bed Mobility Bed Mobility: Not assessed Transfers Transfers: Sit to Stand;Stand to Sit Sit to Stand: 6: Modified independent (Device/Increase time);From chair/3-in-1 Stand to Sit: 6: Modified independent (Device/Increase time);To chair/3-in-1 Ambulation/Gait Ambulation/Gait Assistance: 6: Modified independent (Device/Increase time) Ambulation Distance (Feet): 250 Feet Assistive device: Rolling walker Ambulation/Gait Assistance Details: Pt with excellent mobility with ambulation today and safe for returning home.  Gait Pattern: Within Functional Limits;Decreased stride length Stairs: Yes Stairs Assistance: 6: Modified independent (Device/Increase time) Stair Management Technique: One rail Left Number of Stairs: 5     Exercises     PT Diagnosis:    PT Problem List:   PT Treatment Interventions:     PT Goals Acute Rehab PT Goals PT Goal: Sit to Stand - Progress: Met PT Goal: Stand to Sit - Progress: Met PT Goal: Ambulate - Progress: Met PT Goal: Up/Down Stairs - Progress: Met  Visit Information  Last PT Received On: 08/05/11 Assistance Needed: +1      Subjective Data  Subjective: "I know that is my walker" - referring to walker that has blue tape and physical therapy labeling. Attempted to direct pt to labeling and need to find her walker but she was adamant that is belonged to her. Nursing aware.    Cognition  Overall Cognitive Status: Appears within functional limits for tasks assessed/performed Arousal/Alertness: Awake/alert Orientation Level: Appears intact for tasks assessed Behavior During Session: [facility] for tasks performed    Balance     End of Session PT - End of Session Activity Tolerance: Patient tolerated treatment well Patient left: in chair Nurse Communication: Mobility status    Delorse Lek 08/05/2011, 1:32 PM Delaney Meigs, PT [phone]

## 2011-08-05 NOTE — Discharge Summary (Signed)
[facility] Discharge Summary   Patient ID:  Lori York MRN: [id number] DOB/AGE: [birth year] 66 y.o.  Admit date: 07/29/2011 Discharge date: 08/05/2011 Date of Surgery: 07/29/2011 Surgeon: Surgeon(s): Lori Kerns, MD  Admission Diagnosis: AAA  Discharge Diagnoses:  AAA  Secondary Diagnoses: Past Medical History  Diagnosis Date  . Long Q-T syndrome     a.  w fx of sudden cardiac arrest;  b. s/p Guidant AICD  . HTN (hypertension)   . Diabetes mellitus   . Glaucoma   . Noncompliance   . CAD (coronary artery disease)     a.  LHC 01/04/11: dLM 20%, pLAD 90%, oD1 80%, RCA 80%, EF 35-40%.;  b. 02/2011 CABG x 3: LIMA->LAD, VG->DIAG, VG->Dist RCA  . Ischemic cardiomyopathy     a. 12/2010 Echo: EF 20-25%, sept/apical AK, Sev Dil LV  . Chronic systolic heart failure   . AAA (abdominal aortic aneurysm)     a. Abd u/s 01/07/11: 5.3x5.5 cm infrarenal fusiform AAA.;  b.06/2011: CTA Abd: 6.2 cm infrarenal AAA  . HLD (hyperlipidemia)   . Asthma   . Renal artery stenosis     a.  LHC 11/12: prox left RA 80% stenosis  . Seizures     in 20's elevated bp  . GERD (gastroesophageal reflux disease)   . Fibromyalgia   . Anemia   . Peripheral vascular disease   . Pleural effusion, left     a. s/p thoracentesis x 2 04/2011 - total of 2.5L taken off  . Sleep apnea     sleep study 2 yrs ago sehv    Procedure(s): ANEURYSM ABDOMINAL AORTIC REPAIR  Discharged Condition: good  HPI:  This is a 66 y.o. female who presents for evaluation of abdominal aortic aneurysm she was last seen in December of 2012 at which time her aneurysm was 6 cm in diameter. However, she also had three-vessel coronary disease and recently underwent coronary bypass grafting by Dr. Cornelius York. She also has a previous history of an AICD. She continues to deny any abdominal or back pain. CT angiogram repeated yesterday which shows aneurysm was 6.2 cm in diameter. There is some thrombus in the neck. There  is tortuosity of the left iliac system. There is no evidence of rupture. Other medical problems include , coronary disease, ischemic cardiomyopathy, hyperlipidemia, asthma. She states that she has quit smoking. All of her medical problems are currently controlled.  Hospital Course:  The patient was admitted to the hospital and taken to the operating room on 07/29/2011 and underwent AAA repair.  The pt tolerated the procedure well and was transported to the PACU in good condition.   By POD 1, she ws mildly acidotic and probably resp compensation for metabolic, which is probably some renal tubular acidosis.  She did not have any bloody stool.  She was requiring Nipride for several days after surgery for BP control.  Her creatinine did rise to 2.69 on POD 2, but this did continue to get better.  Her fluids were increased and she was diuresed and by POD 4, her creatinine was down to 2.1.  She did have a prolonged ileus and her NGT remained in place and she remained NPO for several days after surgery Her ileus resolved and she was taking po diet without difficulty.  She was ambulating and voiding  aher wounds were well healed    Significant Diagnostic Studies: CBC Lab Results  Component Value Date   WBC 8.6 08/05/2011   HGB  10.4* 08/05/2011   HCT 31.7* 08/05/2011   MCV 89.3 08/05/2011   PLT 263 08/05/2011    BMET    Component Value Date/Time   NA 138 08/05/2011 0620   K 4.0 08/05/2011 0620   CL 99 08/05/2011 0620   CO2 29 08/05/2011 0620   GLUCOSE 105* 08/05/2011 0620   BUN 27* 08/05/2011 0620   CREATININE 1.96* 08/05/2011 0620   CREATININE 1.03 07/01/2011 1453   CALCIUM 9.8 08/05/2011 0620   GFRNONAA 26* 08/05/2011 0620   GFRAA 30* 08/05/2011 0620   COAG Lab Results  Component Value Date   INR 1.41 07/29/2011   INR 1.06 07/25/2011   INR 1.65* 03/07/2011     Disposition:  Discharge to :Home Discharge Orders    Future Appointments: Provider: Department: Dept Phone: Center:   10/17/2011  12:50 PM [facility] [phone] [facility]     Future Orders Please Complete By Expires   Resume previous diet      Driving Restrictions      Comments:   No driving for 2 weeks   Lifting restrictions      Comments:   No lifting for 6 weeks   Call MD for:  temperature >100.5      Call MD for:  redness, tenderness, or signs of infection (pain, swelling, bleeding, redness, odor or green/yellow discharge around incision site)      Call MD for:  severe or increased pain, loss or decreased feeling  in affected limb(s)      Discharge wound care:      Comments:   Shower daily with soap and water starting 08/05/11   ABDOMINAL PROCEDURE/ANEURYSM REPAIR/AORTO-BIFEMORAL BYPASS:  Call MD for increased abdominal pain; cramping diarrhea; nausea/vomiting      Discharge patient      Comments:   Discharge pt to home after seen by Dr. Evern York, Lori York  Home Medication Instructions AVW:[id number]   Printed on:08/05/11 0757  Medication Information                    PARoxetine (PAXIL) 20 MG tablet Take 20 mg by mouth daily.            zolpidem (AMBIEN) 10 MG tablet Take 10 mg by mouth at bedtime as needed. For sleep            esomeprazole (NEXIUM) 40 MG capsule Take 40 mg by mouth daily before breakfast.           aspirin 325 MG EC tablet Take 325 mg by mouth daily.           atorvastatin (LIPITOR) 20 MG tablet Take 20 mg by mouth daily.           carvedilol (COREG) 25 MG tablet Take 25 mg by mouth 2 (two) times daily with a meal.           furosemide (LASIX) 40 MG tablet Take 40 mg by mouth 2 (two) times daily.           hydrALAZINE (APRESOLINE) 50 MG tablet Take 50 mg by mouth 3 (three) times daily.           potassium chloride SA (K-DUR,KLOR-CON) 20 MEQ tablet Take 20 mEq by mouth daily.           oxyCODONE (OXY IR/ROXICODONE) 5 MG immediate release tablet Take 1 tablet (5 mg total) by mouth every 6 (six) hours as needed.  Verbal and written Discharge instructions given to the patient. Wound care per Discharge AVS Follow-up Information    Follow up with Lori Kerns, MD in 2 weeks. (Office will call you to arrange your appt)    Contact information:   [address] [place] [address] [phone]          Signed: Marlowe Shores 08/05/2011, 7:57 AM

## 2011-08-09 ENCOUNTER — Telehealth: Payer: Self-pay | Admitting: Vascular Surgery

## 2011-08-09 ENCOUNTER — Other Ambulatory Visit: Payer: Self-pay | Admitting: *Deleted

## 2011-08-12 ENCOUNTER — Encounter: Payer: Self-pay | Admitting: Neurosurgery

## 2011-08-13 ENCOUNTER — Ambulatory Visit: Payer: Medicare Other | Admitting: Neurosurgery

## 2011-08-13 NOTE — Telephone Encounter (Signed)
Left voice mail notifiying pt. Of fu appt.

## 2011-08-14 ENCOUNTER — Ambulatory Visit (INDEPENDENT_AMBULATORY_CARE_PROVIDER_SITE_OTHER): Payer: Medicare Other | Admitting: Neurosurgery

## 2011-08-14 ENCOUNTER — Encounter: Payer: Self-pay | Admitting: Neurosurgery

## 2011-08-14 VITALS — BP 127/79 | HR 69 | Resp 14 | Ht 65.0 in | Wt 151.4 lb

## 2011-08-14 DIAGNOSIS — I714 Abdominal aortic aneurysm, without rupture: Secondary | ICD-10-CM

## 2011-08-14 NOTE — Progress Notes (Signed)
[facility] [place] [facility] Office Note  CC: Initial postop status post open AAA repair Referring Physician: Fields  History of Present Illness: 66 year old patient of Dr. Darrick Penna seen for staple removal after an open AAA repair 07/29/2011. Patient states she has some "pulling sensation" along the suture line otherwise she is not really having any pain. The patient states she's not quite eating like she was but she is able to tolerate oral intake without difficulty. Patient is ambulating independently and has not done any heavy lifting.  Past Medical History  Diagnosis Date  . Long Q-T syndrome     a.  w fx of sudden cardiac arrest;  b. s/p Guidant AICD  . HTN (hypertension)   . Diabetes mellitus   . Glaucoma   . Noncompliance   . CAD (coronary artery disease)     a.  LHC 01/04/11: dLM 20%, pLAD 90%, oD1 80%, RCA 80%, EF 35-40%.;  b. 02/2011 CABG x 3: LIMA->LAD, VG->DIAG, VG->Dist RCA  . Ischemic cardiomyopathy     a. 12/2010 Echo: EF 20-25%, sept/apical AK, Sev Dil LV  . Chronic systolic heart failure   . AAA (abdominal aortic aneurysm)     a. Abd u/s 01/07/11: 5.3x5.5 cm infrarenal fusiform AAA.;  b.06/2011: CTA Abd: 6.2 cm infrarenal AAA  . HLD (hyperlipidemia)   . Asthma   . Renal artery stenosis     a.  LHC 11/12: prox left RA 80% stenosis  . Seizures     in 20's elevated bp  . GERD (gastroesophageal reflux disease)   . Fibromyalgia   . Anemia   . Peripheral vascular disease   . Pleural effusion, left     a. s/p thoracentesis x 2 04/2011 - total of 2.5L taken off  . Sleep apnea     sleep study 2 yrs ago sehv    ROS: [x]  Positive   [ ]  Denies    General: [ ]  Weight loss, [ ]  Fever, [ ]  chills Neurologic: [ ]  Dizziness, [ ]  Blackouts, [ ]  Seizure [ ]  Stroke, [ ]  "Mini stroke", [ ]  Slurred speech, [ ]  Temporary blindness; [ ]  weakness in arms or legs, [ ]  Hoarseness Cardiac: [ ]  Chest pain/pressure, [ ]  Shortness of breath at rest [ ]  Shortness of  breath with exertion, [ ]  Atrial fibrillation or irregular heartbeat Vascular: [ ]  Pain in legs with walking, [ ]  Pain in legs at rest, [ ]  Pain in legs at night,  [ ]  Non-healing ulcer, [ ]  Blood clot in vein/DVT,   Pulmonary: [ ]  Home oxygen, [ ]  Productive cough, [ ]  Coughing up blood, [ ]  Asthma,  [ ]  Wheezing Musculoskeletal:  [ ]  Arthritis, [ ]  Low back pain, [ ]  Joint pain Hematologic: [ ]  Easy Bruising, [ ]  Anemia; [ ]  Hepatitis Gastrointestinal: [ ]  Blood in stool, [ ]  Gastroesophageal Reflux/heartburn, [ ]  Trouble swallowing Urinary: [ ]  chronic Kidney disease, [ ]  on HD - [ ]  MWF or [ ]  TTHS, [ ]  Burning with urination, [ ]  Difficulty urinating Skin: [ ]  Rashes, [ ]  Wounds Psychological: [ ]  Anxiety, [ ]  Depression   Social History History  Substance Use Topics  . Smoking status: Former Smoker -- 0.3 packs/day    Types: Cigarettes    Quit date: 01/12/2011  . Smokeless tobacco: Never Used   Comment:    . Alcohol Use: No    Family History Family History  Problem Relation Age of Onset  .  Long QT syndrome Mother   . Heart disease Mother     before age 31  . Hypertension Mother   . Long QT syndrome Sister   . Heart disease Sister   . Long QT syndrome Brother   . Heart disease Brother   . Heart attack Brother   . Long QT syndrome Daughter   . Heart disease Daughter   . Coronary artery disease Sister 23    Allergies  Allergen Reactions  . Tape Other (See Comments)    Pt states she struggles with removing the adhesive...   . Penicillins Rash    Current Outpatient Prescriptions  Medication Sig Dispense Refill  . aspirin 325 MG EC tablet Take 325 mg by mouth daily.      Marland Kitchen atorvastatin (LIPITOR) 20 MG tablet Take 20 mg by mouth daily.      . carvedilol (COREG) 25 MG tablet Take 25 mg by mouth 2 (two) times daily with a meal.      . esomeprazole (NEXIUM) 40 MG capsule Take 40 mg by mouth daily before breakfast.      . furosemide (LASIX) 40 MG tablet Take 40 mg by  mouth 2 (two) times daily.      . hydrALAZINE (APRESOLINE) 50 MG tablet Take 50 mg by mouth 3 (three) times daily.      Marland Kitchen PARoxetine (PAXIL) 20 MG tablet Take 20 mg by mouth daily.       . potassium chloride SA (K-DUR,KLOR-CON) 20 MEQ tablet Take 20 mEq by mouth daily.      Marland Kitchen zolpidem (AMBIEN) 10 MG tablet Take 10 mg by mouth at bedtime as needed. For sleep         Physical Examination  Filed Vitals:   08/14/11 1155  BP: 127/79  Pulse: 69  Resp: 14    Body mass index is 25.19 kg/(m^2).  General:  WDWN in NAD Gait: Normal HEENT: WNL Eyes: Pupils equal Pulmonary: normal non-labored breathing , without Rales, rhonchi,  wheezing Cardiac: RRR, without  Murmurs, rubs or gallops; No carotid bruits Abdomen: soft, NT, no masses Skin: no rashes, ulcers noted Vascular Exam/Pulses: Palpable lower extremity pulses bilaterally  Extremities without ischemic changes, no Gangrene , no cellulitis; no open wounds;  Musculoskeletal: no muscle wasting or atrophy  Neurologic: A&O X 3; Appropriate Affect ; SENSATION: normal; MOTOR FUNCTION:  moving all extremities equally. Speech is fluent/normal  Non-Invasive Vascular Imaging:  ASSESSMENT/PLAN: Healing open AAA repair, staples to be removed today, I do not see any redness or drainage along the suture line. The patient states she does not need anymore pain medicine as she has oxycodone Dr. Darrick Penna prescribed at discharge. The patient will followup with Dr. Darrick Penna only in 2 weeks, her questions were encouraged and answered. The patient knows to do no heavy lifting or housework until that time.

## 2011-08-21 ENCOUNTER — Encounter: Payer: Self-pay | Admitting: Vascular Surgery

## 2011-08-22 ENCOUNTER — Ambulatory Visit (INDEPENDENT_AMBULATORY_CARE_PROVIDER_SITE_OTHER): Payer: Medicare Other | Admitting: Vascular Surgery

## 2011-08-22 ENCOUNTER — Encounter: Payer: Self-pay | Admitting: Vascular Surgery

## 2011-08-22 VITALS — BP 109/71 | HR 85 | Resp 18 | Ht 65.0 in | Wt 146.0 lb

## 2011-08-22 DIAGNOSIS — I714 Abdominal aortic aneurysm, without rupture, unspecified: Secondary | ICD-10-CM

## 2011-08-22 DIAGNOSIS — I739 Peripheral vascular disease, unspecified: Secondary | ICD-10-CM

## 2011-08-22 DIAGNOSIS — L98499 Non-pressure chronic ulcer of skin of other sites with unspecified severity: Secondary | ICD-10-CM

## 2011-08-22 MED ORDER — CARRASYN HYDROGEL WOUND DRESS EX GEL: CUTANEOUS | Status: AC | PRN

## 2011-08-22 NOTE — Progress Notes (Signed)
Patient returns for followup today. She underwent repair of abdominal aortic aneurysm on June 17. This was an open repair. She still has some complaints of intermittent nausea and constipation. She is still requiring some narcotic for pain. She returns today for further followup.  Physical exam: Filed Vitals:   08/22/11 1113  BP: 109/71  Pulse: 85  Resp: 18  Height: 5\' 5"  (1.651 m)  Weight: 146 lb (66.225 kg)   Abdomen: There is a 3 x 1 cm area of poorly healing incision near her umbilicus. This was debrided today. There was good granulation tissue below this. Otherwise the remainder of incisions well-healed. She has no evidence of hernia. She has 2+ femoral pulses. She had appropriate level of, pain not have any obvious evidence of obstruction.  Assessment: Slowly improving post abdominal aortic aneurysm repair. Local wound care for umbilical portion of abdominal incision. Hydrogel once daily. She was given a prescription today for an additional 40 of oxycodone no further refills. She'll return for followup in 2 weeks.  [name], MD [facility] [place] Office: [phone] Pager: [phone]

## 2011-09-04 ENCOUNTER — Encounter: Payer: Self-pay | Admitting: Vascular Surgery

## 2011-09-05 ENCOUNTER — Ambulatory Visit: Payer: Medicare Other | Admitting: Vascular Surgery

## 2011-09-11 ENCOUNTER — Encounter: Payer: Self-pay | Admitting: Vascular Surgery

## 2011-09-12 ENCOUNTER — Ambulatory Visit: Payer: Medicare Other | Admitting: Vascular Surgery

## 2011-09-16 NOTE — Addendum Note (Signed)
Addendum  created 09/16/11 2956 by Sharlet Salina, CRNA   Modules edited:Anesthesia Events

## 2011-09-25 ENCOUNTER — Encounter: Payer: Self-pay | Admitting: Vascular Surgery

## 2011-09-26 ENCOUNTER — Ambulatory Visit: Payer: Medicare Other | Admitting: Vascular Surgery

## 2011-10-09 ENCOUNTER — Encounter: Payer: Self-pay | Admitting: Vascular Surgery

## 2011-10-10 ENCOUNTER — Ambulatory Visit: Payer: Medicare Other | Admitting: Vascular Surgery

## 2011-10-17 ENCOUNTER — Encounter: Payer: Medicare Other | Admitting: *Deleted

## 2011-10-21 ENCOUNTER — Encounter: Payer: Self-pay | Admitting: *Deleted

## 2011-11-05 ENCOUNTER — Encounter (HOSPITAL_COMMUNITY): Payer: Self-pay | Admitting: Emergency Medicine

## 2011-11-05 ENCOUNTER — Emergency Department (HOSPITAL_COMMUNITY)
Admission: EM | Admit: 2011-11-05 | Discharge: 2011-11-05 | Disposition: A | Discharge status: Home / Self Care | Payer: Medicare Other | Attending: Emergency Medicine | Admitting: Emergency Medicine

## 2011-11-05 ENCOUNTER — Emergency Department (HOSPITAL_COMMUNITY): Payer: Medicare Other

## 2011-11-05 DIAGNOSIS — Z880 Allergy status to penicillin: Secondary | ICD-10-CM | POA: Insufficient documentation

## 2011-11-05 DIAGNOSIS — I10 Essential (primary) hypertension: Secondary | ICD-10-CM | POA: Insufficient documentation

## 2011-11-05 DIAGNOSIS — S8990XA Unspecified injury of unspecified lower leg, initial encounter: Secondary | ICD-10-CM | POA: Insufficient documentation

## 2011-11-05 DIAGNOSIS — X500XXA Overexertion from strenuous movement or load, initial encounter: Secondary | ICD-10-CM | POA: Insufficient documentation

## 2011-11-05 DIAGNOSIS — W19XXXA Unspecified fall, initial encounter: Secondary | ICD-10-CM

## 2011-11-05 DIAGNOSIS — J45909 Unspecified asthma, uncomplicated: Secondary | ICD-10-CM | POA: Insufficient documentation

## 2011-11-05 DIAGNOSIS — Z9109 Other allergy status, other than to drugs and biological substances: Secondary | ICD-10-CM | POA: Insufficient documentation

## 2011-11-05 DIAGNOSIS — Z87891 Personal history of nicotine dependence: Secondary | ICD-10-CM | POA: Insufficient documentation

## 2011-11-05 DIAGNOSIS — S8991XA Unspecified injury of right lower leg, initial encounter: Secondary | ICD-10-CM

## 2011-11-05 DIAGNOSIS — K219 Gastro-esophageal reflux disease without esophagitis: Secondary | ICD-10-CM | POA: Insufficient documentation

## 2011-11-05 DIAGNOSIS — S99929A Unspecified injury of unspecified foot, initial encounter: Secondary | ICD-10-CM | POA: Insufficient documentation

## 2011-11-05 DIAGNOSIS — Z8249 Family history of ischemic heart disease and other diseases of the circulatory system: Secondary | ICD-10-CM | POA: Insufficient documentation

## 2011-11-05 DIAGNOSIS — Z8489 Family history of other specified conditions: Secondary | ICD-10-CM | POA: Insufficient documentation

## 2011-11-05 DIAGNOSIS — I2510 Atherosclerotic heart disease of native coronary artery without angina pectoris: Secondary | ICD-10-CM | POA: Insufficient documentation

## 2011-11-05 MED ORDER — HYDROCODONE-ACETAMINOPHEN 5-325 MG PO TABS: 1.0000 | ORAL_TABLET | ORAL | Status: DC | PRN

## 2011-11-05 MED ORDER — HYDROCODONE-ACETAMINOPHEN 5-325 MG PO TABS
1.0000 | ORAL_TABLET | Freq: Once | ORAL | Status: AC
  Administered 2011-11-05: 1 via ORAL
  Filled 2011-11-05: qty 1

## 2011-11-05 NOTE — ED Provider Notes (Signed)
History     CSN: [id number]  Arrival date & time 11/05/11  0940   First MD Initiated Contact with Patient 11/05/11 1046      Chief Complaint  Patient presents with  . Knee Pain  . Fall    (Consider location/radiation/quality/duration/timing/severity/associated sxs/prior treatment) HPI Comments: Patient reports she stepped on uneven ground yesterday, rolling her left ankle and twisting her right knee, causing her to fall to the ground.  Pt also has mild right lower back pain.  Pt is mostly concerned about her right knee, which she states is swollen and painful with attempting to bend.  Pain is now 8/10. Pt usually walks without assistance, has been using a walker for support since the fall.  Denies hitting head, LOC, generalized weakness, dizziness.  Denies any chest pain, abdominal pain, neck pain, hip pain.  Family member states she was present during fall and notes patient did not lose consciousness or hit head, was acting like her normal self the entire time - no confusion.   Pt has had surgery on her right knee in 1970 for dislocation and ligamentous injury.  She does not currently follow with an orthopedist.    Patient is a 66 y.o. female presenting with knee pain and fall. The history is provided by the patient and a relative.  Knee Pain Associated symptoms include joint swelling. Pertinent negatives include no abdominal pain, chest pain, headaches or neck pain.  Fall Pertinent negatives include no abdominal pain and no headaches.    Past Medical History  Diagnosis Date  . Long Q-T syndrome     a.  w fx of sudden cardiac arrest;  b. s/p Guidant AICD  . HTN (hypertension)   . Diabetes mellitus   . Glaucoma   . Noncompliance   . CAD (coronary artery disease)     a.  LHC 01/04/11: dLM 20%, pLAD 90%, oD1 80%, RCA 80%, EF 35-40%.;  b. 02/2011 CABG x 3: LIMA->LAD, VG->DIAG, VG->Dist RCA  . Ischemic cardiomyopathy     a. 12/2010 Echo: EF 20-25%, sept/apical AK, Sev Dil LV  .  Chronic systolic heart failure   . AAA (abdominal aortic aneurysm)     a. Abd u/s 01/07/11: 5.3x5.5 cm infrarenal fusiform AAA.;  b.06/2011: CTA Abd: 6.2 cm infrarenal AAA  . HLD (hyperlipidemia)   . Asthma   . Renal artery stenosis     a.  LHC 11/12: prox left RA 80% stenosis  . Seizures     in 20's elevated bp  . GERD (gastroesophageal reflux disease)   . Fibromyalgia   . Anemia   . Peripheral vascular disease   . Pleural effusion, left     a. s/p thoracentesis x 2 04/2011 - total of 2.5L taken off  . Sleep apnea     sleep study 2 yrs ago sehv    Past Surgical History  Procedure Date  . Cardiac defibrillator placement     in 1997 with revision to a  Guidant model 1860 single chamber implantable cardioverter- defib on Feb18, 2003, most recent generator change 2/11 by Dr Johney Frame SJM  . Laminectomy May 2008  . Right knee resconstructuion 1970  . Laminectomy 11/20/07     LF5-S1 fusion  . Back surgery   . Tonsillectomy   . Abdominal hysterectomy 88  . Hemorroidectomy 77  . Coronary artery bypass graft 03/07/2011    Procedure: CORONARY ARTERY BYPASS GRAFTING (CABG);  Surgeon: Purcell Nails, MD;  Location: [facility] OR;  Service: Open  Heart Surgery;  Laterality: N/A;  Coronary artery bypass graft times three using left internal mammary artery and left leg greater saphenous vein harvested endoscopically  . Insert / replace / remove pacemaker     2003  . Abdominal aortic aneurysm repair 07/29/2011    Procedure: ANEURYSM ABDOMINAL AORTIC REPAIR;  Surgeon: Sherren Kerns, MD;  Location: [facility] OR;  Service: Vascular;  Laterality: N/A;  Resection and grafting Abdominal Aortic Aneurysm with straight 18mm x 30cm hemasheild graft  . Abdominal aortic aneurysm repair 07/29/11    Family History  Problem Relation Age of Onset  . Long QT syndrome Mother   . Heart disease Mother     before age 63  . Hypertension Mother   . Long QT syndrome Sister   . Heart disease Sister   . Long QT syndrome Brother     . Heart disease Brother   . Heart attack Brother   . Long QT syndrome Daughter   . Heart disease Daughter   . Coronary artery disease Sister 15    History  Substance Use Topics  . Smoking status: Former Smoker -- 0.3 packs/day    Types: Cigarettes    Quit date: 01/12/2011  . Smokeless tobacco: Never Used   Comment:    . Alcohol Use: No    OB History    Grav Para Term Preterm Abortions TAB SAB Ect Mult Living                  Review of Systems  HENT: Negative for neck pain.   Respiratory: Negative for shortness of breath.   Cardiovascular: Negative for chest pain.  Gastrointestinal: Negative for abdominal pain.  Musculoskeletal: Positive for back pain and joint swelling.  Skin: Negative for wound.  Neurological: Negative for syncope and headaches.    Allergies  Tape and Penicillins  Home Medications   Current Outpatient Rx  Name Route Sig Dispense Refill  . ASPIRIN 325 MG PO TBEC Oral Take 325 mg by mouth daily.    Marland Kitchen CARVEDILOL 25 MG PO TABS Oral Take 25 mg by mouth 2 (two) times daily with a meal.    . ESOMEPRAZOLE MAGNESIUM 40 MG PO CPDR Oral Take 40 mg by mouth daily before breakfast.    . FUROSEMIDE 40 MG PO TABS Oral Take 40 mg by mouth 2 (two) times daily.    Marland Kitchen HYDRALAZINE HCL 50 MG PO TABS Oral Take 50 mg by mouth 3 (three) times daily.    Marland Kitchen PAROXETINE HCL 20 MG PO TABS Oral Take 20 mg by mouth daily.     Marland Kitchen POTASSIUM CHLORIDE CRYS ER 20 MEQ PO TBCR Oral Take 20 mEq by mouth daily.    Marland Kitchen ZOLPIDEM TARTRATE 10 MG PO TABS Oral Take 10 mg by mouth at bedtime as needed. For sleep       BP 141/89  Pulse 70  Temp 98.4 F (36.9 C) (Oral)  Resp 18  SpO2 99%  Physical Exam  Nursing note and vitals reviewed. Constitutional: She appears well-developed and well-nourished. No distress.  HENT:  Head: Normocephalic and atraumatic.  Neck: Normal range of motion. Neck supple.  Cardiovascular: Normal rate and regular rhythm.   Pulmonary/Chest: Effort normal and  breath sounds normal. No respiratory distress. She has no wheezes. She has no rales. She exhibits no tenderness.       Surgical scar, healed  Abdominal: Soft. She exhibits no distension and no mass. There is no tenderness. There is no rebound and  no guarding.       Vertical surgical scar, completely healed  Musculoskeletal:       Right hip: Normal.       Left hip: Normal.       Right knee: She exhibits decreased range of motion and swelling. She exhibits no ecchymosis, no deformity, no laceration, no erythema and normal alignment.       Left knee: Normal.       Right ankle: Normal.       Left ankle: She exhibits swelling. She exhibits normal range of motion, no ecchymosis, no deformity, no laceration and normal pulse. Achilles tendon normal.       Cervical back: She exhibits no bony tenderness.       Thoracic back: She exhibits no bony tenderness.       Lumbar back: She exhibits no bony tenderness.       Back:       Right lower leg: She exhibits no bony tenderness.       Left lower leg: She exhibits no bony tenderness.       Legs:      Right knee: pt is able to bend to just under 90 degrees, is able to straighten completely.  Distal pulses intact.    Left ankle with mild swelling just distal to lateral malleolus.  No bony tenderness.  Distal pulses intact.  Full AROM.    Spine without crepitus or step-offs.  Nontender.    Neurological: She is alert. She exhibits normal muscle tone.  Skin: She is not diaphoretic.  Psychiatric: She has a normal mood and affect. Her speech is normal and behavior is normal. Thought content normal. Cognition and memory are normal.    ED Course  Procedures (including critical care time)  Labs Reviewed - No data to display Dg Knee Complete 4 Views Right  11/05/2011  *RADIOLOGY REPORT*  Clinical Data: Pain and swelling at medial right knee post fall  RIGHT KNEE - COMPLETE 4+ VIEW  Comparison: None  Findings: Osseous demineralization. Scattered joint space  narrowing and irregularity of articular surface compatible with degenerative changes, more prominent at patellofemoral joint and medial compartment than lateral compartment. Additional patellar spur formation at quadriceps and patellar tendon insertions. No acute fracture, dislocation or bone destruction. No knee joint effusion. Mild scattered atherosclerotic calcifications. Soft tissue swelling medial to the medial femoral condyle.  IMPRESSION: Osseous demineralization. Degenerative changes right knee. No definite acute osseous abnormalities.   Original Report Authenticated By: Lollie Marrow, M.D.    Pt declined further radiographs - states she was really just worried about her knee.    1. Fall   2. Right knee injury       MDM  Pt with mechanical fall yesterday after rolling ankle stepping on uneven ground.  Mild sprain likely of left ankle, not causing patient much concern.  Pt with swelling of right knee without effusion or fracture on xray.  No ecchymosis.  Pt has walker for support, placed in knee sleeve.  D/C home with pain medication and PCP follow up.  Pt also with mild right lower back tenderness.  Pt declined any further xrays.  Discussed all results and follow up with patient.  Pt given return precautions.  Pt verbalizes understanding and agrees with plan.           [place], [place] 11/05/11 1523

## 2011-11-05 NOTE — ED Provider Notes (Signed)
Medical screening examination/treatment/procedure(s) were performed by non-physician practitioner and as supervising physician I was immediately available for consultation/collaboration.  Jones Skene, M.D.     Jones Skene, MD 11/05/11 2116

## 2011-11-05 NOTE — ED Notes (Signed)
Pt sts fell while turning around yesterday and now c/o right knee pain, left ankle pain and right lower back pain; pt denies LOC or hitting head

## 2011-11-05 NOTE — ED Notes (Signed)
Patient also states pain left ankle lateral aspect 1-1.5/10 achy. Full sensation skin warm and dry.

## 2011-11-05 NOTE — ED Notes (Signed)
Paged Ortho  

## 2011-11-05 NOTE — Progress Notes (Signed)
Orthopedic Tech Progress Note Patient Details:  Lori York [birth year] [id number] Knee sleeve applied to patient's right knee. Family member present in room with patient. Nurse stepped in as Ortho Tech completed application. Ortho Devices Type of Ortho Device: Knee Sleeve Ortho Device/Splint Location: Right Ortho Device/Splint Interventions: Application   Asia R Thompson 11/05/2011, 11:22 AM

## 2011-11-06 ENCOUNTER — Encounter: Payer: Self-pay | Admitting: Vascular Surgery

## 2011-11-07 ENCOUNTER — Ambulatory Visit (INDEPENDENT_AMBULATORY_CARE_PROVIDER_SITE_OTHER): Payer: Medicare Other | Admitting: Vascular Surgery

## 2011-11-07 ENCOUNTER — Encounter: Payer: Self-pay | Admitting: Vascular Surgery

## 2011-11-07 VITALS — BP 115/99 | HR 72 | Resp 16 | Ht 65.0 in | Wt 142.7 lb

## 2011-11-07 DIAGNOSIS — Z48812 Encounter for surgical aftercare following surgery on the circulatory system: Secondary | ICD-10-CM

## 2011-11-07 DIAGNOSIS — I714 Abdominal aortic aneurysm, without rupture: Secondary | ICD-10-CM

## 2011-11-07 NOTE — Addendum Note (Signed)
Addended by: Sharee Pimple on: 11/07/2011 04:16 PM   Modules accepted: Orders

## 2011-11-07 NOTE — Progress Notes (Signed)
Patient is a 66 year old female who is status post open abdominal aortic aneurysm repair June of 2013. She returns today for postoperative followup. He recently twisted her right knee but is recovering from this. She states that her appetite has returned. She had a small area around her umbilicus that had not healed completely but this is now healed. She reports that overall she feels well.  Review of systems: She denies shortness of breath. She denies chest pain.  Physical exam:  Filed Vitals:   11/07/11 1328  BP: 115/99  Pulse: 72  Resp: 16  Height: 5\' 5"  (1.651 m)  Weight: 142 lb 11.2 oz (64.728 kg)  SpO2: 100%   Abdomen: Soft nontender nondistended well-healed midline laparotomy incision with no evidence of hernia 2+ femoral pulses bilaterally  Extremities: Brace on right knee no significant edema  Assessment: Doing well status post abdominal aortic aneurysm repair  Point: Followup CT angiogram abdomen and pelvis in one year  [name], MD [facility] Office: [phone] Pager: [phone]

## 2012-02-06 ENCOUNTER — Encounter: Payer: Self-pay | Admitting: *Deleted

## 2012-02-14 ENCOUNTER — Telehealth: Payer: Self-pay | Admitting: Cardiology

## 2012-02-14 NOTE — Telephone Encounter (Signed)
Patient called to ask what to do after her ICD had fired twice. First occurrence was on 12/31 while she was sitting at the dinner table. Occurred without warning though she reports feeling a "cool sensation" over her head. After the event, she reports having "facial swelling" that later led to facial drooping, slurring of speech and drooling which has continued to today. Did not seek care at this time. Today she had another ICD firing without warning. No loss of consciousness. No prodrome.   She admits that her symptoms do sound like a stroke now in retrospect. I recommend she seek emergent care for both the stroke-like symptoms and the repeat ICD firing. She is agreeable to seek emergent care in [place] (she is staying with her sister).     Will notify Dr. Ty Hilts of telephone call.

## 2012-02-16 NOTE — Telephone Encounter (Signed)
I do not see that she presented to [facility]. I will have our device clinic contact the patient to arrange to have her device interrogated.

## 2012-02-17 NOTE — Telephone Encounter (Signed)
Spoke w/pt---pt still in [place] at this time and will be back in town this Friday. appt was made for 02-24-12 @ 1400. Pt aware of this appt and plans to be here. Pt went to ER in [place] but left after several hours waiting with no response. Pt feels much better than she did on weekend.

## 2012-02-24 ENCOUNTER — Encounter: Payer: Self-pay | Admitting: Internal Medicine

## 2012-02-24 ENCOUNTER — Ambulatory Visit (INDEPENDENT_AMBULATORY_CARE_PROVIDER_SITE_OTHER): Payer: Medicare Other | Admitting: *Deleted

## 2012-02-24 DIAGNOSIS — I429 Cardiomyopathy, unspecified: Secondary | ICD-10-CM

## 2012-02-24 DIAGNOSIS — I5022 Chronic systolic (congestive) heart failure: Secondary | ICD-10-CM

## 2012-02-24 LAB — ICD DEVICE OBSERVATION
BRDY-0002RV: 40 {beats}/min
DEV-0020ICD: NEGATIVE
RV LEAD AMPLITUDE: 12 mv
RV LEAD IMPEDENCE ICD: 300 Ohm
RV LEAD THRESHOLD: 1 V
TOT-0010: 12
VENTRICULAR PACING ICD: 0 pct
VF: 3

## 2012-02-24 NOTE — Progress Notes (Signed)
ICD check with [employer] 

## 2012-02-28 ENCOUNTER — Ambulatory Visit (INDEPENDENT_AMBULATORY_CARE_PROVIDER_SITE_OTHER): Payer: Medicare Other | Admitting: Internal Medicine

## 2012-02-28 ENCOUNTER — Encounter: Payer: Self-pay | Admitting: Internal Medicine

## 2012-02-28 VITALS — BP 163/94 | HR 67 | Wt 147.8 lb

## 2012-02-28 DIAGNOSIS — R5381 Other malaise: Secondary | ICD-10-CM

## 2012-02-28 DIAGNOSIS — I5022 Chronic systolic (congestive) heart failure: Secondary | ICD-10-CM

## 2012-02-28 DIAGNOSIS — I472 Ventricular tachycardia, unspecified: Secondary | ICD-10-CM

## 2012-02-28 DIAGNOSIS — I471 Supraventricular tachycardia, unspecified: Secondary | ICD-10-CM

## 2012-02-28 DIAGNOSIS — R55 Syncope and collapse: Secondary | ICD-10-CM

## 2012-02-28 DIAGNOSIS — G7111 Myotonic muscular dystrophy: Secondary | ICD-10-CM

## 2012-02-28 DIAGNOSIS — I10 Essential (primary) hypertension: Secondary | ICD-10-CM

## 2012-02-28 DIAGNOSIS — R5383 Other fatigue: Secondary | ICD-10-CM

## 2012-02-28 DIAGNOSIS — I2510 Atherosclerotic heart disease of native coronary artery without angina pectoris: Secondary | ICD-10-CM

## 2012-02-28 DIAGNOSIS — I43 Cardiomyopathy in diseases classified elsewhere: Secondary | ICD-10-CM

## 2012-02-28 DIAGNOSIS — I4581 Long QT syndrome: Secondary | ICD-10-CM

## 2012-02-28 LAB — BASIC METABOLIC PANEL
BUN: 21 mg/dL (ref 6–23)
CO2: 33 mEq/L — ABNORMAL HIGH (ref 19–32)
Calcium: 10 mg/dL (ref 8.4–10.5)
Creatinine, Ser: 1.3 mg/dL — ABNORMAL HIGH (ref 0.4–1.2)
Glucose, Bld: 102 mg/dL — ABNORMAL HIGH (ref 70–99)

## 2012-02-28 MED ORDER — FUROSEMIDE 40 MG PO TABS: 40.0000 mg | ORAL_TABLET | Freq: Every day | ORAL | Status: DC

## 2012-02-28 MED ORDER — CARVEDILOL 25 MG PO TABS: ORAL_TABLET | ORAL | Status: DC

## 2012-02-28 NOTE — Patient Instructions (Addendum)
Your physician recommends that you schedule a follow-up appointment in: 4 weeks with Dr Johney Frame   NO DRIVING FOR 6 MONTHS   Your physician has recommended you make the following change in your medication:  1) Increase Carvedilol to 37.5mg  twice daily  1 1/2 of the 25mg  tablets twice daily 2) Decrease Furosemide to 40mg  daily  Weigh yourself daily   Call [facility] [phone]  LONG QT Genetic testing  Call Tresa Endo after you find out cost and if you are going to proceed

## 2012-03-02 LAB — ICD DEVICE OBSERVATION: DEV-0020ICD: NEGATIVE

## 2012-03-03 ENCOUNTER — Other Ambulatory Visit: Payer: Self-pay | Admitting: *Deleted

## 2012-03-03 DIAGNOSIS — E0590 Thyrotoxicosis, unspecified without thyrotoxic crisis or storm: Secondary | ICD-10-CM

## 2012-03-08 ENCOUNTER — Encounter: Payer: Self-pay | Admitting: Internal Medicine

## 2012-03-08 DIAGNOSIS — I472 Ventricular tachycardia: Secondary | ICD-10-CM | POA: Insufficient documentation

## 2012-03-08 NOTE — Progress Notes (Signed)
PCP:  Gwynneth Aliment, MD  The patient presents today for electrophysiology followup.   She reports that she recently received 3 ICD shocks.  These occurred on January 3rd and 9th.  She reports that she was at rest when the episodes occurred.  She denies using drugs or drinking ETOH.  She reports that she has recently been compliant with medical therapy.  She reports occasional postural dizziness.  Today, she denies symptoms of palpitations, chest pain, shortness of breath, orthopnea, PND, lower extremity edema,  or neurologic sequela.  The patient feels that she is tolerating medications without difficulties and is otherwise without complaint today.   Past Medical History  Diagnosis Date  . Long Q-T syndrome     a.  w fx of sudden cardiac arrest;  b. s/p Guidant AICD  . HTN (hypertension)   . Diabetes mellitus   . Glaucoma(365)   . Noncompliance   . CAD (coronary artery disease)     a.  LHC 01/04/11: dLM 20%, pLAD 90%, oD1 80%, RCA 80%, EF 35-40%.;  b. 02/2011 CABG x 3: LIMA->LAD, VG->DIAG, VG->Dist RCA  . Ischemic cardiomyopathy     a. 12/2010 Echo: EF 20-25%, sept/apical AK, Sev Dil LV  . Chronic systolic heart failure   . AAA (abdominal aortic aneurysm)     a. Abd u/s 01/07/11: 5.3x5.5 cm infrarenal fusiform AAA.;  b.06/2011: CTA Abd: 6.2 cm infrarenal AAA  . HLD (hyperlipidemia)   . Asthma   . Renal artery stenosis     a.  LHC 11/12: prox left RA 80% stenosis  . Seizures     in 20's elevated bp  . GERD (gastroesophageal reflux disease)   . Fibromyalgia   . Anemia   . Peripheral vascular disease   . Pleural effusion, left     a. s/p thoracentesis x 2 04/2011 - total of 2.5L taken off  . Sleep apnea     sleep study 2 yrs ago sehv  . Polymorphic ventricular tachycardia     s/p appropriate ICD therapy   Past Surgical History  Procedure Date  . Cardiac defibrillator placement     in 1997 with revision to a  Guidant model 1860 single chamber implantable cardioverter- defib on  Feb18, 2003, most recent generator change 2/11 by Dr Johney Frame SJM  . Laminectomy May 2008  . Right knee resconstructuion 1970  . Laminectomy 11/20/07     LF5-S1 fusion  . Back surgery   . Tonsillectomy   . Abdominal hysterectomy 88  . Hemorroidectomy 77  . Coronary artery bypass graft 03/07/2011    Procedure: CORONARY ARTERY BYPASS GRAFTING (CABG);  Surgeon: Purcell Nails, MD;  Location: [facility] OR;  Service: Open Heart Surgery;  Laterality: N/A;  Coronary artery bypass graft times three using left internal mammary artery and left leg greater saphenous vein harvested endoscopically  . Abdominal aortic aneurysm repair 07/29/2011    Procedure: ANEURYSM ABDOMINAL AORTIC REPAIR;  Surgeon: Sherren Kerns, MD;  Location: [facility] OR;  Service: Vascular;  Laterality: N/A;  Resection and grafting Abdominal Aortic Aneurysm with straight 18mm x 30cm hemasheild graft    Current Outpatient Prescriptions  Medication Sig Dispense Refill  . aspirin 325 MG EC tablet Take 325 mg by mouth daily.      Marland Kitchen atorvastatin (LIPITOR) 20 MG tablet Take 20 mg by mouth daily.      . carvedilol (COREG) 25 MG tablet Take 1 1/2 tablets twice daily  90 tablet  3  . furosemide (  LASIX) 40 MG tablet Take 1 tablet (40 mg total) by mouth daily.  30 tablet    . hydrALAZINE (APRESOLINE) 50 MG tablet Take 50 mg by mouth 3 (three) times daily.      Marland Kitchen HYDROcodone-acetaminophen (NORCO/VICODIN) 5-325 MG per tablet Take 1 tablet by mouth every 4 (four) hours as needed.      . nitroGLYCERIN (NITROSTAT) 0.4 MG SL tablet Place 0.4 mg under the tongue every 5 (five) minutes as needed.      Marland Kitchen oxyCODONE (OXY IR/ROXICODONE) 5 MG immediate release tablet Take by mouth as needed.      Marland Kitchen PARoxetine (PAXIL) 20 MG tablet Take 20 mg by mouth daily.       . potassium chloride SA (K-DUR,KLOR-CON) 20 MEQ tablet Take 10 mEq by mouth daily.       Marland Kitchen zolpidem (AMBIEN) 10 MG tablet Take 10 mg by mouth at bedtime as needed. For sleep         Allergies  Allergen  Reactions  . Tape Other (See Comments)    Pt states she struggles with removing the adhesive...   . Penicillins Rash    History   Social History  . Marital Status: Widowed    Spouse Name: N/A    Number of Children: N/A  . Years of Education: N/A   Occupational History  . Not on file.   Social History Main Topics  . Smoking status: Former Smoker -- 0.3 packs/day    Types: Cigarettes    Quit date: 01/12/2011  . Smokeless tobacco: Never Used     Comment:    . Alcohol Use: No  . Drug Use: No  . Sexually Active: No     Comment: hysterectomy   Other Topics Concern  . Not on file   Social History Narrative   Lives in [place] with her adopted son who is 42 and a god brother, who is 32. 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     Family History  Problem Relation Age of Onset  . Long QT syndrome Mother   . Heart disease Mother     before age 71  . Hypertension Mother   . Long QT syndrome Sister   . Heart disease Sister   . Long QT syndrome Brother   . Heart disease Brother   . Heart attack Brother   . Long QT syndrome Daughter   . Heart disease Daughter   . Coronary artery disease Sister 67    ROS-  All systems are reviewed and are negative except as outlined in the HPI above   Physical Exam: Filed Vitals:   02/28/12 1243  BP: 163/94  Pulse: 67  Weight: 147 lb 12.8 oz (67.042 kg)    GEN- The patient is well appearing, alert and oriented x 3 today.   Head- normocephalic, atraumatic Eyes-  Sclera clear, conjunctiva pink Ears- hearing intact Oropharynx- clear Neck- supple, no JVP Lymph- no cervical lymphadenopathy Lungs- Clear to ausculation bilaterally, normal work of breathing Chest- ICD pocket is well healed Heart- Regular rate and rhythm, no murmurs, rubs or gallops, PMI not laterally displaced GI- soft, NT, ND, + BS Extremities- no clubbing, cyanosis, or edema MS- no significant deformity or atrophy Skin-  no rash or lesion Psych- euthymic mood, full affect Neuro- strength and sensation are intact  ekg today reveals sinus rhythm 67 bpm, Qtc 659 msec, LVH with repolarization abnormality ICD interrogation- reviewed in detail  today,  See PACEART report  Assessment and Plan:  1. Chronic systolic dysfunction/ CAD Appears dry today.  No ischemic symptoms Daily weights and sodium restriction are advised Decrease lasix to 40mg  daily at this time  2. Polymorphic VT/ prolonged QT Check electrolytes and TFTs today Importance of compliance with medicine was encouraged today No driving x 6 months is strongly enforced with her today.  She states that she does not drive Increase coreg to [id number]  BID Return in 4 weeks Normal ICD function See Arita Miss Art report No changes today   She has a very strong family history of sudden death likely due to prolonged QT.  I had a long conversation today (30 minutes) regarding the importance of screening of all of her family members for long QT.  I have encouraged baseline EKGs for all first and second degree relatives.  In addition, per HRS guidelines, I have recommended genetic testing for the patient.  I think that this would be helpful to better determine which variant of long qt she has which will help direct therapies for her.  In addition, this would be very helpful to screen her family.  She is not ready to proceed but will consider this option. Her EKG suggests more of a long QT 3 pattern, which often is less responsive to beta blockers. If she continues to have breakthrough episodes, we may have to consider left sympatheic ganglion denervation.  She will return for follow-up in 4 weeks

## 2012-03-09 ENCOUNTER — Encounter: Payer: Medicare Other | Admitting: Internal Medicine

## 2012-03-10 ENCOUNTER — Telehealth: Payer: Self-pay | Admitting: Internal Medicine

## 2012-03-10 NOTE — Telephone Encounter (Signed)
Dr Johney Frame is not going to call in a medication for her.  We have made a referral to Endocrine.  Patient aware

## 2012-03-10 NOTE — Telephone Encounter (Signed)
Pt  Calling to see if dr allred is going to call in an rx for her thyroid, hasn't heard back yet, uses [facility] [address], [address] [place]

## 2012-04-03 ENCOUNTER — Ambulatory Visit (INDEPENDENT_AMBULATORY_CARE_PROVIDER_SITE_OTHER): Payer: Medicaid Other | Admitting: Internal Medicine

## 2012-04-03 ENCOUNTER — Encounter: Payer: Self-pay | Admitting: Internal Medicine

## 2012-04-03 VITALS — BP 142/69 | HR 60 | Ht 65.0 in | Wt 158.2 lb

## 2012-04-03 DIAGNOSIS — I4729 Other ventricular tachycardia: Secondary | ICD-10-CM

## 2012-04-03 DIAGNOSIS — I4581 Long QT syndrome: Secondary | ICD-10-CM

## 2012-04-03 DIAGNOSIS — I472 Ventricular tachycardia, unspecified: Secondary | ICD-10-CM

## 2012-04-03 DIAGNOSIS — I5022 Chronic systolic (congestive) heart failure: Secondary | ICD-10-CM

## 2012-04-03 LAB — ICD DEVICE OBSERVATION
RV LEAD AMPLITUDE: 12 mv
RV LEAD IMPEDENCE ICD: 330 Ohm
VENTRICULAR PACING ICD: 1 pct

## 2012-04-03 NOTE — Patient Instructions (Addendum)
Remote monitoring is used to monitor your Pacemaker of ICD from home. This monitoring reduces the number of office visits required to check your device to one time per year. It allows [place] to keep an eye on the functioning of your device to ensure it is working properly. You are scheduled for a device check from home on Jun 29, 2012. You may send your transmission at any time that day. If you have a wireless device, the transmission will be sent automatically. After your physician reviews your transmission, you will receive a postcard with your next transmission date.  Your physician wants you to follow-up in: 3 months with Lori York 1 year with Dr Johney Frame.  You will receive a reminder letter in the mail two months in advance. If you don't receive a letter, please call our office to schedule the follow-up appointment.

## 2012-04-03 NOTE — Progress Notes (Signed)
PCP:  Dorrene German, MD  The patient presents today for electrophysiology followup.   She states that she was previously noncompliant with medicine but has now begun taking them.  She has had no further arrhythmias.   Today, she denies symptoms of palpitations, chest pain, shortness of breath, orthopnea, PND, lower extremity edema,  or neurologic sequela.  The patient feels that she is tolerating medications without difficulties and is otherwise without complaint today.   She is thinking about moving to [place] within the next few weeks to be near her grandchildren.  Past Medical History  Diagnosis Date  . Long Q-T syndrome     a.  w fx of sudden cardiac arrest;  b. s/p Guidant AICD  . HTN (hypertension)   . Diabetes mellitus   . Glaucoma(365)   . Noncompliance   . CAD (coronary artery disease)     a.  LHC 01/04/11: dLM 20%, pLAD 90%, oD1 80%, RCA 80%, EF 35-40%.;  b. 02/2011 CABG x 3: LIMA->LAD, VG->DIAG, VG->Dist RCA  . Ischemic cardiomyopathy     a. 12/2010 Echo: EF 20-25%, sept/apical AK, Sev Dil LV  . Chronic systolic heart failure   . AAA (abdominal aortic aneurysm)     a. Abd u/s 01/07/11: 5.3x5.5 cm infrarenal fusiform AAA.;  b.06/2011: CTA Abd: 6.2 cm infrarenal AAA  . HLD (hyperlipidemia)   . Asthma   . Renal artery stenosis     a.  LHC 11/12: prox left RA 80% stenosis  . Seizures     in 20's elevated bp  . GERD (gastroesophageal reflux disease)   . Fibromyalgia   . Anemia   . Peripheral vascular disease   . Pleural effusion, left     a. s/p thoracentesis x 2 04/2011 - total of 2.5L taken off  . Sleep apnea     sleep study 2 yrs ago sehv  . Polymorphic ventricular tachycardia     s/p appropriate ICD therapy   Past Surgical History  Procedure Laterality Date  . Cardiac defibrillator placement      in 1997 with revision to a  Guidant model 1860 single chamber implantable cardioverter- defib on Feb18, 2003, most recent generator change 2/11 by Dr Johney Frame SJM  . Laminectomy   May 2008  . Right knee resconstructuion  1970  . Laminectomy  11/20/07     LF5-S1 fusion  . Back surgery    . Tonsillectomy    . Abdominal hysterectomy  88  . Hemorroidectomy  77  . Coronary artery bypass graft  03/07/2011    Procedure: CORONARY ARTERY BYPASS GRAFTING (CABG);  Surgeon: Purcell Nails, MD;  Location: [facility] OR;  Service: Open Heart Surgery;  Laterality: N/A;  Coronary artery bypass graft times three using left internal mammary artery and left leg greater saphenous vein harvested endoscopically  . Abdominal aortic aneurysm repair  07/29/2011    Procedure: ANEURYSM ABDOMINAL AORTIC REPAIR;  Surgeon: Sherren Kerns, MD;  Location: [facility] OR;  Service: Vascular;  Laterality: N/A;  Resection and grafting Abdominal Aortic Aneurysm with straight 18mm x 30cm hemasheild graft    Current Outpatient Prescriptions  Medication Sig Dispense Refill  . aspirin 325 MG EC tablet Take 325 mg by mouth daily.      Marland Kitchen atorvastatin (LIPITOR) 20 MG tablet Take 20 mg by mouth daily.      . carvedilol (COREG) 25 MG tablet Take 1 1/2 tablets twice daily  90 tablet  3  . furosemide (LASIX) 40 MG tablet Take  1 tablet (40 mg total) by mouth daily.  30 tablet    . hydrALAZINE (APRESOLINE) 50 MG tablet Take 50 mg by mouth 3 (three) times daily.      Marland Kitchen HYDROcodone-acetaminophen (NORCO/VICODIN) 5-325 MG per tablet Take 1 tablet by mouth every 4 (four) hours as needed.      . nitroGLYCERIN (NITROSTAT) 0.4 MG SL tablet Place 0.4 mg under the tongue every 5 (five) minutes as needed.      Marland Kitchen oxyCODONE (OXY IR/ROXICODONE) 5 MG immediate release tablet Take by mouth as needed.      Marland Kitchen PARoxetine (PAXIL) 20 MG tablet Take 20 mg by mouth daily.       . potassium chloride SA (K-DUR,KLOR-CON) 20 MEQ tablet Take 10 mEq by mouth daily.       Marland Kitchen zolpidem (AMBIEN) 10 MG tablet Take 10 mg by mouth at bedtime as needed. For sleep        No current facility-administered medications for this visit.    Allergies  Allergen Reactions   . Tape Other (See Comments)    Pt states she struggles with removing the adhesive...   . Penicillins Rash    History   Social History  . Marital Status: Widowed    Spouse Name: N/A    Number of Children: N/A  . Years of Education: N/A   Occupational History  . Not on file.   Social History Main Topics  . Smoking status: Former Smoker -- 0.30 packs/day    Types: Cigarettes    Quit date: 01/12/2011  . Smokeless tobacco: Never Used     Comment:    . Alcohol Use: No  . Drug Use: No  . Sexually Active: No     Comment: hysterectomy   Other Topics Concern  . Not on file   Social History Narrative   Lives in [place] with her adopted son who is 72 and a god brother, who is 58. 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     Family History  Problem Relation Age of Onset  . Long QT syndrome Mother   . Heart disease Mother     before age 22  . Hypertension Mother   . Long QT syndrome Sister   . Heart disease Sister   . Long QT syndrome Brother   . Heart disease Brother   . Heart attack Brother   . Long QT syndrome Daughter   . Heart disease Daughter   . Coronary artery disease Sister 86     Physical Exam: Filed Vitals:   04/03/12 1219  BP: 142/69  Pulse: 60  Height: 5\' 5"  (1.651 m)  Weight: 158 lb 3.2 oz (71.759 kg)    GEN- The patient is well appearing, alert and oriented x 3 today.   Head- normocephalic, atraumatic Eyes-  Sclera clear, conjunctiva pink Ears- hearing intact Oropharynx- clear Neck- supple, no JVP Lymph- no cervical lymphadenopathy Lungs- Clear to ausculation bilaterally, normal work of breathing Chest- ICD pocket is well healed Heart- Regular rate and rhythm, no murmurs, rubs or gallops, PMI not laterally displaced GI- soft, NT, ND, + BS Extremities- no clubbing, cyanosis, or edema MS- no significant deformity or atrophy Skin- no rash or lesion Psych- euthymic mood, full affect Neuro- strength  and sensation are intact  ICD interrogation- reviewed in detail today,  See PACEART report  Assessment and Plan:  1. Chronic systolic dysfunction/ CAD euvolemic today.  No ischemic symptoms  Daily weights and sodium restriction are advised Continue lasix to 40mg  daily at this time  2. Polymorphic VT/ prolonged QT Importance of compliance with medicine was encouraged today Continue coreg to 37.5mg  BID Return in 3 months to follow-up with Nehemiah Settle for management of her arrhythmias Normal ICD function See Arita Miss Art report No changes today   She has a very strong family history of sudden death likely due to prolonged QT.  I again had a conversation regarding the importance of screening of all of her family members for long QT.  I have encouraged baseline EKGs for all first and second degree relatives.  In addition, per HRS guidelines, I have recommended genetic testing for the patient.  I think that this would be helpful to better determine which variant of long qt she has which will help direct therapies for her.  In addition, this would be very helpful to screen her family.  She continues to decline genetic testing. Her prior EKG suggests more of a long QT 3 pattern, which often is less responsive to beta blockers. If she continues to have breakthrough episodes, we may have to consider left sympatheic ganglion denervation.  She will return for follow-up on her VT in 3 months with Nehemiah Settle if she does not move to [place] I will see in a year

## 2012-06-29 ENCOUNTER — Encounter: Payer: Medicare Other | Admitting: *Deleted

## 2012-06-30 ENCOUNTER — Encounter: Payer: Self-pay | Admitting: *Deleted

## 2012-07-14 ENCOUNTER — Encounter: Payer: Medicare Other | Admitting: Cardiology

## 2012-07-21 ENCOUNTER — Encounter: Payer: Medicare Other | Admitting: Cardiology

## 2012-08-09 ENCOUNTER — Other Ambulatory Visit: Payer: Self-pay | Admitting: Internal Medicine

## 2012-08-10 ENCOUNTER — Telehealth: Payer: Self-pay | Admitting: Internal Medicine

## 2012-08-10 MED ORDER — CARVEDILOL 25 MG PO TABS: ORAL_TABLET | ORAL | Status: DC

## 2012-08-10 NOTE — Telephone Encounter (Signed)
Called pt to confirm appt and she  Requested refill for carvedilol at [facility] on [address], pt out needs asap

## 2012-08-11 ENCOUNTER — Encounter: Payer: Self-pay | Admitting: Cardiology

## 2012-08-11 ENCOUNTER — Ambulatory Visit (INDEPENDENT_AMBULATORY_CARE_PROVIDER_SITE_OTHER): Payer: Medicare Other | Admitting: Cardiology

## 2012-08-11 VITALS — BP 160/80 | HR 65 | Ht 65.0 in | Wt 162.1 lb

## 2012-08-11 DIAGNOSIS — I2589 Other forms of chronic ischemic heart disease: Secondary | ICD-10-CM

## 2012-08-11 DIAGNOSIS — I2510 Atherosclerotic heart disease of native coronary artery without angina pectoris: Secondary | ICD-10-CM

## 2012-08-11 DIAGNOSIS — I472 Ventricular tachycardia: Secondary | ICD-10-CM

## 2012-08-11 DIAGNOSIS — I5022 Chronic systolic (congestive) heart failure: Secondary | ICD-10-CM

## 2012-08-11 DIAGNOSIS — Z95810 Presence of automatic (implantable) cardiac defibrillator: Secondary | ICD-10-CM

## 2012-08-11 DIAGNOSIS — I4581 Long QT syndrome: Secondary | ICD-10-CM

## 2012-08-11 DIAGNOSIS — I255 Ischemic cardiomyopathy: Secondary | ICD-10-CM

## 2012-08-11 LAB — ICD DEVICE OBSERVATION
DEVICE MODEL ICD: 754557
FVT: 0
RV LEAD AMPLITUDE: 12 mv
RV LEAD THRESHOLD: 1 V
VENTRICULAR PACING ICD: 0 pct

## 2012-08-11 NOTE — Progress Notes (Signed)
ELECTROPHYSIOLOGY OFFICE NOTE  Patient ID: Lori York MRN: [id number], DOB/AGE: 1945-05-31   Date of Visit: 08/16/2012  Primary Physician: Dorrene German, MD Primary Cardiologist: Johney Frame, MD Reason for Visit: EP/device follow-up  History of Present Illness  Lori York is a 67 year old woman with long QT syndrome and PMVT s/p ICD implant, ischemic CM, chronic systolic HF and CAD who presents today for routine electrophysiology followup. She is accompanied by her daughter. Since last being seen in our clinic, she reports she is doing well from a cardiovascular standpoint. She has no cardiac complaints. Today, she denies chest pain or shortness of breath/DOE. She denies palpitations, dizziness, near syncope or syncope. She denies LE swelling, orthopnea, PND or recent weight gain. Ms. Teas has h/o noncompliance but states that she is compliant now and tolerating medications without difficulty.  Of note, she reports a fall recently on Saturday while carrying her laundry to the laundry room in her apartment building. She states she tripped with the laundry basket in hand, fell while walking outside and struck her head. She denies LOC but reports a headache and neck pain today. She has no other complaints. Despite her daughter's urging, she refused to go to the ED.   Past Medical History Past Medical History  Diagnosis Date  . Long Q-T syndrome     a.  w fx of sudden cardiac arrest;  b. s/p Guidant AICD  . HTN (hypertension)   . Diabetes mellitus   . Glaucoma   . Noncompliance   . CAD (coronary artery disease)     a.  LHC 01/04/11: dLM 20%, pLAD 90%, oD1 80%, RCA 80%, EF 35-40%.;  b. 02/2011 CABG x 3: LIMA->LAD, VG->DIAG, VG->Dist RCA  . Ischemic cardiomyopathy     a. 12/2010 Echo: EF 20-25%, sept/apical AK, Sev Dil LV  . Chronic systolic heart failure   . AAA (abdominal aortic aneurysm)     a. Abd u/s 01/07/11: 5.3x5.5 cm infrarenal fusiform AAA.;  b.06/2011: CTA Abd: 6.2 cm  infrarenal AAA  . HLD (hyperlipidemia)   . Asthma   . Renal artery stenosis     a.  LHC 11/12: prox left RA 80% stenosis  . Seizures     in 20's elevated bp  . GERD (gastroesophageal reflux disease)   . Fibromyalgia   . Anemia   . Peripheral vascular disease   . Pleural effusion, left     a. s/p thoracentesis x 2 04/2011 - total of 2.5L taken off  . Sleep apnea     sleep study 2 yrs ago sehv  . Polymorphic ventricular tachycardia     s/p appropriate ICD therapy    Past Surgical History Past Surgical History  Procedure Laterality Date  . Cardiac defibrillator placement      in 1997 with revision to a  Guidant model 1860 single chamber implantable cardioverter- defib on Feb18, 2003, most recent generator change 2/11 by Dr Johney Frame SJM  . Laminectomy  May 2008  . Right knee resconstructuion  1970  . Laminectomy  11/20/07     LF5-S1 fusion  . Back surgery    . Tonsillectomy    . Abdominal hysterectomy  86  . Hemorroidectomy  77  . Coronary artery bypass graft  03/07/2011    Procedure: CORONARY ARTERY BYPASS GRAFTING (CABG);  Surgeon: Purcell Nails, MD;  Location: [facility] OR;  Service: Open Heart Surgery;  Laterality: N/A;  Coronary artery bypass graft times three using left internal mammary artery  and left leg greater saphenous vein harvested endoscopically  . Abdominal aortic aneurysm repair  07/29/2011    Procedure: ANEURYSM ABDOMINAL AORTIC REPAIR;  Surgeon: Sherren Kerns, MD;  Location: [facility] OR;  Service: Vascular;  Laterality: N/A;  Resection and grafting Abdominal Aortic Aneurysm with straight 18mm x 30cm hemasheild graft    Allergies/Intolerances Allergies  Allergen Reactions  . Tape Other (See Comments)    Pt states she struggles with removing the adhesive...   . Penicillins Rash   Current Home Medications Current Outpatient Prescriptions  Medication Sig Dispense Refill  . aspirin 325 MG EC tablet Take 325 mg by mouth daily.      . carvedilol (COREG) 25 MG tablet Take 1 1/2  tablets twice daily  90 tablet  3  . furosemide (LASIX) 40 MG tablet Take 1 tablet (40 mg total) by mouth daily.  30 tablet    . hydrALAZINE (APRESOLINE) 50 MG tablet Take 50 mg by mouth 3 (three) times daily.      . nitroGLYCERIN (NITROSTAT) 0.4 MG SL tablet Place 0.4 mg under the tongue every 5 (five) minutes as needed.      Marland Kitchen PARoxetine (PAXIL) 20 MG tablet Take 20 mg by mouth daily.       . potassium chloride SA (K-DUR,KLOR-CON) 20 MEQ tablet Take 10 mEq by mouth daily.       Marland Kitchen zolpidem (AMBIEN) 10 MG tablet Take 10 mg by mouth at bedtime as needed. For sleep        No current facility-administered medications for this visit.   Social History Social History  . Marital Status: Widowed   Social History Main Topics  . Smoking status: Former Smoker -- 0.30 packs/day    Types: Cigarettes    Quit date: 01/12/2011  . Smokeless tobacco: Never Used     Comment:    . Alcohol Use: No  . Drug Use: No   Social History Narrative   Lives in [place] with her adopted son who is 1 and a god brother, who is 63. 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     Review of Systems General: No chills, fever, night sweats or weight changes Cardiovascular: No chest pain, dyspnea on exertion, edema, orthopnea, palpitations, paroxysmal nocturnal dyspnea Dermatological: No rash, lesions or masses Respiratory: No cough, dyspnea Urologic: No hematuria, dysuria Abdominal: No nausea, vomiting, diarrhea, bright red blood per rectum, melena, or hematemesis Neurologic: No visual changes, weakness, changes in mental status All other systems reviewed and are otherwise negative except as noted above.  Physical Exam Blood pressure 160/80, pulse 65, height 5\' 5"  (1.651 m), weight 162 lb 1.9 oz (73.537 kg), SpO2 98.00%.  General: Well developed, well appearing 67 year old female in no acute distress. HEENT: Normocephalic, atraumatic. EOMs intact. Sclera nonicteric.  Oropharynx clear.  Neck: Supple. No JVD. Lungs: Respirations regular and unlabored, CTA bilaterally. No wheezes, rales or rhonchi. Heart: RRR. S1, S2 present. No murmurs, rub, S3 or S4. Abdomen: Soft, non-distended.  Extremities: No clubbing, cyanosis or edema. PT/Radials 2+ and equal bilaterally. Psych: Normal affect. Neuro: Alert and oriented X 3. Moves all extremities spontaneously.   Diagnostics Device interrogation today - Normal device function. Thresholds and sensing consistent with previous device measurements. Impedance trends stable over time. No evidence of any ventricular arrhythmias. Histogram distribution appropriate for patient and level of activity. No changes made this session. Device programmed at appropriate safety margins. Device programmed to optimize  intrinsic conduction. Estimated longevity 6.9 years.   Assessment and Plan 1. Long QT syndrome with prior VT arrest s/p ICD implant Normal device function No ventricular arrhythmias No programming changes made Continue routine ICD/device follow-up every 3 months Return for follow-up with Dr. Johney Frame in 9 months 2. Ischemic CM with chronic systolic HF Stable; euvolemic by exam today Continue medical therapy Counseled regarding the importance of medication compliance 3. CAD Stable without anginal symptoms Continue medical therapy 4. Recent fall With head injury 3 days ago, now with headache Instructed her to go to [facility] ED for evaluation however she refused Counseled her regarding the need for emergent evaluation if her HA persists, becomes severe or is accompanied by other neurologic symptoms She agreed to f/u with her PCP in the next 24-48 hours  Signed, Rick Duff, PA-C 08/16/2012, 9:37 PM

## 2012-08-13 ENCOUNTER — Telehealth: Payer: Self-pay | Admitting: Internal Medicine

## 2012-08-13 NOTE — Telephone Encounter (Signed)
I have asked her to contact her PCP for this letter

## 2012-08-13 NOTE — Telephone Encounter (Signed)
New Prob     Pt is needing a letter stating her dog is a medical dog and needed for her care. Please call.

## 2012-08-16 ENCOUNTER — Encounter: Payer: Self-pay | Admitting: Cardiology

## 2012-09-14 ENCOUNTER — Other Ambulatory Visit: Payer: Self-pay | Admitting: Nurse Practitioner

## 2012-09-28 ENCOUNTER — Other Ambulatory Visit: Payer: Self-pay | Admitting: Nurse Practitioner

## 2012-10-19 ENCOUNTER — Encounter: Payer: Self-pay | Admitting: Internal Medicine

## 2012-11-11 ENCOUNTER — Encounter: Payer: Self-pay | Admitting: Vascular Surgery

## 2012-11-12 ENCOUNTER — Other Ambulatory Visit: Payer: Medicare Other

## 2012-11-12 ENCOUNTER — Ambulatory Visit: Payer: Medicare Other | Admitting: Vascular Surgery

## 2012-11-13 ENCOUNTER — Encounter: Payer: Self-pay | Admitting: Internal Medicine

## 2012-11-25 ENCOUNTER — Encounter: Payer: Self-pay | Admitting: Vascular Surgery

## 2012-11-26 ENCOUNTER — Inpatient Hospital Stay: Admission: RE | Admit: 2012-11-26 | Payer: Medicare Other | Source: Ambulatory Visit

## 2012-11-26 ENCOUNTER — Ambulatory Visit: Payer: Medicare Other | Admitting: Vascular Surgery

## 2012-12-08 ENCOUNTER — Other Ambulatory Visit: Payer: Self-pay

## 2012-12-08 MED ORDER — CARVEDILOL 25 MG PO TABS: ORAL_TABLET | ORAL | Status: DC

## 2013-01-20 ENCOUNTER — Encounter: Payer: Self-pay | Admitting: Vascular Surgery

## 2013-01-20 IMAGING — CR DG CHEST 1V PORT
1 series · 1 of 1 positions shown · non-contrast
Comparison: 03/05/2011.

CLINICAL DATA: 65-year-old female status post open heart surgery.

PORTABLE CHEST - 1 VIEW

[view not recorded]
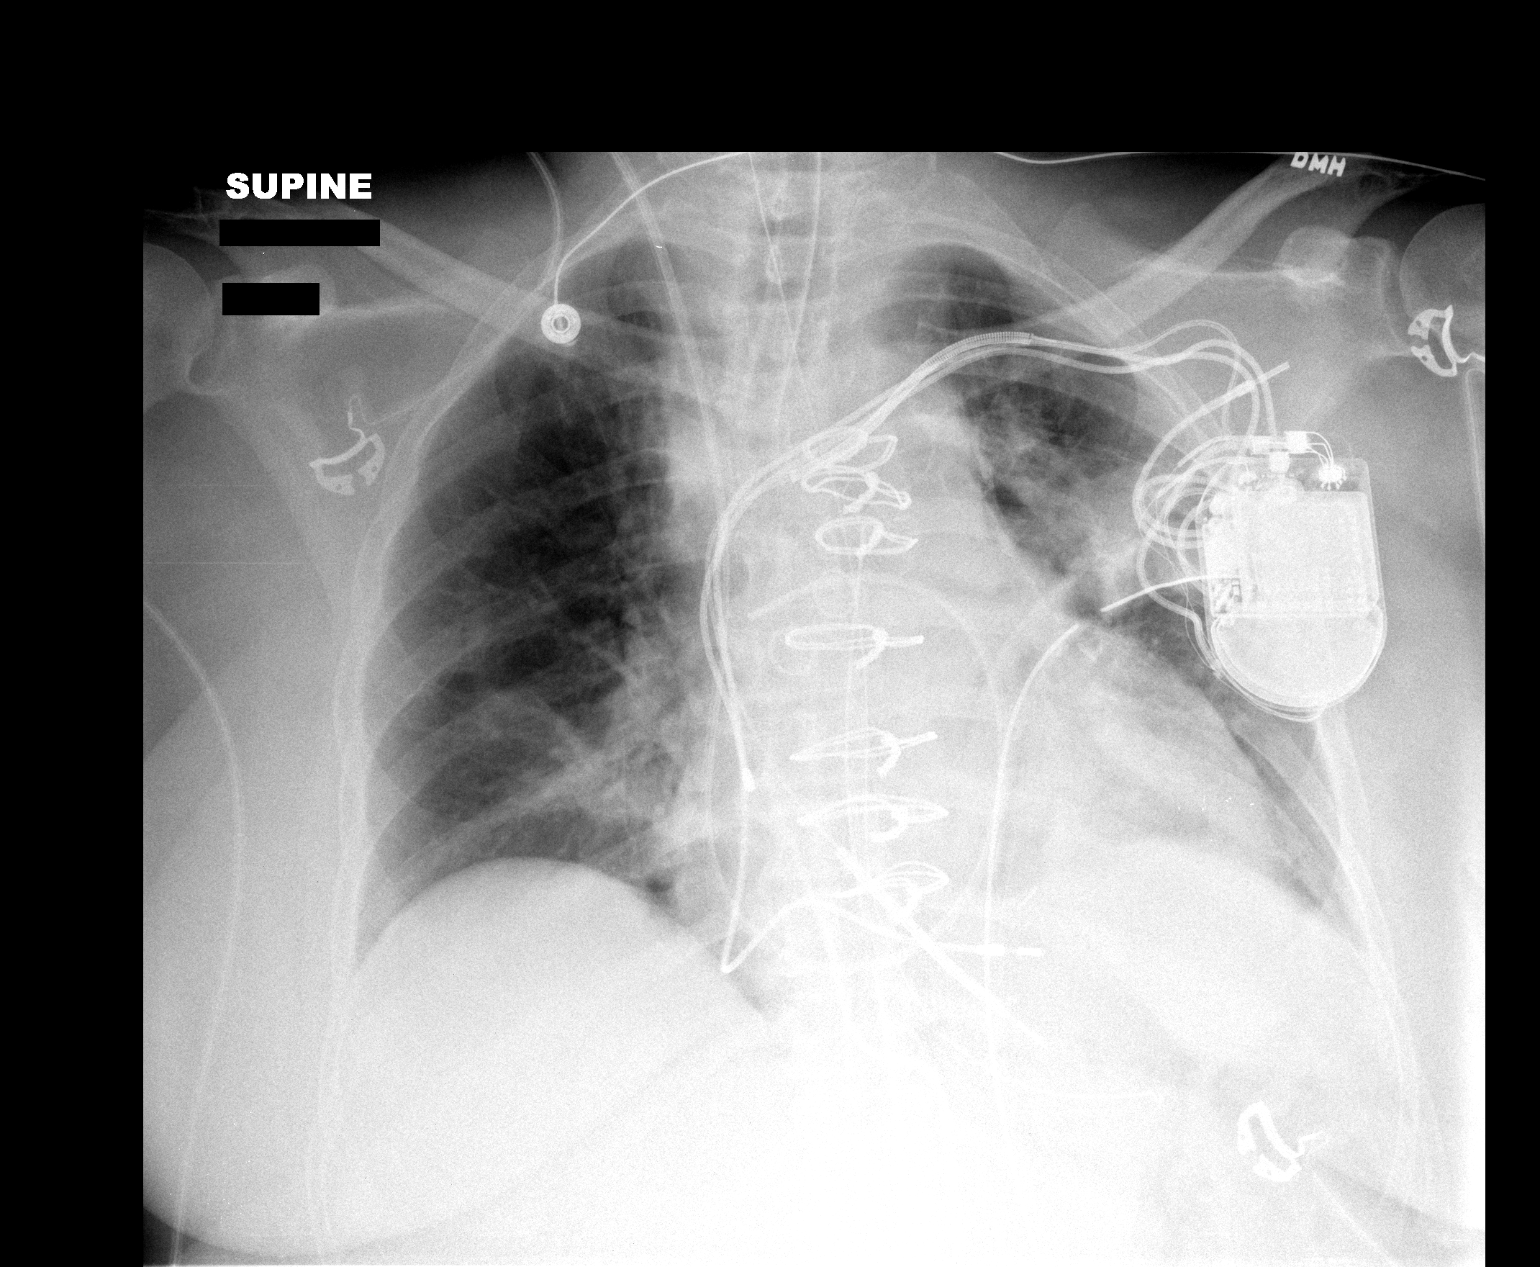

[1 of 1 positions shown; findings below may reference images not displayed]

FINDINGS: Portable AP supine view 0127 hours.

Endotracheal tube tip in good position between level of clavicles
and carina.  Right IJ approach Swan-Ganz catheter, tip the level of
the right main pulmonary artery.  Enteric tube courses to the left
upper quadrant.  Left chest tube and two mediastinal drains in
place.

Stable left chest cardiac AICD.  New sequelae of CABG and median
sternotomy wires.  Lower lung volumes.  Perihilar and left basilar
confluent opacity.  No pneumothorax, pulmonary edema or effusion.
IMPRESSION: 1.  Lines and tubes appear adequately placed as above.  No
pneumothorax.
2.  Low lung volumes with perihilar and basilar atelectasis.

## 2013-01-21 ENCOUNTER — Inpatient Hospital Stay: Admission: RE | Admit: 2013-01-21 | Payer: Medicare Other | Source: Ambulatory Visit

## 2013-01-21 ENCOUNTER — Ambulatory Visit: Payer: Medicare Other | Admitting: Vascular Surgery

## 2013-01-21 IMAGING — CR DG CHEST 1V PORT
1 series · 1 of 1 positions shown · non-contrast
Comparison: 03/07/2011

CLINICAL DATA: Postop.

PORTABLE CHEST - 1 VIEW

[AP]
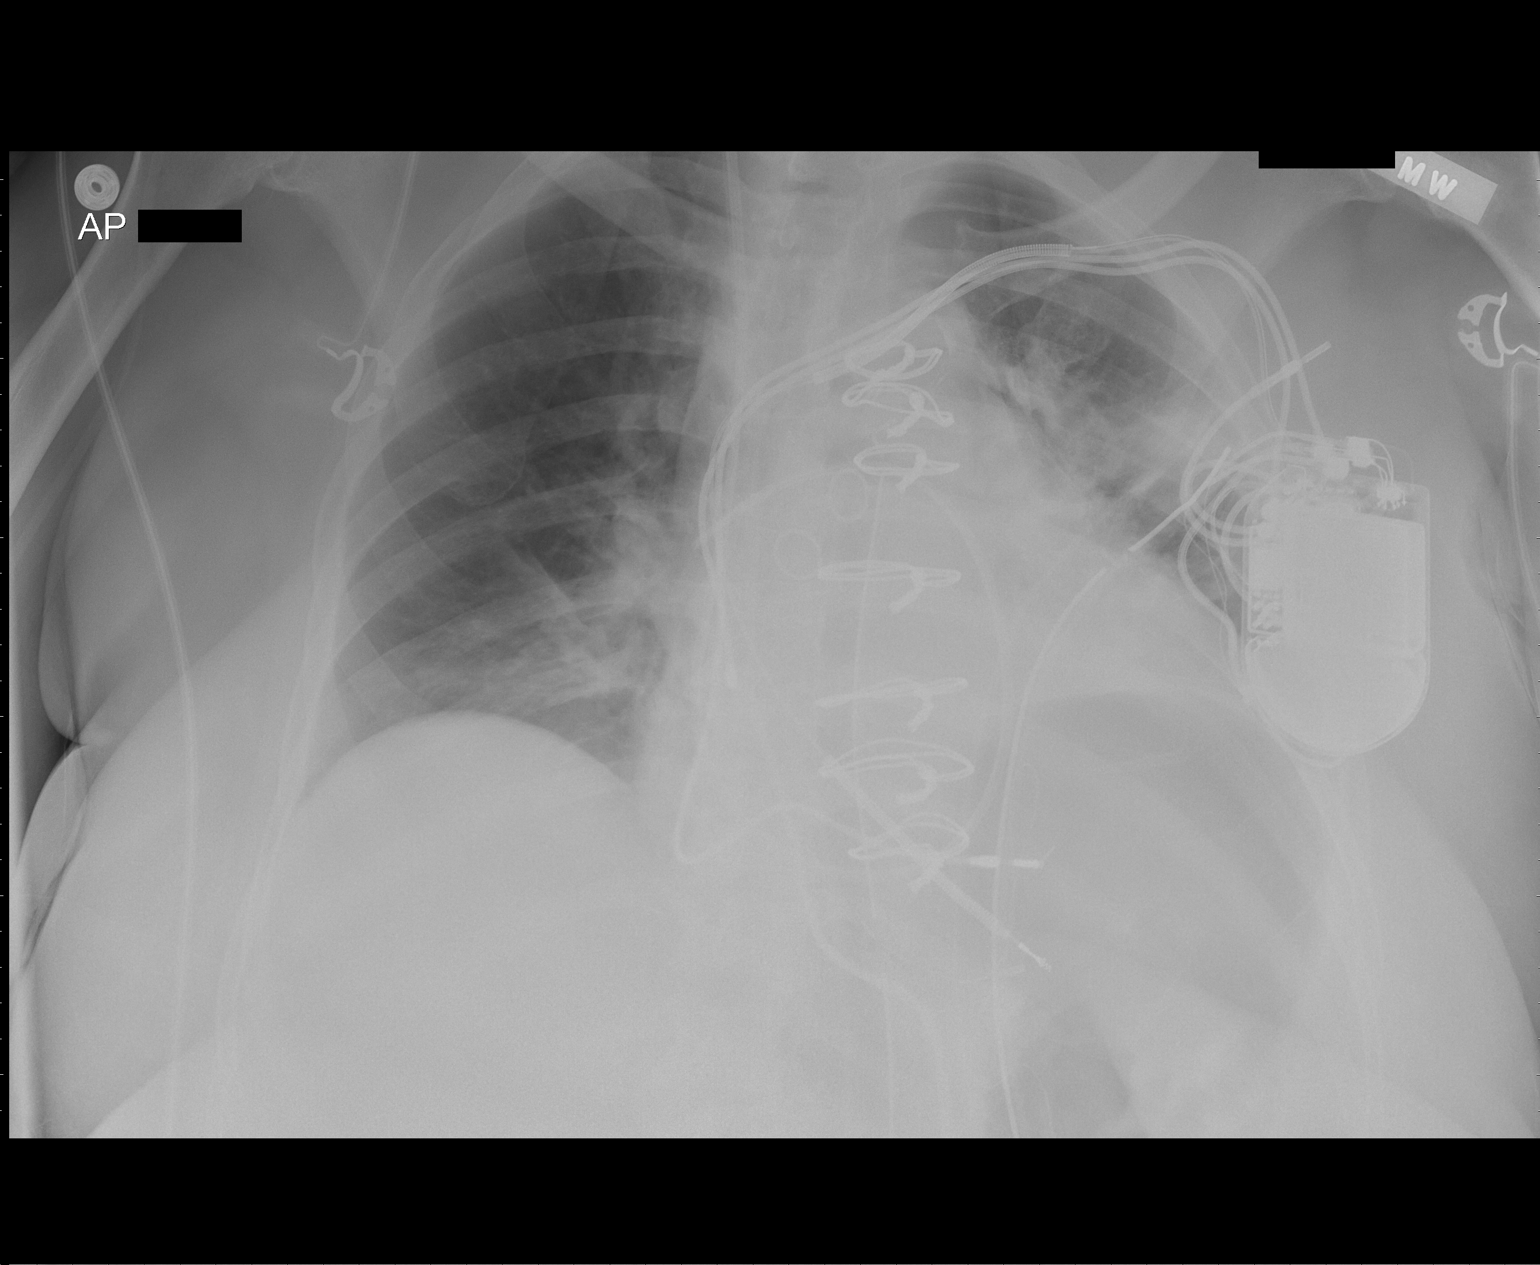

[1 of 1 positions shown; findings below may reference images not displayed]

FINDINGS: Interval removal of NG tube and endotracheal tube.  Swan-
Ganz catheter remains in stable position as does the left chest
tube.  Left AICD unchanged.  Tiny left apical pneumothorax noted.
Patchy left lung and right basilar airspace disease, presumably
atelectasis.  No effusions.  Stable cardiomegaly.  Low lung
volumes.
IMPRESSION: Interval extubation.  Low lung volumes with patchy bilateral
opacities, likely atelectasis.

Tiny left apical pneumothorax.

## 2013-01-22 IMAGING — CR DG CHEST 1V PORT
1 series · 1 of 1 positions shown · non-contrast
Comparison: 03/08/2011 and 03/07/2011.

CLINICAL DATA: Postop cardiac surgery.

PORTABLE CHEST - 1 VIEW

[view not recorded]
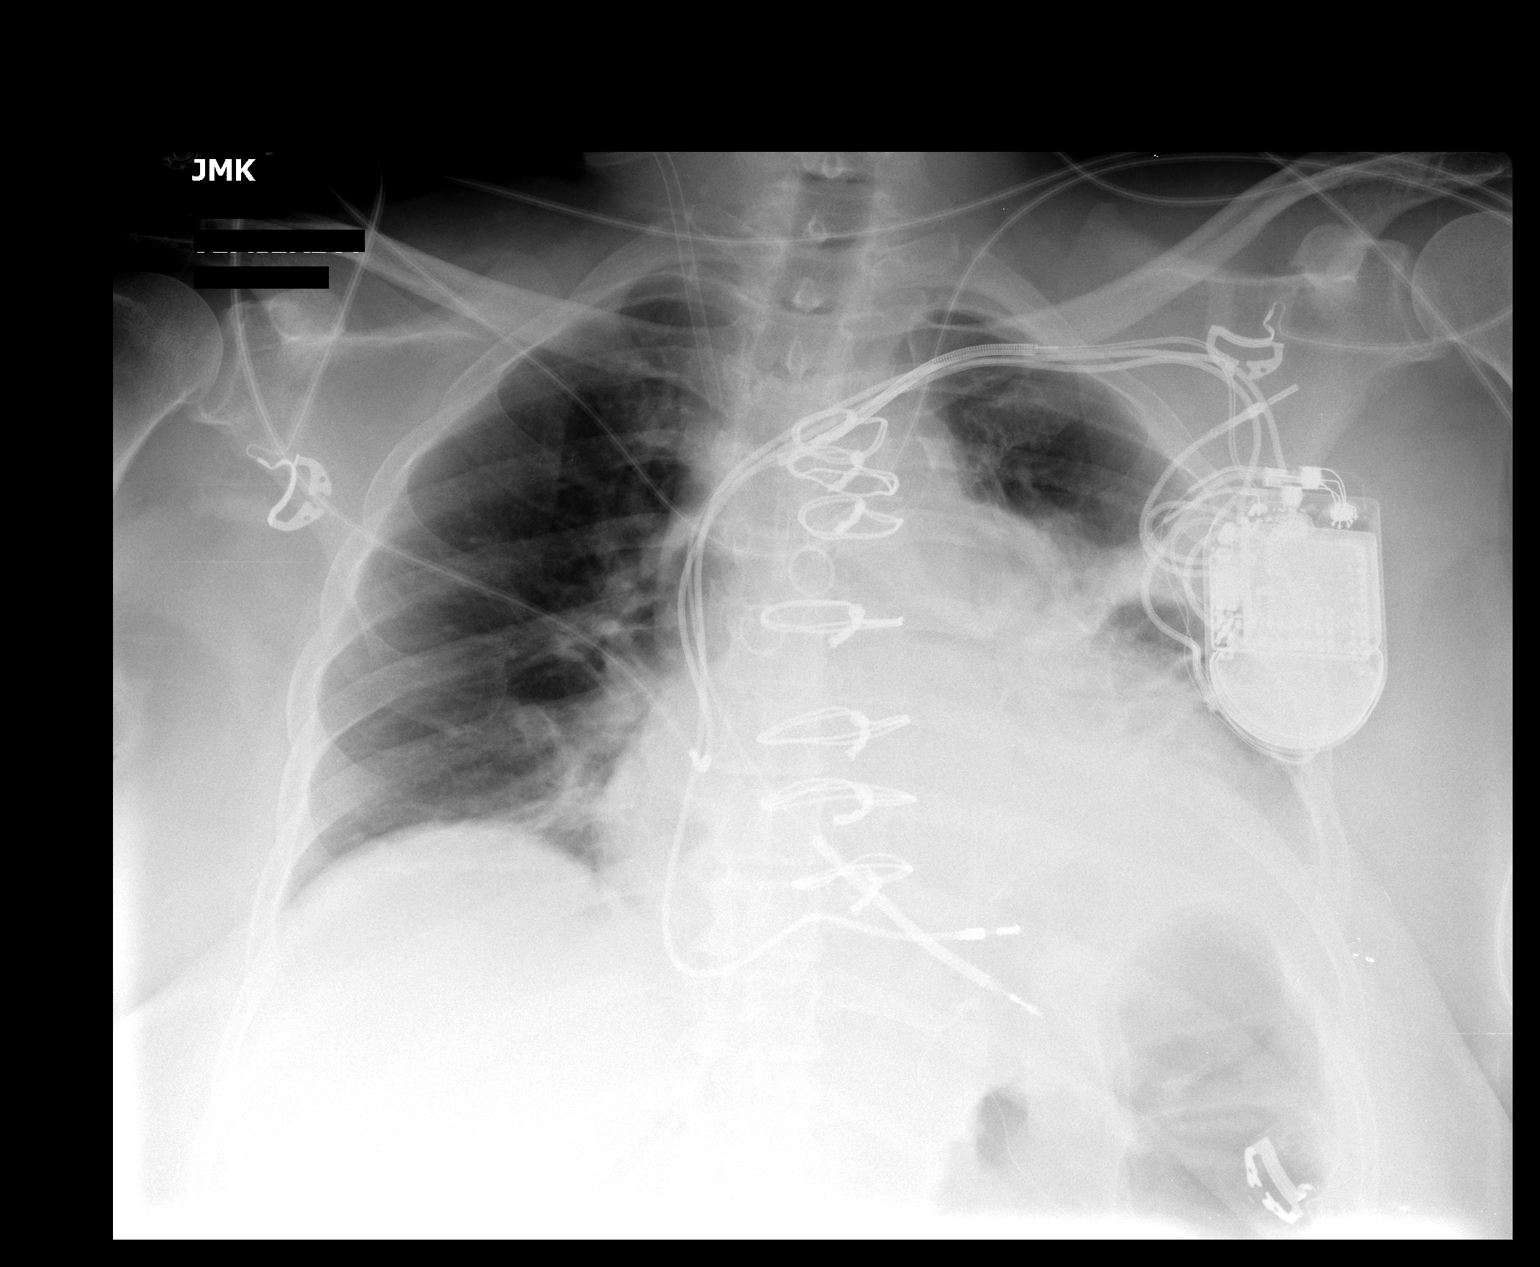

[1 of 1 positions shown; findings below may reference images not displayed]

FINDINGS: 1911 hours.  Swan-Ganz catheter, left chest tube and
mediastinal drain have been removed in the interval.  Right IJ
sheath remains in place. The left-sided pacemaker leads appear
unchanged.  Cardiomegaly, bibasilar atelectasis and a possible
small left pleural effusion remain.  There is no pneumothorax.
IMPRESSION: Stable examination following partial removal of the support system.
No pneumothorax identified.

## 2013-01-25 IMAGING — CR DG CHEST 2V
2 series · 2 of 2 positions shown · non-contrast
Comparison: 03/09/2011

CLINICAL DATA: Postop CABG.

CHEST - 2 VIEW

[w chest pa]
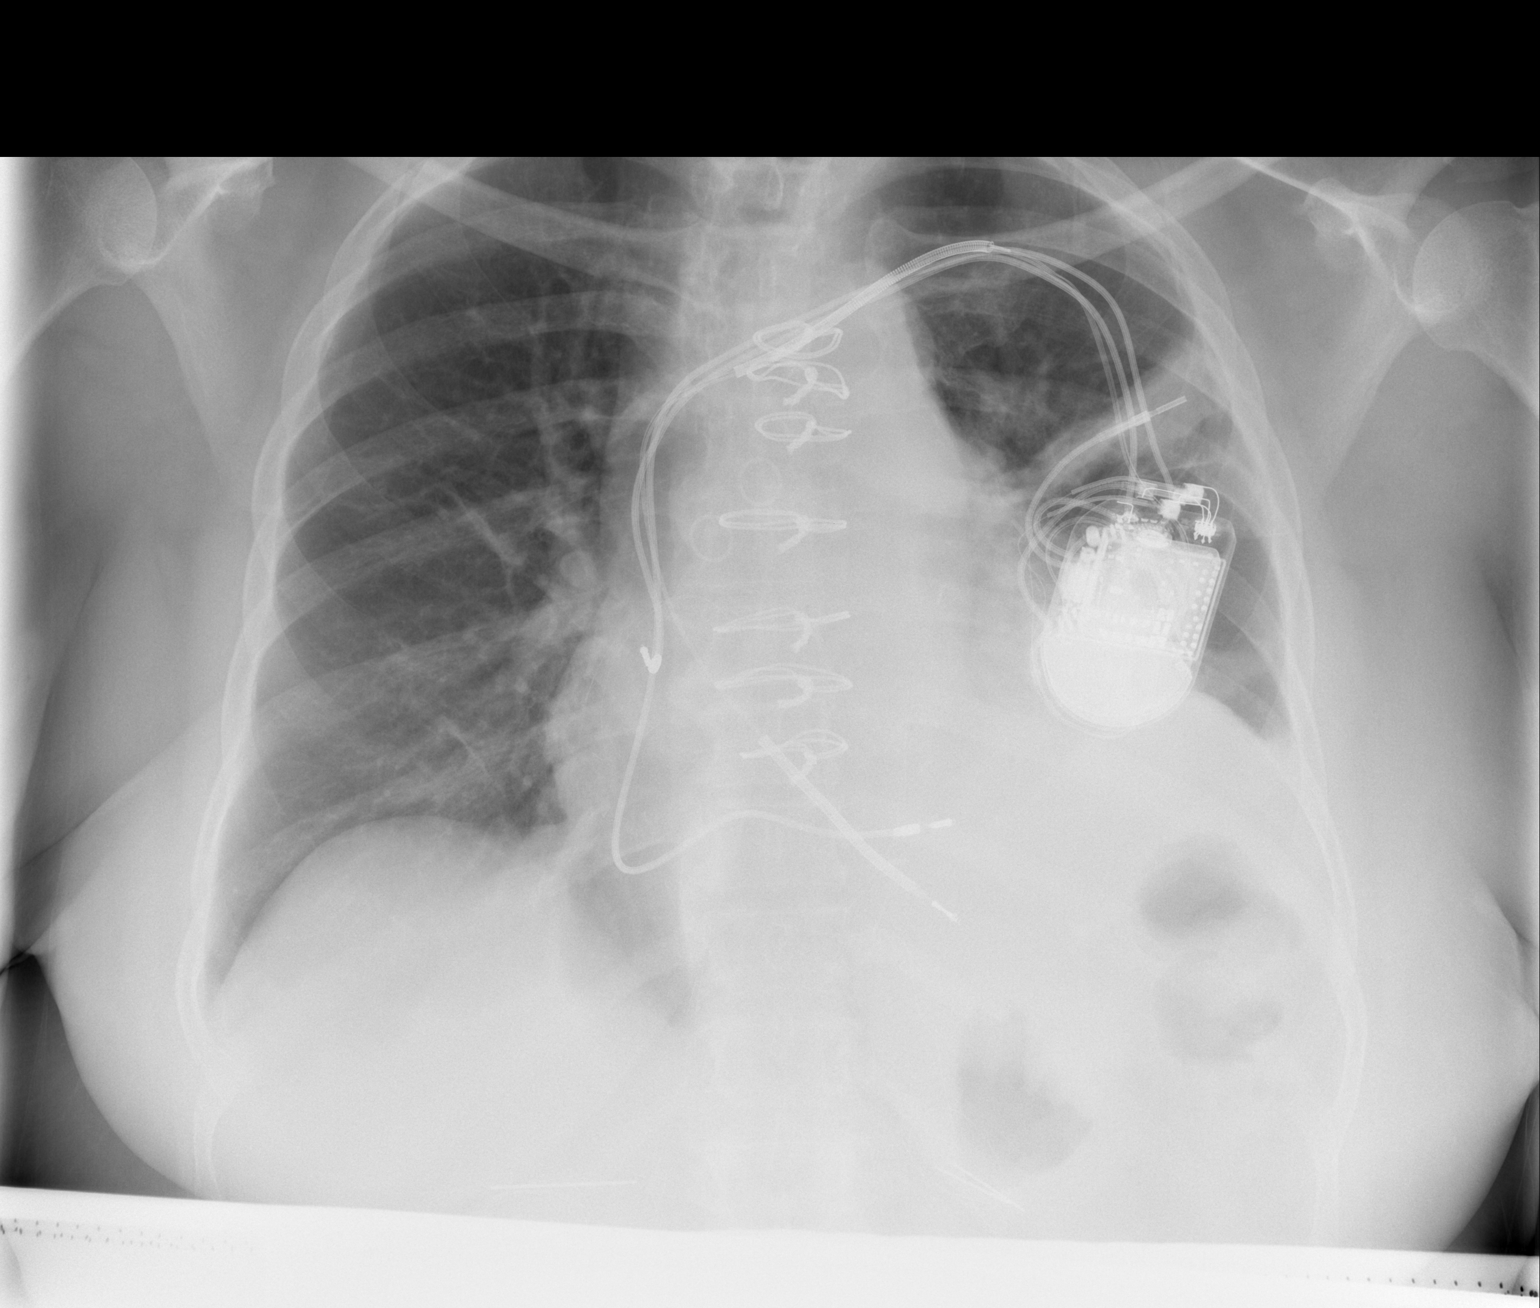

[w chest lat]
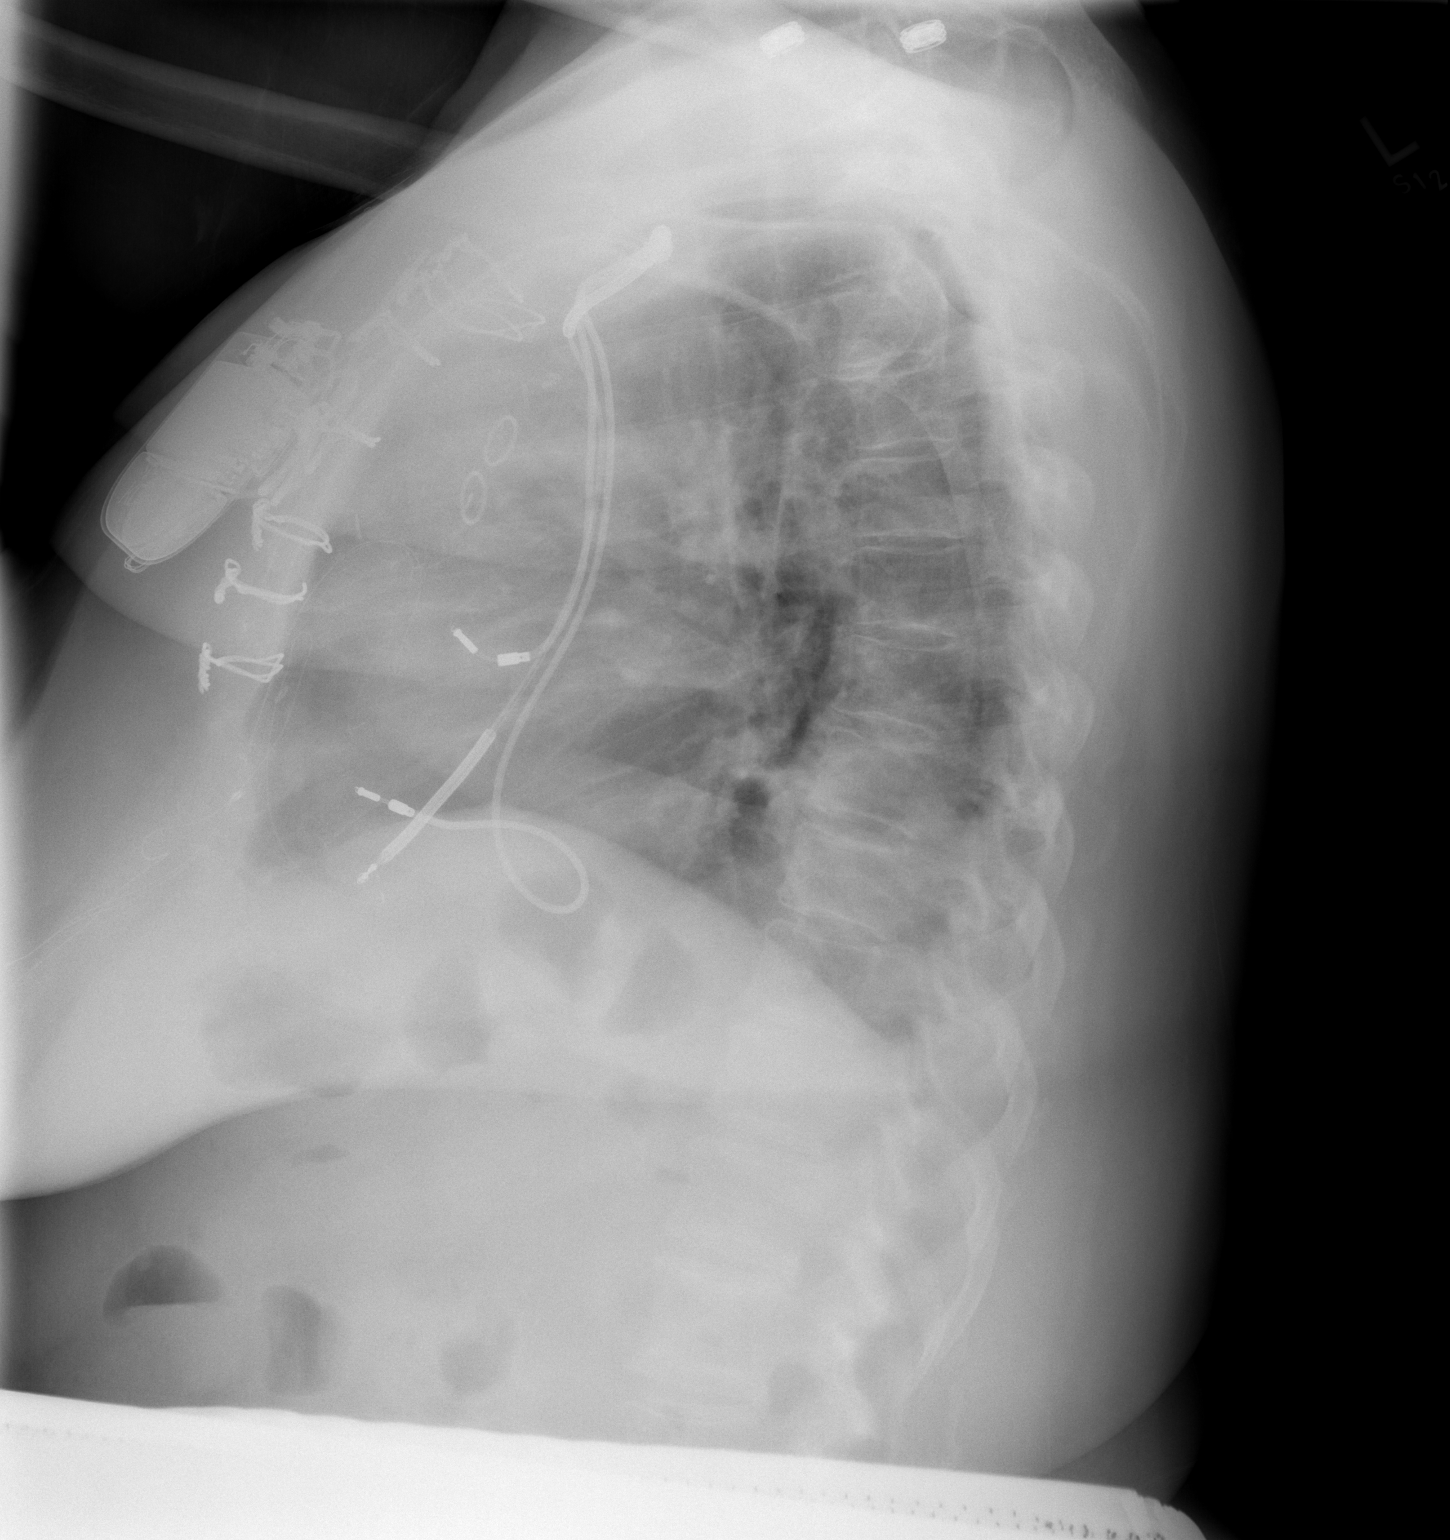

[2 of 2 positions shown; findings below may reference images not displayed]

FINDINGS: Prior CABG.  Left pacer remains in place, unchanged.,
slightly improved.  Improving right basilar atelectasis.  Small
left effusion, stable.
IMPRESSION: Improving aeration in the lung bases.

## 2013-01-27 ENCOUNTER — Encounter: Payer: Self-pay | Admitting: Vascular Surgery

## 2013-01-28 ENCOUNTER — Encounter: Payer: Self-pay | Admitting: Vascular Surgery

## 2013-01-28 ENCOUNTER — Ambulatory Visit (INDEPENDENT_AMBULATORY_CARE_PROVIDER_SITE_OTHER): Payer: Medicare Other | Admitting: Vascular Surgery

## 2013-01-28 ENCOUNTER — Encounter (INDEPENDENT_AMBULATORY_CARE_PROVIDER_SITE_OTHER): Payer: Self-pay

## 2013-01-28 VITALS — BP 143/68 | HR 64 | Ht 65.0 in | Wt 163.5 lb

## 2013-01-28 DIAGNOSIS — Z48812 Encounter for surgical aftercare following surgery on the circulatory system: Secondary | ICD-10-CM

## 2013-01-28 DIAGNOSIS — R109 Unspecified abdominal pain: Secondary | ICD-10-CM

## 2013-01-28 DIAGNOSIS — I714 Abdominal aortic aneurysm, without rupture: Secondary | ICD-10-CM

## 2013-01-28 NOTE — Progress Notes (Signed)
Patient is a 67 year old female who is status post open abdominal aortic aneurysm repair June of 2013. She returns today for followup. She states that her appetite is poor but she is gaining weight. She had a small area around her umbilicus that had not healed completely but was healed at her last postoperative visit. She states that this has been draining intermittently since that visit the. She reports that overall she feels tired. She denies any chills. She states her walking distance is good.  Review of systems: She denies shortness of breath. She denies chest pain.  Physical exam:     Filed Vitals:   01/28/13 1458  BP: 143/68  Pulse: 64  Height: 5\' 5"  (1.651 m)  Weight: 163 lb 8 oz (74.163 kg)  SpO2: 97%   Abdomen: Soft nontender nondistended well-healed midline laparotomy incision with no evidence of hernia 2+ femoral pulses bilaterally, small amount of moisture in the umbilicus no obvious sinus tract foul-smelling  Extremities: no significant edema  Assessment: Doing well status post abdominal aortic aneurysm repair no hernia defect but drainage from umbilicus is concerning for possible fistula.  Point: Followup with me after CT abdomen and pelvis with oral and IV contrast  [name], MD [facility] [place] Office: [phone] Pager: [phone]

## 2013-02-15 ENCOUNTER — Other Ambulatory Visit: Payer: Self-pay | Admitting: Vascular Surgery

## 2013-02-15 LAB — BUN: BUN: 22 mg/dL (ref 6–23)

## 2013-02-15 LAB — CREATININE, SERUM: CREATININE: 1.26 mg/dL — AB (ref 0.50–1.10)

## 2013-02-17 ENCOUNTER — Encounter: Payer: Self-pay | Admitting: Vascular Surgery

## 2013-02-18 ENCOUNTER — Inpatient Hospital Stay: Admission: RE | Admit: 2013-02-18 | Payer: 59 | Source: Ambulatory Visit

## 2013-02-18 ENCOUNTER — Ambulatory Visit: Payer: 59 | Admitting: Vascular Surgery

## 2013-02-24 ENCOUNTER — Encounter: Payer: Self-pay | Admitting: Vascular Surgery

## 2013-02-24 ENCOUNTER — Other Ambulatory Visit: Payer: 59

## 2013-02-25 ENCOUNTER — Ambulatory Visit
Admission: RE | Admit: 2013-02-25 | Discharge: 2013-02-25 | Disposition: A | Discharge status: Home / Self Care | Payer: PRIVATE HEALTH INSURANCE | Source: Ambulatory Visit | Attending: Vascular Surgery | Admitting: Vascular Surgery

## 2013-02-25 ENCOUNTER — Ambulatory Visit (INDEPENDENT_AMBULATORY_CARE_PROVIDER_SITE_OTHER): Payer: PRIVATE HEALTH INSURANCE | Admitting: Vascular Surgery

## 2013-02-25 ENCOUNTER — Encounter: Payer: Self-pay | Admitting: Vascular Surgery

## 2013-02-25 VITALS — BP 187/90 | HR 71 | Resp 18 | Ht 64.0 in | Wt 171.0 lb

## 2013-02-25 DIAGNOSIS — R109 Unspecified abdominal pain: Secondary | ICD-10-CM | POA: Insufficient documentation

## 2013-02-25 DIAGNOSIS — I714 Abdominal aortic aneurysm, without rupture, unspecified: Secondary | ICD-10-CM

## 2013-02-25 DIAGNOSIS — I77810 Thoracic aortic ectasia: Secondary | ICD-10-CM

## 2013-02-25 DIAGNOSIS — Z48812 Encounter for surgical aftercare following surgery on the circulatory system: Secondary | ICD-10-CM

## 2013-02-25 MED ORDER — IOHEXOL 300 MG/ML  SOLN
100.0000 mL | Freq: Once | INTRAMUSCULAR | Status: AC | PRN
  Administered 2013-02-25: 100 mL via INTRAVENOUS

## 2013-02-25 NOTE — Addendum Note (Signed)
Addended by: Sharee Pimple on: 02/25/2013 04:17 PM   Modules accepted: Orders

## 2013-02-25 NOTE — Progress Notes (Signed)
Patient is a 68 year old female who is status post open abdominal aortic aneurysm repair June of 2013. She returns today for followup. She has a periumbilical area that has been draining intermittently. She reports that overall she feels tired. She denies any chills. She states her walking distance is good.  Review of systems: She denies shortness of breath. She denies chest pain.  Physical exam:       Filed Vitals:   02/25/13 1508  BP: 187/90  Pulse: 71  Resp: 18  Height: 5\' 4"  (1.626 m)  Weight: 171 lb (77.565 kg)   Abdomen: Soft nontender nondistended well-healed midline laparotomy incision with no evidence of hernia 2+ femoral pulses bilaterally, small amount of moisture in the umbilicus no obvious sinus tract foul-smelling  Extremities: no significant edema  Data: CT scan of the abdomen and pelvis is reviewed today. There is a small periumbilical hernia defect containing fat no obvious connection to bowel. Also noted ectasia of the descending thoracic aorta and a thoracic CT scan was recommended to rule out aneurysm and the chest. Of note she also had significant retained stool.  Assessment: Doing well status post abdominal aortic aneurysm repair with chronic draining sinus periumbilical hernia.  Constipation Plan: The patient will return for followup in 6 weeks. She wishes to think about whether or not she once to have the area of hernia defect repaired. We will also obtain a CT scan of the chest to evaluate for thoracic aneurysm. She was prescribed 2 fleets enemas today to try to deal with her constipation symptoms.  [name], MD [facility] [place] Office: [phone] Pager: [phone]

## 2013-03-05 ENCOUNTER — Encounter: Payer: Self-pay | Admitting: Gastroenterology

## 2013-03-18 ENCOUNTER — Other Ambulatory Visit: Payer: Self-pay

## 2013-03-18 DIAGNOSIS — Z1231 Encounter for screening mammogram for malignant neoplasm of breast: Secondary | ICD-10-CM

## 2013-03-30 ENCOUNTER — Ambulatory Visit: Payer: PRIVATE HEALTH INSURANCE

## 2013-04-08 ENCOUNTER — Ambulatory Visit: Payer: PRIVATE HEALTH INSURANCE | Admitting: Vascular Surgery

## 2013-04-08 ENCOUNTER — Other Ambulatory Visit: Payer: PRIVATE HEALTH INSURANCE

## 2013-04-12 ENCOUNTER — Ambulatory Visit: Payer: PRIVATE HEALTH INSURANCE

## 2013-04-14 ENCOUNTER — Encounter: Payer: PRIVATE HEALTH INSURANCE | Admitting: Gastroenterology

## 2013-04-14 ENCOUNTER — Encounter: Payer: Self-pay | Admitting: *Deleted

## 2013-05-10 ENCOUNTER — Other Ambulatory Visit: Payer: Self-pay | Admitting: Internal Medicine

## 2013-06-03 ENCOUNTER — Other Ambulatory Visit: Payer: Self-pay | Admitting: Nurse Practitioner

## 2013-06-15 ENCOUNTER — Telehealth: Payer: Self-pay | Admitting: Internal Medicine

## 2013-06-15 ENCOUNTER — Encounter: Payer: Self-pay | Admitting: Internal Medicine

## 2013-06-15 NOTE — Telephone Encounter (Signed)
04-29-13 lmm @ 1049am to set up past due defib ck with allred ,was due in february/mt 06-15-13 SENT PAST DUE LETTER/MT

## 2013-07-12 ENCOUNTER — Telehealth: Payer: Self-pay

## 2013-07-12 DIAGNOSIS — I77810 Thoracic aortic ectasia: Secondary | ICD-10-CM

## 2013-07-12 DIAGNOSIS — I714 Abdominal aortic aneurysm, without rupture, unspecified: Secondary | ICD-10-CM

## 2013-07-12 DIAGNOSIS — R140 Abdominal distension (gaseous): Secondary | ICD-10-CM

## 2013-07-12 DIAGNOSIS — R109 Unspecified abdominal pain: Secondary | ICD-10-CM

## 2013-07-12 NOTE — Telephone Encounter (Addendum)
Phone call from pt.  Reports several mo. hx of abdominal bloating.  Reports that she feels it is getting worse.  Said that Dr. Darrick Penna told her it could be a hernia.  Reports that only able to eat one meal/ day.  Denies any nausea or vomiting.  States that last night she was doubled-over with pain, and thought about going to the ER, "because I felt so bad".  Reports that she has had problems with constipation about 2 weeks, and took Magnesium Citrate.  States she is oozing loose stool.  Denies nausea or vomiting.  Reports she feels hot and then cold.  States she missed her f/u appt. in February due to transportation problems.  Requesting to see Dr. Darrick Penna as soon as possible. Discussed with Dr. Myra Gianotti.  Recommended to order CTA Chest/ Abdomen/ Pelvis to evaluate for Thoracic Aneurysm, and SMA stenosis.

## 2013-07-13 ENCOUNTER — Other Ambulatory Visit: Payer: Self-pay | Admitting: Vascular Surgery

## 2013-07-13 LAB — CREATININE, SERUM: Creat: 1.57 mg/dL — ABNORMAL HIGH (ref 0.50–1.35)

## 2013-07-13 LAB — BUN: BUN: 23 mg/dL (ref 6–23)

## 2013-07-14 ENCOUNTER — Ambulatory Visit
Admission: RE | Admit: 2013-07-14 | Discharge: 2013-07-14 | Disposition: A | Discharge status: Home / Self Care | Payer: PRIVATE HEALTH INSURANCE | Source: Ambulatory Visit | Attending: Vascular Surgery | Admitting: Vascular Surgery

## 2013-07-14 ENCOUNTER — Other Ambulatory Visit: Payer: PRIVATE HEALTH INSURANCE

## 2013-07-14 ENCOUNTER — Inpatient Hospital Stay: Admission: RE | Admit: 2013-07-14 | Payer: PRIVATE HEALTH INSURANCE | Source: Ambulatory Visit

## 2013-07-14 ENCOUNTER — Encounter: Payer: Self-pay | Admitting: Vascular Surgery

## 2013-07-14 DIAGNOSIS — R109 Unspecified abdominal pain: Secondary | ICD-10-CM

## 2013-07-14 DIAGNOSIS — I77810 Thoracic aortic ectasia: Secondary | ICD-10-CM

## 2013-07-14 DIAGNOSIS — I714 Abdominal aortic aneurysm, without rupture, unspecified: Secondary | ICD-10-CM

## 2013-07-14 DIAGNOSIS — R140 Abdominal distension (gaseous): Secondary | ICD-10-CM

## 2013-07-14 MED ORDER — IOHEXOL 350 MG/ML SOLN
60.0000 mL | Freq: Once | INTRAVENOUS | Status: AC | PRN
  Administered 2013-07-14: 60 mL via INTRAVENOUS

## 2013-07-15 ENCOUNTER — Ambulatory Visit (INDEPENDENT_AMBULATORY_CARE_PROVIDER_SITE_OTHER): Payer: PRIVATE HEALTH INSURANCE | Admitting: Vascular Surgery

## 2013-07-15 ENCOUNTER — Encounter: Payer: Self-pay | Admitting: Vascular Surgery

## 2013-07-15 VITALS — BP 148/80 | HR 70 | Temp 98.2°F | Ht 64.0 in | Wt 171.0 lb

## 2013-07-15 DIAGNOSIS — I714 Abdominal aortic aneurysm, without rupture, unspecified: Secondary | ICD-10-CM | POA: Insufficient documentation

## 2013-07-15 DIAGNOSIS — I712 Thoracic aortic aneurysm, without rupture, unspecified: Secondary | ICD-10-CM | POA: Insufficient documentation

## 2013-07-15 NOTE — Progress Notes (Signed)
[facility] [place] HISTORY AND PHYSICAL   History of Present Illness:   Patient is a 68 year old female who is status post open abdominal aortic aneurysm repair June of 2013. This was a tube graft. She returns today for followup. She has a periumbilical area that had been draining intermittently. This is now resolved.  She states her walking distance is good. She denies any abdominal or back pain. CT scan of the abdomen showed a small periumbilical hernia. She also returns today with a followup CT scan of the chest for further evaluation of an ectatic descending thoracic aorta.  Other chronic medical problems include history of AICD, hypertension, diabetes, coronary disease status post grafting, ischemic cardiomyopathy EF 20-25% and 2012, hyperlipidemia, asthma, sleep apnea all which are currently stable.  Past Medical History  Diagnosis Date  . Long Q-T syndrome     a.  w fx of sudden cardiac arrest;  b. s/p Guidant AICD  . HTN (hypertension)   . Diabetes mellitus   . Glaucoma   . Noncompliance   . CAD (coronary artery disease)     a.  LHC 01/04/11: dLM 20%, pLAD 90%, oD1 80%, RCA 80%, EF 35-40%.;  b. 02/2011 CABG x 3: LIMA->LAD, VG->DIAG, VG->Dist RCA  . Ischemic cardiomyopathy     a. 12/2010 Echo: EF 20-25%, sept/apical AK, Sev Dil LV  . Chronic systolic heart failure   . AAA (abdominal aortic aneurysm)     a. Abd u/s 01/07/11: 5.3x5.5 cm infrarenal fusiform AAA.;  b.06/2011: CTA Abd: 6.2 cm infrarenal AAA  . HLD (hyperlipidemia)   . Asthma   . Renal artery stenosis     a.  LHC 11/12: prox left RA 80% stenosis  . Seizures     in 20's elevated bp  . GERD (gastroesophageal reflux disease)   . Fibromyalgia   . Anemia   . Peripheral vascular disease   . Pleural effusion, left     a. s/p thoracentesis x 2 04/2011 - total of 2.5L taken off  . Sleep apnea     sleep study 2 yrs ago sehv  . Polymorphic ventricular tachycardia     s/p appropriate ICD therapy     Past Surgical History  Procedure Laterality Date  . Cardiac defibrillator placement      in 1997 with revision to a  Guidant model 1860 single chamber implantable cardioverter- defib on Feb18, 2003, most recent generator change 2/11 by Dr Johney FrameAllred SJM  . Laminectomy  May 2008  . Right knee resconstructuion  1970  . Laminectomy  11/20/07     LF5-S1 fusion  . Back surgery    . Tonsillectomy    . Abdominal hysterectomy  86  . Hemorroidectomy  75  . Coronary artery bypass graft  03/07/2011    Procedure: CORONARY ARTERY BYPASS GRAFTING (CABG);  Surgeon: Purcell Nailslarence H Owen, MD;  [facility];  Service: Open Heart Surgery;  Laterality: N/A;  Coronary artery bypass graft times three using left internal mammary artery and left leg greater saphenous vein harvested endoscopically  . Abdominal aortic aneurysm repair  07/29/2011    Procedure: ANEURYSM ABDOMINAL AORTIC REPAIR;  Surgeon: Sherren Kernsharles E Fields, MD;  [facility];  Service: Vascular;  Laterality: N/A;  Resection and grafting Abdominal Aortic Aneurysm with straight 18mm x 30cm hemasheild graft    Social History History  Substance Use Topics  . Smoking status: Former Smoker -- 0.30 packs/day    Types: Cigarettes    Quit date: 01/12/2011  .  Smokeless tobacco: Never Used     Comment:    . Alcohol Use: No    Family History Family History  Problem Relation Age of Onset  . Long QT syndrome Mother   . Heart disease Mother     before age 56  . Hypertension Mother   . Long QT syndrome Sister   . Heart disease Sister   . Long QT syndrome Brother   . Heart disease Brother   . Heart attack Brother   . Long QT syndrome Daughter   . Heart disease Daughter   . Coronary artery disease Sister 10    Allergies  Allergies  Allergen Reactions  . Tape Other (See Comments)    Pt states she struggles with removing the adhesive...   . Penicillins Rash     Current Outpatient Prescriptions  Medication Sig Dispense Refill  . aspirin 325  MG EC tablet Take 325 mg by mouth daily.      . carvedilol (COREG) 25 MG tablet take 1 and 1/2 tablet by mouth twice a day  90 tablet  1  . furosemide (LASIX) 40 MG tablet Take 1 tablet (40 mg total) by mouth daily.  30 tablet    . furosemide (LASIX) 40 MG tablet take 1 tablet by mouth twice a day  180 tablet  1  . hydrALAZINE (APRESOLINE) 50 MG tablet Take 50 mg by mouth 3 (three) times daily.      . hydrALAZINE (APRESOLINE) 50 MG tablet take 1 tablet by mouth three times a day  270 tablet  2  . nitroGLYCERIN (NITROSTAT) 0.4 MG SL tablet Place 0.4 mg under the tongue every 5 (five) minutes as needed.      Marland Kitchen PARoxetine (PAXIL) 20 MG tablet Take 20 mg by mouth daily.       . potassium chloride SA (K-DUR,KLOR-CON) 20 MEQ tablet Take 10 mEq by mouth daily.       . potassium chloride SA (KLOR-CON M20) 20 MEQ tablet Take 0.5 tablets (10 mEq total) by mouth daily.  45 tablet  0  . zolpidem (AMBIEN) 10 MG tablet Take 10 mg by mouth at bedtime as needed. For sleep        No current facility-administered medications for this visit.    ROS:   General:  No weight loss, Fever, chills  HEENT: No recent headaches, no nasal bleeding, no visual changes, no sore throat  Neurologic: No dizziness, blackouts, seizures. No recent symptoms of stroke or mini- stroke. No recent episodes of slurred speech, or temporary blindness.  Cardiac: No recent episodes of chest pain/pressure, no shortness of breath at rest.  No shortness of breath with exertion.  Denies history of atrial fibrillation or irregular heartbeat  Vascular: No history of rest pain in feet.  No history of claudication.  No history of non-healing ulcer, No history of DVT   Pulmonary: No home oxygen, no productive cough, no hemoptysis,  No asthma or wheezing  Musculoskeletal:  [ ]  Arthritis, [ ]  Low back pain,  [ ]  Joint pain  Hematologic:No history of hypercoagulable state.  No history of easy bleeding.  No history of anemia  Gastrointestinal:  No hematochezia or melena,  No gastroesophageal reflux, no trouble swallowing  Urinary: [ ]  chronic Kidney disease, [ ]  on HD - [ ]  MWF or [ ]  TTHS, [ ]  Burning with urination, [ ]  Frequent urination, [ ]  Difficulty urinating;   Skin: No rashes  Psychological: No history of anxiety,  No  history of depression   Physical Examination  Filed Vitals:   07/15/13 1138  BP: 148/80  Pulse: 70  Temp: 98.2 F (36.8 C)  TempSrc: Oral  Height: 5\' 4"  (1.626 m)  Weight: 171 lb (77.565 kg)  SpO2: 100%    Body mass index is 29.34 kg/(m^2).  General:  Alert and oriented, no acute distress HEENT: Normal Neck: No bruit or JVD Pulmonary: Clear to auscultation bilaterally Cardiac: Regular Rate and Rhythm without murmur Abdomen: Soft, non-tender, non-distended, no mass, well-healed midline scar Skin: No rash Extremity Pulses:  2+ radial, brachial, femoral pulses bilaterally Musculoskeletal: No deformity or edema  Neurologic: Upper and lower extremity motor 5/5 and symmetric  DATA:  CT scan of the chest abdomen and pelvis from earlier this week is reviewed. This shows a 5.7 cm saccular descending thoracic aneurysm. Abdominal aorta was 3.8 x 3.4 cm. Right renal artery stenosis. Left renal artery stenosis. 50% stenosis right distal external iliac artery just above the groin crease. Ectatic right common iliac artery to   ASSESSMENT:  5.7 cm descending thoracic aortic aneurysm. This is currently asymptomatic a large enough to consider repair. It appears to be amenable to possible percutaneous stent graft repair. The patient may have right external iliac artery stenosis of access will most likely be via the left femoral system.   PLAN:  We will make measurements of the films and determine whether or not she is a stent graft candidate. We will schedule her for cardiac risk stratification by Dr. Johney Frame. If her anatomy is not suitable for stent graft repair we may continue to observe every 6 months into  the aneurysm is greater than 6-6.5 cm in diameter.  [name], MD [facility] [place] Office: [phone] Pager: [phone]

## 2013-07-16 ENCOUNTER — Ambulatory Visit (INDEPENDENT_AMBULATORY_CARE_PROVIDER_SITE_OTHER): Payer: PRIVATE HEALTH INSURANCE | Admitting: Internal Medicine

## 2013-07-16 ENCOUNTER — Encounter: Payer: Self-pay | Admitting: Internal Medicine

## 2013-07-16 ENCOUNTER — Other Ambulatory Visit: Payer: Self-pay | Admitting: Internal Medicine

## 2013-07-16 VITALS — BP 162/83 | HR 68 | Ht 64.0 in | Wt 184.0 lb

## 2013-07-16 DIAGNOSIS — Z01810 Encounter for preprocedural cardiovascular examination: Secondary | ICD-10-CM

## 2013-07-16 DIAGNOSIS — I5022 Chronic systolic (congestive) heart failure: Secondary | ICD-10-CM

## 2013-07-16 DIAGNOSIS — I472 Ventricular tachycardia: Secondary | ICD-10-CM

## 2013-07-16 DIAGNOSIS — I429 Cardiomyopathy, unspecified: Secondary | ICD-10-CM

## 2013-07-16 DIAGNOSIS — I712 Thoracic aortic aneurysm, without rupture, unspecified: Secondary | ICD-10-CM

## 2013-07-16 DIAGNOSIS — I4729 Other ventricular tachycardia: Secondary | ICD-10-CM

## 2013-07-16 DIAGNOSIS — I10 Essential (primary) hypertension: Secondary | ICD-10-CM

## 2013-07-16 DIAGNOSIS — R0602 Shortness of breath: Secondary | ICD-10-CM

## 2013-07-16 DIAGNOSIS — I4581 Long QT syndrome: Secondary | ICD-10-CM

## 2013-07-16 DIAGNOSIS — I428 Other cardiomyopathies: Secondary | ICD-10-CM

## 2013-07-16 LAB — MDC_IDC_ENUM_SESS_TYPE_INCLINIC
Battery Remaining Longevity: 78 mo
HighPow Impedance: 37 Ohm
HighPow Impedance: 37.361
Implantable Pulse Generator Serial Number: 754557
Lead Channel Impedance Value: 312.5 Ohm
Lead Channel Pacing Threshold Amplitude: 1 V
Lead Channel Pacing Threshold Pulse Width: 0.4 ms
Lead Channel Pacing Threshold Pulse Width: 0.4 ms
Lead Channel Setting Pacing Pulse Width: 0.4 ms
Lead Channel Setting Sensing Sensitivity: 0.5 mV
MDC IDC MSMT LEADCHNL RV PACING THRESHOLD AMPLITUDE: 1 V
MDC IDC MSMT LEADCHNL RV SENSING INTR AMPL: 12 mV
MDC IDC SESS DTM: 20150605164211
MDC IDC SET LEADCHNL RV PACING AMPLITUDE: 2.5 V
MDC IDC SET ZONE DETECTION INTERVAL: 350 ms
MDC IDC STAT BRADY RV PERCENT PACED: 0 %
Zone Setting Detection Interval: 280 ms

## 2013-07-16 NOTE — Patient Instructions (Signed)
Your physician wants you to follow-up in: 6 months with Norma Fredrickson and in 12 months with Dr Jacquiline Doe will receive a reminder letter in the mail two months in advance. If you don't receive a letter, please call our office to schedule the follow-up appointment.   Remote monitoring is used to monitor your Pacemaker or ICD from home. This monitoring reduces the number of office visits required to check your device to one time per year. It allows [place] to keep an eye on the functioning of your device to ensure it is working properly. You are scheduled for a device check from home on 10/19/13. You may send your transmission at any time that day. If you have a wireless device, the transmission will be sent automatically. After your physician reviews your transmission, you will receive a postcard with your next transmission date.  Your physician has requested that you have an echocardiogram. Echocardiography is a painless test that uses sound waves to create images of your heart. It provides your doctor with information about the size and shape of your heart and how well your heart's chambers and valves are working. This procedure takes approximately one hour. There are no restrictions for this procedure.  Your physician has requested that you have a lexiscan myoview. For further information please visit [email or web address] Please follow instruction sheet, as given.

## 2013-07-18 ENCOUNTER — Encounter: Payer: Self-pay | Admitting: Internal Medicine

## 2013-07-18 DIAGNOSIS — Z01810 Encounter for preprocedural cardiovascular examination: Secondary | ICD-10-CM | POA: Insufficient documentation

## 2013-07-18 NOTE — Progress Notes (Signed)
PCP:  Dorrene GermanAVBUERE,EDWIN A, MD  The patient presents today for cardiology followup.  She is planned for AAA management by Dr Darrick PennaFields and presents today for preoperative evaluation.  She reports SOB with moderate activity.  She thinks that this is worse than when I have seen her in the past.   She has had no further symptoms of arrhythmias.   Today, she denies symptoms of palpitations, chest pain,  lower extremity edema,  or neurologic sequela.  The patient feels that she is tolerating medications without difficulties and is otherwise without complaint today.      Past Medical History  Diagnosis Date  . Long Q-T syndrome     a.  w fx of sudden cardiac arrest;  b. s/p Guidant AICD  . HTN (hypertension)   . Diabetes mellitus   . Glaucoma   . Noncompliance   . CAD (coronary artery disease)     a.  LHC 01/04/11: dLM 20%, pLAD 90%, oD1 80%, RCA 80%, EF 35-40%.;  b. 02/2011 CABG x 3: LIMA->LAD, VG->DIAG, VG->Dist RCA  . Ischemic cardiomyopathy     a. 12/2010 Echo: EF 20-25%, sept/apical AK, Sev Dil LV  . Chronic systolic heart failure   . AAA (abdominal aortic aneurysm)     a. Abd u/s 01/07/11: 5.3x5.5 cm infrarenal fusiform AAA.;  b.06/2011: CTA Abd: 6.2 cm infrarenal AAA  . HLD (hyperlipidemia)   . Asthma   . Renal artery stenosis     a.  LHC 11/12: prox left RA 80% stenosis  . Seizures     in 20's elevated bp  . GERD (gastroesophageal reflux disease)   . Fibromyalgia   . Anemia   . Peripheral vascular disease   . Pleural effusion, left     a. s/p thoracentesis x 2 04/2011 - total of 2.5L taken off  . Sleep apnea     sleep study 2 yrs ago sehv  . Polymorphic ventricular tachycardia     s/p appropriate ICD therapy   Past Surgical History  Procedure Laterality Date  . Cardiac defibrillator placement      in 1997 with revision to a  Guidant model 1860 single chamber implantable cardioverter- defib on Feb18, 2003, most recent generator change 2/11 by Dr Johney FrameAllred SJM  . Laminectomy  May 2008  .  Right knee resconstructuion  1970  . Laminectomy  11/20/07     LF5-S1 fusion  . Back surgery    . Tonsillectomy    . Abdominal hysterectomy  88  . Hemorroidectomy  77  . Coronary artery bypass graft  03/07/2011    Procedure: CORONARY ARTERY BYPASS GRAFTING (CABG);  Surgeon: Purcell Nailslarence H Owen, MD;  [facility];  Service: Open Heart Surgery;  Laterality: N/A;  Coronary artery bypass graft times three using left internal mammary artery and left leg greater saphenous vein harvested endoscopically  . Abdominal aortic aneurysm repair  07/29/2011    Procedure: ANEURYSM ABDOMINAL AORTIC REPAIR;  Surgeon: Sherren Kernsharles E Fields, MD;  [facility];  Service: Vascular;  Laterality: N/A;  Resection and grafting Abdominal Aortic Aneurysm with straight 18mm x 30cm hemasheild graft    Current Outpatient Prescriptions  Medication Sig Dispense Refill  . aspirin 325 MG EC tablet Take 325 mg by mouth daily.      . carvedilol (COREG) 25 MG tablet take 1 and 1/2 tablets by mouth twice a day  90 tablet  0  . furosemide (LASIX) 40 MG tablet take 1 tablet by mouth twice a day  180 tablet  1  . hydrALAZINE (APRESOLINE) 50 MG tablet Take 50 mg by mouth 3 (three) times daily.      . nitroGLYCERIN (NITROSTAT) 0.4 MG SL tablet Place 0.4 mg under the tongue every 5 (five) minutes as needed.      Marland Kitchen PARoxetine (PAXIL) 20 MG tablet Take 20 mg by mouth daily.       . potassium chloride SA (KLOR-CON M20) 20 MEQ tablet Take 0.5 tablets (10 mEq total) by mouth daily.  45 tablet  0  . zolpidem (AMBIEN) 10 MG tablet Take 10 mg by mouth at bedtime as needed. For sleep        No current facility-administered medications for this visit.    Allergies  Allergen Reactions  . Tape Other (See Comments)    Pt states she struggles with removing the adhesive...   . Penicillins Rash    History   Social History  . Marital Status: Widowed    Spouse Name: N/A    Number of Children: N/A  . Years of Education: N/A   Occupational  History  . Not on file.   Social History Main Topics  . Smoking status: Former Smoker -- 0.30 packs/day    Types: Cigarettes    Quit date: 01/12/2011  . Smokeless tobacco: Never Used     Comment:    . Alcohol Use: No  . Drug Use: No  . Sexual Activity: No     Comment: hysterectomy   Other Topics Concern  . Not on file   Social History Narrative   Lives in [place] with her adopted son who is 39 and a god brother, who is 22. 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    ROS- all systems reviewed and negative except as per HPI  Family History  Problem Relation Age of Onset  . Long QT syndrome Mother   . Heart disease Mother     before age 54  . Hypertension Mother   . Long QT syndrome Sister   . Heart disease Sister   . Long QT syndrome Brother   . Heart disease Brother   . Heart attack Brother   . Long QT syndrome Daughter   . Heart disease Daughter   . Coronary artery disease Sister 48     Physical Exam: Filed Vitals:   07/16/13 1624  BP: 162/83  Pulse: 68  Height: 5\' 4"  (1.626 m)  Weight: 184 lb (83.462 kg)    GEN- The patient is well appearing, alert and oriented x 3 today.   Head- normocephalic, atraumatic Eyes-  Sclera clear, conjunctiva pink Ears- hearing intact Oropharynx- clear Neck- supple, no JVP Lungs- Clear to ausculation bilaterally, normal work of breathing Chest- ICD pocket is well healed Heart- Regular rate and rhythm, no murmurs, rubs or gallops, PMI not laterally displaced GI- soft, NT, ND, + BS Extremities- no clubbing, cyanosis, or edema MS- no significant deformity or atrophy Skin- no rash or lesion Psych- euthymic mood, full affect Neuro- strength and sensation are intact  ICD interrogation- reviewed in detail today,  See PACEART report ekg today reveals sinus rhythm with prolonged QT,  Diffuse T wave inversions  Assessment and Plan:  1. Chronic systolic dysfunction/ CAD euvolemic  today.   Obtain an echo to evaluate for interval change  2. Polymorphic VT/ prolonged QT Importance of compliance with medicine was encouraged today Well controlled with coreg to 37.5mg  BID though QT remains  very long Will need to avoid QT prolonging drugs perioperatively Normal ICD function See Pace Art report No changes today  3. Preoperative clearance Given SOB and limited activity, further assessment is advised I will order an echo and lexiscan myoview.  If these are low risk then she may proceed with surgery without further CV assessment.

## 2013-07-19 ENCOUNTER — Encounter: Payer: Self-pay | Admitting: Vascular Surgery

## 2013-07-21 ENCOUNTER — Other Ambulatory Visit: Payer: Self-pay | Admitting: *Deleted

## 2013-07-21 ENCOUNTER — Encounter: Payer: Self-pay | Admitting: Internal Medicine

## 2013-07-22 ENCOUNTER — Encounter (HOSPITAL_COMMUNITY): Payer: Self-pay | Admitting: Pharmacy Technician

## 2013-07-27 ENCOUNTER — Encounter (HOSPITAL_COMMUNITY)
Admission: RE | Admit: 2013-07-27 | Discharge: 2013-07-27 | Disposition: A | Discharge status: Home / Self Care | Payer: PRIVATE HEALTH INSURANCE | Source: Ambulatory Visit | Attending: Vascular Surgery | Admitting: Vascular Surgery

## 2013-07-27 ENCOUNTER — Encounter (HOSPITAL_COMMUNITY): Payer: Self-pay

## 2013-07-27 ENCOUNTER — Ambulatory Visit (HOSPITAL_COMMUNITY)
Admission: RE | Admit: 2013-07-27 | Discharge: 2013-07-27 | Disposition: A | Discharge status: Home / Self Care | Payer: PRIVATE HEALTH INSURANCE | Source: Ambulatory Visit | Attending: Anesthesiology | Admitting: Anesthesiology

## 2013-07-27 ENCOUNTER — Telehealth (HOSPITAL_COMMUNITY): Payer: Self-pay

## 2013-07-27 DIAGNOSIS — I714 Abdominal aortic aneurysm, without rupture, unspecified: Secondary | ICD-10-CM | POA: Insufficient documentation

## 2013-07-27 DIAGNOSIS — Z01818 Encounter for other preprocedural examination: Secondary | ICD-10-CM | POA: Diagnosis not present

## 2013-07-27 DIAGNOSIS — Z01812 Encounter for preprocedural laboratory examination: Secondary | ICD-10-CM | POA: Diagnosis not present

## 2013-07-27 HISTORY — DX: Cardiac arrhythmia, unspecified: I49.9

## 2013-07-27 LAB — COMPREHENSIVE METABOLIC PANEL
ALT: 9 U/L (ref 0–35)
AST: 15 U/L (ref 0–37)
Albumin: 3.7 g/dL (ref 3.5–5.2)
Alkaline Phosphatase: 100 U/L (ref 39–117)
BUN: 28 mg/dL — ABNORMAL HIGH (ref 6–23)
CALCIUM: 10.3 mg/dL (ref 8.4–10.5)
CO2: 26 meq/L (ref 19–32)
Chloride: 100 mEq/L (ref 96–112)
Creatinine, Ser: 1.68 mg/dL — ABNORMAL HIGH (ref 0.50–1.10)
GFR calc Af Amer: 36 mL/min — ABNORMAL LOW (ref >90)
GFR, EST NON AFRICAN AMERICAN: 31 mL/min — AB (ref >90)
Glucose, Bld: 99 mg/dL (ref 70–99)
Potassium: 3.9 mEq/L (ref 3.7–5.3)
SODIUM: 141 meq/L (ref 137–147)
Total Bilirubin: 0.3 mg/dL (ref 0.3–1.2)
Total Protein: 7.7 g/dL (ref 6.0–8.3)

## 2013-07-27 LAB — URINALYSIS, ROUTINE W REFLEX MICROSCOPIC
Bilirubin Urine: NEGATIVE
Glucose, UA: NEGATIVE mg/dL
Hgb urine dipstick: NEGATIVE
KETONES UR: NEGATIVE mg/dL
LEUKOCYTES UA: NEGATIVE
NITRITE: NEGATIVE
PH: 6 (ref 5.0–8.0)
PROTEIN: NEGATIVE mg/dL
Specific Gravity, Urine: 1.02 (ref 1.005–1.030)
UROBILINOGEN UA: 1 mg/dL (ref 0.0–1.0)

## 2013-07-27 LAB — PROTIME-INR
INR: 1.03 (ref 0.00–1.49)
Prothrombin Time: 13.3 seconds (ref 11.6–15.2)

## 2013-07-27 LAB — APTT: APTT: 29 s (ref 24–37)

## 2013-07-27 LAB — BLOOD GAS, ARTERIAL
Acid-Base Excess: 1.9 mmol/L (ref 0.0–2.0)
Bicarbonate: 24.5 mEq/L — ABNORMAL HIGH (ref 20.0–24.0)
Drawn by: 24231
FIO2: 0.21 %
O2 Saturation: 98.6 %
PH ART: 7.533 — AB (ref 7.350–7.450)
PO2 ART: 110 mmHg — AB (ref 80.0–100.0)
Patient temperature: 98.6
TCO2: 25.4 mmol/L (ref 0–100)
pCO2 arterial: 29.2 mmHg — ABNORMAL LOW (ref 35.0–45.0)

## 2013-07-27 LAB — CBC
HCT: 39.4 % (ref 36.0–46.0)
Hemoglobin: 12.6 g/dL (ref 12.0–15.0)
MCH: 29.4 pg (ref 26.0–34.0)
MCHC: 32 g/dL (ref 30.0–36.0)
MCV: 91.8 fL (ref 78.0–100.0)
Platelets: 242 10*3/uL (ref 150–400)
RBC: 4.29 MIL/uL (ref 3.87–5.11)
RDW: 14.4 % (ref 11.5–15.5)
WBC: 5.9 10*3/uL (ref 4.0–10.5)

## 2013-07-27 LAB — SURGICAL PCR SCREEN
MRSA, PCR: NEGATIVE
Staphylococcus aureus: NEGATIVE

## 2013-07-27 NOTE — Pre-Procedure Instructions (Addendum)
Lori York  07/27/2013   Your procedure is scheduled on:  Wednesday, June 24.  Report to [facility] Admitting at 6:30 AM.  Call this number if you have problems the morning of surgery: [phone]   Remember:   Do not eat food or drink liquids after midnight Tuesday, June 23.   Take these medicines the morning of surgery with A SIP OF WATER: carvedilol (COREG), hydrALAZINE (APRESOLINE),   PARoxetine (PAXIL).             Take if needed : nitroGLYCERIN (NITROSTAT).   Do not wear jewelry, make-up or nail polish.  Do not wear lotions, powders, or perfumes.  Do not shave 48 hours prior to surgery.  Do not bring valuables to the hospital.             [facility] is not responsible for any belongings or valuables.               Contacts, dentures or bridgework may not be worn into surgery.  Leave suitcase in the car. After surgery it may be brought to your room.  For patients admitted to the hospital, discharge time is determined by your treatment team.               Patients discharged the day of surgery will not be allowed to drive home.    Special Instructions: [place] - Preparing for Surgery  Before surgery, you can play an important role.  Because skin is not sterile, your skin needs to be as free of germs as possible.  You can reduce the number of germs on you skin by washing with CHG (chlorahexidine gluconate) soap before surgery.  CHG is an antiseptic cleaner which kills germs and bonds with the skin to continue killing germs even after washing.  Please DO NOT use if you have an allergy to CHG or antibacterial soaps.  If your skin becomes reddened/irritated stop using the CHG and inform your nurse when you arrive at Short Stay.  Do not shave (including legs and underarms) for at least 48 hours prior to the first CHG shower.  You may shave your face.  Please follow these instructions carefully:   1.  Shower with CHG Soap the night before surgery and the                                 morning of Surgery.  2.  If you choose to wash your hair, wash your hair first as usual with your       normal shampoo.  3.  After you shampoo, rinse your hair and body thoroughly to remove the                      Shampoo.  4.  Use CHG as you would any other liquid soap.  You can apply chg directly       to the skin and wash gently with scrungie or a clean washcloth.  5.  Apply the CHG Soap to your body ONLY FROM THE NECK DOWN.        Do not use on open wounds or open sores.  Avoid contact with your eyes,       ears, mouth and genitals (private parts).  Wash genitals (private parts)       with your normal soap.  6.  Wash thoroughly, paying special attention to  the area where your surgery        will be performed.  7.  Thoroughly rinse your body with warm water from the neck down.  8.  DO NOT shower/wash with your normal soap after using and rinsing off       the CHG Soap.  9.  Pat yourself dry with a clean towel.            10.  Wear clean pajamas.            11.  Place clean sheets on your bed the night of your first shower and do not        sleep with pets.  Day of Surgery  Do not apply any lotions/deoderants the morning of surgery.  Please wear clean clothes to the hospital/surgery center.      Please read over the following fact sheets that you were given: Pain Booklet, Coughing and Deep Breathing, Blood Transfusion Information and Surgical Site Infection Prevention

## 2013-07-27 NOTE — Progress Notes (Signed)
Lamar Sprinkles at [place]. Jude made aware of patient's surgery 0830 AM case and orders for ICD from Dr Johney Frame.Verbalized understanding.

## 2013-07-28 ENCOUNTER — Ambulatory Visit (HOSPITAL_COMMUNITY)
Admission: RE | Admit: 2013-07-28 | Discharge: 2013-07-28 | Disposition: A | Discharge status: Home / Self Care | Payer: PRIVATE HEALTH INSURANCE | Source: Ambulatory Visit | Attending: Cardiology | Admitting: Cardiology

## 2013-07-28 DIAGNOSIS — R0602 Shortness of breath: Secondary | ICD-10-CM

## 2013-07-28 DIAGNOSIS — R0989 Other specified symptoms and signs involving the circulatory and respiratory systems: Principal | ICD-10-CM | POA: Insufficient documentation

## 2013-07-28 DIAGNOSIS — R0609 Other forms of dyspnea: Secondary | ICD-10-CM | POA: Diagnosis present

## 2013-07-28 DIAGNOSIS — I379 Nonrheumatic pulmonary valve disorder, unspecified: Secondary | ICD-10-CM

## 2013-07-28 NOTE — Progress Notes (Signed)
2D Echo Performed 07/28/2013    Tammie Crouch, RCS  

## 2013-07-29 ENCOUNTER — Ambulatory Visit (HOSPITAL_COMMUNITY)
Admission: RE | Admit: 2013-07-29 | Discharge: 2013-07-29 | Disposition: A | Discharge status: Home / Self Care | Payer: PRIVATE HEALTH INSURANCE | Source: Ambulatory Visit | Attending: Cardiovascular Disease | Admitting: Cardiovascular Disease

## 2013-07-29 DIAGNOSIS — I2510 Atherosclerotic heart disease of native coronary artery without angina pectoris: Secondary | ICD-10-CM | POA: Insufficient documentation

## 2013-07-29 DIAGNOSIS — I10 Essential (primary) hypertension: Secondary | ICD-10-CM | POA: Insufficient documentation

## 2013-07-29 DIAGNOSIS — E119 Type 2 diabetes mellitus without complications: Secondary | ICD-10-CM | POA: Insufficient documentation

## 2013-07-29 DIAGNOSIS — R42 Dizziness and giddiness: Secondary | ICD-10-CM | POA: Insufficient documentation

## 2013-07-29 DIAGNOSIS — I2589 Other forms of chronic ischemic heart disease: Secondary | ICD-10-CM | POA: Insufficient documentation

## 2013-07-29 DIAGNOSIS — R5383 Other fatigue: Secondary | ICD-10-CM

## 2013-07-29 DIAGNOSIS — I739 Peripheral vascular disease, unspecified: Secondary | ICD-10-CM | POA: Insufficient documentation

## 2013-07-29 DIAGNOSIS — E669 Obesity, unspecified: Secondary | ICD-10-CM | POA: Insufficient documentation

## 2013-07-29 DIAGNOSIS — R5381 Other malaise: Secondary | ICD-10-CM | POA: Insufficient documentation

## 2013-07-29 DIAGNOSIS — Z8249 Family history of ischemic heart disease and other diseases of the circulatory system: Secondary | ICD-10-CM | POA: Insufficient documentation

## 2013-07-29 DIAGNOSIS — R9431 Abnormal electrocardiogram [ECG] [EKG]: Secondary | ICD-10-CM | POA: Insufficient documentation

## 2013-07-29 DIAGNOSIS — R0989 Other specified symptoms and signs involving the circulatory and respiratory systems: Secondary | ICD-10-CM | POA: Insufficient documentation

## 2013-07-29 DIAGNOSIS — R0602 Shortness of breath: Secondary | ICD-10-CM

## 2013-07-29 DIAGNOSIS — J45909 Unspecified asthma, uncomplicated: Secondary | ICD-10-CM | POA: Insufficient documentation

## 2013-07-29 DIAGNOSIS — Z951 Presence of aortocoronary bypass graft: Secondary | ICD-10-CM | POA: Insufficient documentation

## 2013-07-29 DIAGNOSIS — R0609 Other forms of dyspnea: Secondary | ICD-10-CM | POA: Insufficient documentation

## 2013-07-29 DIAGNOSIS — Z87891 Personal history of nicotine dependence: Secondary | ICD-10-CM | POA: Insufficient documentation

## 2013-07-29 MED ORDER — TECHNETIUM TC 99M SESTAMIBI GENERIC - CARDIOLITE
10.0000 | Freq: Once | INTRAVENOUS | Status: AC | PRN
  Administered 2013-07-29: 10 via INTRAVENOUS

## 2013-07-29 MED ORDER — TECHNETIUM TC 99M SESTAMIBI GENERIC - CARDIOLITE
30.0000 | Freq: Once | INTRAVENOUS | Status: AC | PRN
  Administered 2013-07-29: 30 via INTRAVENOUS

## 2013-07-29 MED ORDER — REGADENOSON 0.4 MG/5ML IV SOLN
0.4000 mg | Freq: Once | INTRAVENOUS | Status: AC
  Administered 2013-07-29: 0.4 mg via INTRAVENOUS

## 2013-07-29 NOTE — Procedures (Addendum)
[facility] [address] [place] [address] [place] [address] [phone]  Cardiology Nuclear Med Study  Lori York is a 68 y.o. female     MRN : [id number]     DOB: 02/12/1946  Procedure Date: 07/29/2013  Nuclear Med Background Indication for Stress Test:  Surgical Clearance and Abnormal EKG History:  Asthma and CAD;CABG X3-03/03/2011;AICD;Ischemic cardiomyopathy;seizures;No prior NUC MPI for comparison. Cardiac Risk Factors: Family History - CAD, History of Smoking, Hypertension, Lipids, NIDDM, Obesity, PVD and PE;AAA-renal;Bifemoral Aortic Bypass  Symptoms:  Dizziness, DOE and Fatigue   Nuclear Pre-Procedure Caffeine/Decaff Intake:  8:00pm NPO After: 6:00am   IV Site: R Hand  IV 0.9% NS with Angio Cath:  22g  Chest Size (in):  n/a IV Started by: Berdie Ogren, RN  Height: 5\' 4"  (1.626 m)  Cup Size: B  BMI:  Body mass index is 31.57 kg/(m^2). Weight:  184 lb (83.462 kg)   Tech Comments:  n/a    Nuclear Med Study 1 or 2 day study: 1 day  Stress Test Type:  Lexiscan  Order Authorizing Provider:  Hillis Range, MD   Resting Radionuclide: Technetium 82m Sestamibi  Resting Radionuclide Dose: 10.3 mCi   Stress Radionuclide:  Technetium 41m Sestamibi  Stress Radionuclide Dose: 29.1 mCi           Stress Protocol Rest HR: 51 Stress HR: 69  Rest BP: 144/88 Stress BP: 154/84  Exercise Time (min): n/a METS: n/a   Predicted Max HR: 155 bpm % Max HR: 49.03 bpm Rate Pressure Product: 56979  Dose of Adenosine (mg):  n/a Dose of Lexiscan: 0.4 mg  Dose of Atropine (mg): n/a Dose of Dobutamine: n/a mcg/kg/min (at max HR)  Stress Test Technologist: Esperanza Sheets, CCT Nuclear Technologist: Koren Shiver, CNMT   Rest Procedure:  Myocardial perfusion imaging was performed at rest 45 minutes following the intravenous administration of Technetium 66m Sestamibi. Stress Procedure:  The patient received IV Lexiscan 0.4 mg over 15-seconds.   Technetium 56m Sestamibi injected IV at 30-seconds.  There were no significant changes with Lexiscan.  Quantitative spect images were obtained after a 45 minute delay.  Transient Ischemic Dilatation (Normal <1.22):  1.13 QGS EDV:  119 ml QGS ESV:  51 ml LV Ejection Fraction: 57%      Rest ECG: NSR with diffuse deeply inverted T wave abnormalities; Q wave in aVL  Stress ECG: No significant change from baseline ECG  QPS Raw Data Images:  Normal; no motion artifact; normal heart/lung ratio. Stress Images:  Normal homogeneous uptake in all areas of the myocardium. Rest Images:  Normal homogeneous uptake in all areas of the myocardium. Subtraction (SDS):  Normal  Impression Exercise Capacity:  Lexiscan with no exercise. BP Response:  Normal blood pressure response. Clinical Symptoms:  Mild shortness of breath ECG Impression:  No significant change from baseline diffuse T wave changes inferolaterally Comparison with Prior Nuclear Study:  No study for comparison  Overall Impression:  Normal stress nuclear study.  LV Wall Motion:  NL LV Function, EF 57%; NL Wall Motion   Goldia Ligman A, MD  07/29/2013 6:25 PM

## 2013-08-03 MED ORDER — VANCOMYCIN HCL IN DEXTROSE 1-5 GM/200ML-% IV SOLN: 1000.0000 mg | INTRAVENOUS | Status: DC

## 2013-08-04 ENCOUNTER — Inpatient Hospital Stay (HOSPITAL_COMMUNITY)
Admission: RE | Admit: 2013-08-04 | Discharge: 2013-08-10 | DRG: 219 | Disposition: A | Discharge status: Home / Self Care | Payer: PRIVATE HEALTH INSURANCE | Source: Ambulatory Visit | Attending: Vascular Surgery | Admitting: Vascular Surgery

## 2013-08-04 ENCOUNTER — Inpatient Hospital Stay (HOSPITAL_COMMUNITY): Payer: PRIVATE HEALTH INSURANCE | Admitting: Certified Registered Nurse Anesthetist

## 2013-08-04 ENCOUNTER — Encounter (HOSPITAL_COMMUNITY): Payer: PRIVATE HEALTH INSURANCE | Admitting: Certified Registered Nurse Anesthetist

## 2013-08-04 ENCOUNTER — Encounter (HOSPITAL_COMMUNITY)
Admission: RE | Disposition: A | Discharge status: Home / Self Care | Payer: Self-pay | Source: Ambulatory Visit | Attending: Vascular Surgery

## 2013-08-04 ENCOUNTER — Inpatient Hospital Stay (HOSPITAL_COMMUNITY): Payer: PRIVATE HEALTH INSURANCE

## 2013-08-04 ENCOUNTER — Other Ambulatory Visit: Payer: Self-pay | Admitting: *Deleted

## 2013-08-04 ENCOUNTER — Encounter (HOSPITAL_COMMUNITY): Payer: Self-pay | Admitting: *Deleted

## 2013-08-04 DIAGNOSIS — D62 Acute posthemorrhagic anemia: Secondary | ICD-10-CM | POA: Diagnosis not present

## 2013-08-04 DIAGNOSIS — I701 Atherosclerosis of renal artery: Secondary | ICD-10-CM | POA: Diagnosis present

## 2013-08-04 DIAGNOSIS — I10 Essential (primary) hypertension: Secondary | ICD-10-CM | POA: Diagnosis present

## 2013-08-04 DIAGNOSIS — I714 Abdominal aortic aneurysm, without rupture, unspecified: Secondary | ICD-10-CM

## 2013-08-04 DIAGNOSIS — E785 Hyperlipidemia, unspecified: Secondary | ICD-10-CM | POA: Diagnosis present

## 2013-08-04 DIAGNOSIS — I712 Thoracic aortic aneurysm, without rupture, unspecified: Secondary | ICD-10-CM | POA: Diagnosis present

## 2013-08-04 DIAGNOSIS — J189 Pneumonia, unspecified organism: Secondary | ICD-10-CM | POA: Diagnosis present

## 2013-08-04 DIAGNOSIS — J45909 Unspecified asthma, uncomplicated: Secondary | ICD-10-CM | POA: Diagnosis present

## 2013-08-04 DIAGNOSIS — Z48812 Encounter for surgical aftercare following surgery on the circulatory system: Secondary | ICD-10-CM

## 2013-08-04 DIAGNOSIS — Z7982 Long term (current) use of aspirin: Secondary | ICD-10-CM | POA: Diagnosis not present

## 2013-08-04 DIAGNOSIS — I739 Peripheral vascular disease, unspecified: Secondary | ICD-10-CM | POA: Diagnosis present

## 2013-08-04 DIAGNOSIS — H409 Unspecified glaucoma: Secondary | ICD-10-CM | POA: Diagnosis present

## 2013-08-04 DIAGNOSIS — Z79899 Other long term (current) drug therapy: Secondary | ICD-10-CM

## 2013-08-04 DIAGNOSIS — J9819 Other pulmonary collapse: Secondary | ICD-10-CM | POA: Diagnosis present

## 2013-08-04 DIAGNOSIS — Z95810 Presence of automatic (implantable) cardiac defibrillator: Secondary | ICD-10-CM

## 2013-08-04 DIAGNOSIS — IMO0001 Reserved for inherently not codable concepts without codable children: Secondary | ICD-10-CM | POA: Diagnosis present

## 2013-08-04 DIAGNOSIS — I2510 Atherosclerotic heart disease of native coronary artery without angina pectoris: Secondary | ICD-10-CM | POA: Diagnosis present

## 2013-08-04 DIAGNOSIS — I5022 Chronic systolic (congestive) heart failure: Secondary | ICD-10-CM | POA: Diagnosis present

## 2013-08-04 DIAGNOSIS — K219 Gastro-esophageal reflux disease without esophagitis: Secondary | ICD-10-CM | POA: Diagnosis present

## 2013-08-04 DIAGNOSIS — R209 Unspecified disturbances of skin sensation: Secondary | ICD-10-CM | POA: Diagnosis present

## 2013-08-04 DIAGNOSIS — E119 Type 2 diabetes mellitus without complications: Secondary | ICD-10-CM | POA: Diagnosis present

## 2013-08-04 DIAGNOSIS — K429 Umbilical hernia without obstruction or gangrene: Secondary | ICD-10-CM | POA: Diagnosis present

## 2013-08-04 DIAGNOSIS — Z951 Presence of aortocoronary bypass graft: Secondary | ICD-10-CM | POA: Diagnosis not present

## 2013-08-04 DIAGNOSIS — Z87891 Personal history of nicotine dependence: Secondary | ICD-10-CM

## 2013-08-04 DIAGNOSIS — I743 Embolism and thrombosis of arteries of the lower extremities: Secondary | ICD-10-CM

## 2013-08-04 DIAGNOSIS — I2589 Other forms of chronic ischemic heart disease: Secondary | ICD-10-CM | POA: Diagnosis present

## 2013-08-04 HISTORY — DX: Anxiety disorder, unspecified: F41.9

## 2013-08-04 HISTORY — DX: Type 2 diabetes mellitus without complications: E11.9

## 2013-08-04 HISTORY — DX: Polyneuropathy, unspecified: G62.9

## 2013-08-04 HISTORY — PX: ENDARTERECTOMY FEMORAL: SHX5804

## 2013-08-04 HISTORY — PX: PATCH ANGIOPLASTY: SHX6230

## 2013-08-04 HISTORY — DX: Presence of automatic (implantable) cardiac defibrillator: Z95.810

## 2013-08-04 HISTORY — PX: THORACIC AORTIC ENDOVASCULAR STENT GRAFT: SHX6112

## 2013-08-04 LAB — CBC
HCT: 29.5 % — ABNORMAL LOW (ref 36.0–46.0)
Hemoglobin: 9.4 g/dL — ABNORMAL LOW (ref 12.0–15.0)
MCH: 29.2 pg (ref 26.0–34.0)
MCHC: 31.9 g/dL (ref 30.0–36.0)
MCV: 91.6 fL (ref 78.0–100.0)
Platelets: 176 10*3/uL (ref 150–400)
RBC: 3.22 MIL/uL — AB (ref 3.87–5.11)
RDW: 14.4 % (ref 11.5–15.5)
WBC: 5.7 10*3/uL (ref 4.0–10.5)

## 2013-08-04 LAB — BASIC METABOLIC PANEL
BUN: 22 mg/dL (ref 6–23)
CO2: 24 meq/L (ref 19–32)
Calcium: 8.9 mg/dL (ref 8.4–10.5)
Chloride: 105 mEq/L (ref 96–112)
Creatinine, Ser: 1.5 mg/dL — ABNORMAL HIGH (ref 0.50–1.10)
GFR calc Af Amer: 41 mL/min — ABNORMAL LOW (ref >90)
GFR calc non Af Amer: 35 mL/min — ABNORMAL LOW (ref >90)
GLUCOSE: 162 mg/dL — AB (ref 70–99)
POTASSIUM: 4 meq/L (ref 3.7–5.3)
SODIUM: 140 meq/L (ref 137–147)

## 2013-08-04 LAB — POCT I-STAT 4, (NA,K, GLUC, HGB,HCT)
GLUCOSE: 107 mg/dL — AB (ref 70–99)
HCT: 27 % — ABNORMAL LOW (ref 36.0–46.0)
Hemoglobin: 9.2 g/dL — ABNORMAL LOW (ref 12.0–15.0)
POTASSIUM: 3.2 meq/L — AB (ref 3.7–5.3)
Sodium: 142 mEq/L (ref 137–147)

## 2013-08-04 LAB — PROTIME-INR
INR: 1.24 (ref 0.00–1.49)
PROTHROMBIN TIME: 15.6 s — AB (ref 11.6–15.2)

## 2013-08-04 LAB — GLUCOSE, CAPILLARY
GLUCOSE-CAPILLARY: 110 mg/dL — AB (ref 70–99)
GLUCOSE-CAPILLARY: 147 mg/dL — AB (ref 70–99)

## 2013-08-04 LAB — APTT: aPTT: 32 seconds (ref 24–37)

## 2013-08-04 LAB — MAGNESIUM: MAGNESIUM: 1.8 mg/dL (ref 1.5–2.5)

## 2013-08-04 LAB — POCT ACTIVATED CLOTTING TIME: ACTIVATED CLOTTING TIME: 219 s

## 2013-08-04 SURGERY — INSERTION, ENDOVASCULAR STENT GRAFT, AORTA, THORACIC
Anesthesia: General

## 2013-08-04 MED ORDER — ONDANSETRON HCL 4 MG/2ML IJ SOLN
INTRAMUSCULAR | Status: AC
  Filled 2013-08-04: qty 2

## 2013-08-04 MED ORDER — ETOMIDATE 2 MG/ML IV SOLN
INTRAVENOUS | Status: AC
  Filled 2013-08-04: qty 10

## 2013-08-04 MED ORDER — DOCUSATE SODIUM 100 MG PO CAPS
100.0000 mg | ORAL_CAPSULE | Freq: Every day | ORAL | Status: DC
  Administered 2013-08-06 – 2013-08-10 (×5): 100 mg via ORAL
  Filled 2013-08-04 (×6): qty 1

## 2013-08-04 MED ORDER — ONDANSETRON HCL 4 MG/2ML IJ SOLN
INTRAMUSCULAR | Status: DC | PRN
  Administered 2013-08-04 (×2): 4 mg via INTRAVENOUS

## 2013-08-04 MED ORDER — STERILE WATER FOR INJECTION IJ SOLN
INTRAMUSCULAR | Status: AC
  Filled 2013-08-04: qty 10

## 2013-08-04 MED ORDER — NITROGLYCERIN 0.4 MG SL SUBL: 0.4000 mg | SUBLINGUAL_TABLET | SUBLINGUAL | Status: DC | PRN

## 2013-08-04 MED ORDER — HYDRALAZINE HCL 50 MG PO TABS
50.0000 mg | ORAL_TABLET | Freq: Three times a day (TID) | ORAL | Status: DC
  Administered 2013-08-04 – 2013-08-10 (×16): 50 mg via ORAL
  Filled 2013-08-04 (×21): qty 1

## 2013-08-04 MED ORDER — HEPARIN SODIUM (PORCINE) 1000 UNIT/ML IJ SOLN
INTRAMUSCULAR | Status: DC | PRN
  Administered 2013-08-04 (×3): 5 mL via INTRAVENOUS
  Administered 2013-08-04: 3 mL via INTRAVENOUS

## 2013-08-04 MED ORDER — LABETALOL HCL 5 MG/ML IV SOLN
10.0000 mg | INTRAVENOUS | Status: DC | PRN
  Administered 2013-08-04 (×2): 5 mg via INTRAVENOUS
  Filled 2013-08-04: qty 4

## 2013-08-04 MED ORDER — OXYCODONE HCL 5 MG/5ML PO SOLN: 5.0000 mg | Freq: Once | ORAL | Status: DC | PRN

## 2013-08-04 MED ORDER — ZOLPIDEM TARTRATE 5 MG PO TABS
5.0000 mg | ORAL_TABLET | Freq: Every evening | ORAL | Status: DC | PRN
  Administered 2013-08-04 – 2013-08-08 (×5): 5 mg via ORAL
  Filled 2013-08-04 (×5): qty 1

## 2013-08-04 MED ORDER — FENTANYL CITRATE 0.05 MG/ML IJ SOLN
INTRAMUSCULAR | Status: DC | PRN
  Administered 2013-08-04 (×2): 50 ug via INTRAVENOUS
  Administered 2013-08-04: 100 ug via INTRAVENOUS
  Administered 2013-08-04: 50 ug via INTRAVENOUS
  Administered 2013-08-04 (×2): 100 ug via INTRAVENOUS
  Administered 2013-08-04: 50 ug via INTRAVENOUS

## 2013-08-04 MED ORDER — FENTANYL CITRATE 0.05 MG/ML IJ SOLN
INTRAMUSCULAR | Status: AC
  Filled 2013-08-04: qty 5

## 2013-08-04 MED ORDER — CHLORHEXIDINE GLUCONATE 4 % EX LIQD
60.0000 mL | Freq: Once | CUTANEOUS | Status: DC
  Filled 2013-08-04: qty 60

## 2013-08-04 MED ORDER — NITROGLYCERIN 0.2 MG/ML ON CALL CATH LAB
INTRAVENOUS | Status: DC | PRN
  Administered 2013-08-04: 40 ug via INTRAVENOUS
  Administered 2013-08-04: 20 ug via INTRAVENOUS
  Administered 2013-08-04 (×4): 40 ug via INTRAVENOUS

## 2013-08-04 MED ORDER — ESMOLOL HCL 10 MG/ML IV SOLN
INTRAVENOUS | Status: DC | PRN
  Administered 2013-08-04 (×3): 10 mg via INTRAVENOUS

## 2013-08-04 MED ORDER — SODIUM CHLORIDE 0.9 % IR SOLN
Status: DC | PRN
  Administered 2013-08-04 (×2)

## 2013-08-04 MED ORDER — SODIUM CHLORIDE 0.9 % IV SOLN: INTRAVENOUS | Status: DC

## 2013-08-04 MED ORDER — SODIUM CHLORIDE 0.9 % IV SOLN
10.0000 mg | INTRAVENOUS | Status: DC | PRN
  Administered 2013-08-04: 25 ug/min via INTRAVENOUS

## 2013-08-04 MED ORDER — LACTATED RINGERS IV SOLN
INTRAVENOUS | Status: DC | PRN
  Administered 2013-08-04 (×2): via INTRAVENOUS

## 2013-08-04 MED ORDER — MIDAZOLAM HCL 5 MG/5ML IJ SOLN
INTRAMUSCULAR | Status: DC | PRN
  Administered 2013-08-04 (×2): 1 mg via INTRAVENOUS

## 2013-08-04 MED ORDER — THROMBIN 20000 UNITS EX SOLR
CUTANEOUS | Status: AC
  Filled 2013-08-04: qty 20000

## 2013-08-04 MED ORDER — LABETALOL HCL 5 MG/ML IV SOLN
INTRAVENOUS | Status: AC
  Filled 2013-08-04: qty 4

## 2013-08-04 MED ORDER — ROCURONIUM BROMIDE 100 MG/10ML IV SOLN
INTRAVENOUS | Status: DC | PRN
  Administered 2013-08-04: 50 mg via INTRAVENOUS

## 2013-08-04 MED ORDER — ALUM & MAG HYDROXIDE-SIMETH 200-200-20 MG/5ML PO SUSP
15.0000 mL | ORAL | Status: DC | PRN
Start: 2013-08-04 — End: 2013-08-10
  Administered 2013-08-05 – 2013-08-09 (×2): 30 mL via ORAL
  Filled 2013-08-04 (×3): qty 30

## 2013-08-04 MED ORDER — LACTATED RINGERS IV SOLN
INTRAVENOUS | Status: DC | PRN
  Administered 2013-08-04: 08:00:00 via INTRAVENOUS

## 2013-08-04 MED ORDER — OXYCODONE HCL 5 MG PO TABS
5.0000 mg | ORAL_TABLET | ORAL | Status: DC | PRN
Start: 2013-08-04 — End: 2013-08-05
  Administered 2013-08-05: 10 mg via ORAL
  Filled 2013-08-04: qty 2

## 2013-08-04 MED ORDER — CARVEDILOL 25 MG PO TABS
37.5000 mg | ORAL_TABLET | Freq: Two times a day (BID) | ORAL | Status: DC
  Administered 2013-08-04 – 2013-08-10 (×11): 37.5 mg via ORAL
  Filled 2013-08-04 (×16): qty 1

## 2013-08-04 MED ORDER — NEOSTIGMINE METHYLSULFATE 10 MG/10ML IV SOLN
INTRAVENOUS | Status: DC | PRN
  Administered 2013-08-04: 3 mg via INTRAVENOUS

## 2013-08-04 MED ORDER — PROTAMINE SULFATE 10 MG/ML IV SOLN
INTRAVENOUS | Status: DC | PRN
  Administered 2013-08-04: 10 mg via INTRAVENOUS
  Administered 2013-08-04: 40 mg via INTRAVENOUS

## 2013-08-04 MED ORDER — DOPAMINE-DEXTROSE 3.2-5 MG/ML-% IV SOLN: 3.0000 ug/kg/min | INTRAVENOUS | Status: DC

## 2013-08-04 MED ORDER — PHENYLEPHRINE HCL 10 MG/ML IJ SOLN
INTRAMUSCULAR | Status: AC
  Filled 2013-08-04: qty 1

## 2013-08-04 MED ORDER — PAROXETINE HCL 20 MG PO TABS
20.0000 mg | ORAL_TABLET | Freq: Every day | ORAL | Status: DC
  Administered 2013-08-05 – 2013-08-10 (×6): 20 mg via ORAL
  Filled 2013-08-04 (×7): qty 1

## 2013-08-04 MED ORDER — LIDOCAINE HCL (CARDIAC) 20 MG/ML IV SOLN
INTRAVENOUS | Status: DC | PRN
  Administered 2013-08-04 (×2): 30 mg via INTRAVENOUS

## 2013-08-04 MED ORDER — FUROSEMIDE 40 MG PO TABS
40.0000 mg | ORAL_TABLET | Freq: Two times a day (BID) | ORAL | Status: DC
  Administered 2013-08-04 – 2013-08-10 (×11): 40 mg via ORAL
  Filled 2013-08-04 (×17): qty 1

## 2013-08-04 MED ORDER — IODIXANOL 320 MG/ML IV SOLN
INTRAVENOUS | Status: DC | PRN
  Administered 2013-08-04: 80 mL via INTRA_ARTERIAL

## 2013-08-04 MED ORDER — ALBUMIN HUMAN 5 % IV SOLN
INTRAVENOUS | Status: DC | PRN
  Administered 2013-08-04: 11:00:00 via INTRAVENOUS

## 2013-08-04 MED ORDER — MORPHINE SULFATE 2 MG/ML IJ SOLN
2.0000 mg | INTRAMUSCULAR | Status: DC | PRN
  Administered 2013-08-04: 2 mg via INTRAVENOUS
  Administered 2013-08-04 – 2013-08-05 (×2): 4 mg via INTRAVENOUS
  Administered 2013-08-05: 2 mg via INTRAVENOUS
  Filled 2013-08-04 (×2): qty 1
  Filled 2013-08-04 (×2): qty 2

## 2013-08-04 MED ORDER — POTASSIUM CHLORIDE CRYS ER 20 MEQ PO TBCR: 20.0000 meq | EXTENDED_RELEASE_TABLET | Freq: Every day | ORAL | Status: DC | PRN

## 2013-08-04 MED ORDER — 0.9 % SODIUM CHLORIDE (POUR BTL) OPTIME
TOPICAL | Status: DC | PRN
  Administered 2013-08-04 (×2): 1000 mL

## 2013-08-04 MED ORDER — SODIUM CHLORIDE 0.9 % IV SOLN: 500.0000 mL | Freq: Once | INTRAVENOUS | Status: AC | PRN

## 2013-08-04 MED ORDER — VANCOMYCIN HCL IN DEXTROSE 1-5 GM/200ML-% IV SOLN: 1000.0000 mg | INTRAVENOUS | Status: DC

## 2013-08-04 MED ORDER — ETOMIDATE 2 MG/ML IV SOLN
INTRAVENOUS | Status: DC | PRN
  Administered 2013-08-04: 14 mg via INTRAVENOUS

## 2013-08-04 MED ORDER — METOPROLOL TARTRATE 1 MG/ML IV SOLN: 2.0000 mg | INTRAVENOUS | Status: DC | PRN

## 2013-08-04 MED ORDER — ACETAMINOPHEN 325 MG PO TABS: 325.0000 mg | ORAL_TABLET | ORAL | Status: DC | PRN

## 2013-08-04 MED ORDER — PROPOFOL 10 MG/ML IV BOLUS
INTRAVENOUS | Status: AC
  Filled 2013-08-04: qty 20

## 2013-08-04 MED ORDER — EPHEDRINE SULFATE 50 MG/ML IJ SOLN
INTRAMUSCULAR | Status: DC | PRN
  Administered 2013-08-04: 5 mg via INTRAVENOUS

## 2013-08-04 MED ORDER — ACETAMINOPHEN 650 MG RE SUPP: 325.0000 mg | RECTAL | Status: DC | PRN

## 2013-08-04 MED ORDER — POTASSIUM CHLORIDE CRYS ER 10 MEQ PO TBCR
10.0000 meq | EXTENDED_RELEASE_TABLET | Freq: Every day | ORAL | Status: DC
  Administered 2013-08-04 – 2013-08-10 (×7): 10 meq via ORAL
  Filled 2013-08-04 (×8): qty 1

## 2013-08-04 MED ORDER — ASPIRIN EC 325 MG PO TBEC
325.0000 mg | DELAYED_RELEASE_TABLET | Freq: Every day | ORAL | Status: DC
  Administered 2013-08-05 – 2013-08-10 (×6): 325 mg via ORAL
  Filled 2013-08-04 (×7): qty 1

## 2013-08-04 MED ORDER — GUAIFENESIN-DM 100-10 MG/5ML PO SYRP: 15.0000 mL | ORAL_SOLUTION | ORAL | Status: DC | PRN

## 2013-08-04 MED ORDER — SODIUM CHLORIDE 0.9 % IV SOLN
INTRAVENOUS | Status: DC
  Administered 2013-08-04: 17:00:00 via INTRAVENOUS

## 2013-08-04 MED ORDER — GLYCOPYRROLATE 0.2 MG/ML IJ SOLN
INTRAMUSCULAR | Status: DC | PRN
  Administered 2013-08-04: 0.4 mg via INTRAVENOUS

## 2013-08-04 MED ORDER — OXYCODONE HCL 5 MG PO TABS: 5.0000 mg | ORAL_TABLET | Freq: Once | ORAL | Status: DC | PRN

## 2013-08-04 MED ORDER — HYDRALAZINE HCL 20 MG/ML IJ SOLN
10.0000 mg | INTRAMUSCULAR | Status: DC | PRN
  Administered 2013-08-04: 17:00:00 via INTRAVENOUS
  Filled 2013-08-04: qty 1

## 2013-08-04 MED ORDER — ROCURONIUM BROMIDE 50 MG/5ML IV SOLN
INTRAVENOUS | Status: AC
  Filled 2013-08-04: qty 1

## 2013-08-04 MED ORDER — MIDAZOLAM HCL 2 MG/2ML IJ SOLN
INTRAMUSCULAR | Status: AC
  Filled 2013-08-04: qty 2

## 2013-08-04 MED ORDER — VANCOMYCIN HCL IN DEXTROSE 1-5 GM/200ML-% IV SOLN
1000.0000 mg | Freq: Once | INTRAVENOUS | Status: AC
  Administered 2013-08-05: 1000 mg via INTRAVENOUS
  Filled 2013-08-04: qty 200

## 2013-08-04 MED ORDER — ARTIFICIAL TEARS OP OINT
TOPICAL_OINTMENT | OPHTHALMIC | Status: DC | PRN
  Administered 2013-08-04: 1 via OPHTHALMIC

## 2013-08-04 MED ORDER — HYDROMORPHONE HCL PF 1 MG/ML IJ SOLN: 0.2500 mg | INTRAMUSCULAR | Status: DC | PRN

## 2013-08-04 MED ORDER — HEPARIN SODIUM (PORCINE) 1000 UNIT/ML IJ SOLN
INTRAMUSCULAR | Status: AC
  Filled 2013-08-04: qty 1

## 2013-08-04 MED ORDER — PANTOPRAZOLE SODIUM 40 MG PO TBEC
40.0000 mg | DELAYED_RELEASE_TABLET | Freq: Every day | ORAL | Status: DC
  Administered 2013-08-04 – 2013-08-10 (×7): 40 mg via ORAL
  Filled 2013-08-04 (×5): qty 1

## 2013-08-04 MED ORDER — ENOXAPARIN SODIUM 30 MG/0.3ML ~~LOC~~ SOLN
30.0000 mg | SUBCUTANEOUS | Status: DC
  Administered 2013-08-05 – 2013-08-09 (×5): 30 mg via SUBCUTANEOUS
  Filled 2013-08-04 (×7): qty 0.3

## 2013-08-04 MED ORDER — VECURONIUM BROMIDE 10 MG IV SOLR
INTRAVENOUS | Status: AC
  Filled 2013-08-04: qty 10

## 2013-08-04 MED ORDER — VANCOMYCIN HCL IN DEXTROSE 1-5 GM/200ML-% IV SOLN
INTRAVENOUS | Status: AC
  Administered 2013-08-04: 1000 mg via INTRAVENOUS
  Filled 2013-08-04: qty 200

## 2013-08-04 MED ORDER — ONDANSETRON HCL 4 MG/2ML IJ SOLN: 4.0000 mg | Freq: Four times a day (QID) | INTRAMUSCULAR | Status: DC | PRN

## 2013-08-04 MED ORDER — PHENOL 1.4 % MT LIQD: 1.0000 | OROMUCOSAL | Status: DC | PRN

## 2013-08-04 SURGICAL SUPPLY — 67 items
BAG BANDED W/RUBBER/TAPE 36X54 (MISCELLANEOUS) ×3 IMPLANT
BAG SNAP BAND KOVER 36X36 (MISCELLANEOUS) ×3 IMPLANT
BLADE 10 SAFETY STRL DISP (BLADE) ×3 IMPLANT
CANISTER SUCTION 2500CC (MISCELLANEOUS) ×3 IMPLANT
CATH ACCU-VU SIZ PIG 5F 100CM (CATHETERS) ×6 IMPLANT
CATH BEACON 5.038 65CM KMP-01 (CATHETERS) IMPLANT
CLIP TI MEDIUM 24 (CLIP) ×3 IMPLANT
CLIP TI WIDE RED SMALL 24 (CLIP) ×3 IMPLANT
COVER DOME SNAP 22 D (MISCELLANEOUS) ×3 IMPLANT
COVER MAYO STAND STRL (DRAPES) ×3 IMPLANT
COVER PROBE W GEL 5X96 (DRAPES) ×3 IMPLANT
COVER SURGICAL LIGHT HANDLE (MISCELLANEOUS) ×3 IMPLANT
DERMABOND ADVANCED (GAUZE/BANDAGES/DRESSINGS) ×1
DERMABOND ADVANCED .7 DNX12 (GAUZE/BANDAGES/DRESSINGS) ×2 IMPLANT
DEVICE CLOSURE PERCLS PRGLD 6F (VASCULAR PRODUCTS) ×4 IMPLANT
DRAPE TABLE COVER HEAVY DUTY (DRAPES) ×3 IMPLANT
DRSG TEGADERM 2-3/8X2-3/4 SM (GAUZE/BANDAGES/DRESSINGS) ×3 IMPLANT
DRYSEAL FLEXSHEATH 22FR 33CM (SHEATH) ×1
ELECT CAUTERY BLADE 6.4 (BLADE) ×3 IMPLANT
ELECT REM PT RETURN 9FT ADLT (ELECTROSURGICAL) ×6
ELECTRODE REM PT RTRN 9FT ADLT (ELECTROSURGICAL) ×4 IMPLANT
ENDOPROSTHESIS THORAC 31X31X15 (Endovascular Graft) ×4 IMPLANT
ENDOPROTH THORACIC 31X31X15 (Endovascular Graft) ×6 IMPLANT
GAUZE SPONGE 4X4 16PLY XRAY LF (GAUZE/BANDAGES/DRESSINGS) ×3 IMPLANT
GLOVE BIO SURGEON STRL SZ7.5 (GLOVE) ×6 IMPLANT
GLOVE BIOGEL PI IND STRL 7.0 (GLOVE) ×2 IMPLANT
GLOVE BIOGEL PI IND STRL 8 (GLOVE) ×4 IMPLANT
GLOVE BIOGEL PI INDICATOR 7.0 (GLOVE) ×1
GLOVE BIOGEL PI INDICATOR 8 (GLOVE) ×2
GOWN STRL REUS W/ TWL LRG LVL3 (GOWN DISPOSABLE) ×6 IMPLANT
GOWN STRL REUS W/TWL LRG LVL3 (GOWN DISPOSABLE) ×3
KIT BASIN OR (CUSTOM PROCEDURE TRAY) ×3 IMPLANT
KIT ROOM TURNOVER OR (KITS) ×3 IMPLANT
NEEDLE PERC 18GX7CM (NEEDLE) ×3 IMPLANT
NS IRRIG 1000ML POUR BTL (IV SOLUTION) ×6 IMPLANT
PACK AORTA (CUSTOM PROCEDURE TRAY) ×3 IMPLANT
PAD ARMBOARD 7.5X6 YLW CONV (MISCELLANEOUS) ×6 IMPLANT
PATCH VASCULAR VASCU GUARD 1X6 (Vascular Products) ×3 IMPLANT
PENCIL BUTTON HOLSTER BLD 10FT (ELECTRODE) IMPLANT
PERCLOSE PROGLIDE 6F (VASCULAR PRODUCTS) ×6
PROTECTION STATION PRESSURIZED (MISCELLANEOUS) ×3
SHEATH AVANTI 11CM 8FR (MISCELLANEOUS) ×3 IMPLANT
SHEATH DRYSEAL FLEX 22FR 33CM (SHEATH) ×2 IMPLANT
SPONGE SURGIFOAM ABS GEL 100 (HEMOSTASIS) IMPLANT
STAPLER VISISTAT 35W (STAPLE) IMPLANT
STATION PROTECTION PRESSURIZED (MISCELLANEOUS) ×2 IMPLANT
STOPCOCK MORSE 400PSI 3WAY (MISCELLANEOUS) ×3 IMPLANT
SUT PROLENE 5 0 C 1 24 (SUTURE) IMPLANT
SUT PROLENE 6 0 CC (SUTURE) ×9 IMPLANT
SUT VIC AB 2-0 CT1 27 (SUTURE) ×1
SUT VIC AB 2-0 CT1 TAPERPNT 27 (SUTURE) ×2 IMPLANT
SUT VIC AB 3-0 SH 27 (SUTURE) ×1
SUT VIC AB 3-0 SH 27X BRD (SUTURE) ×2 IMPLANT
SUT VICRYL 4-0 PS2 18IN ABS (SUTURE) ×6 IMPLANT
SYR 20CC LL (SYRINGE) ×6 IMPLANT
SYR 30ML LL (SYRINGE) IMPLANT
SYR 50ML LL SCALE MARK (SYRINGE) ×3 IMPLANT
SYR 5ML LL (SYRINGE) ×3 IMPLANT
SYR MEDRAD MARK V 150ML (SYRINGE) ×6 IMPLANT
SYRINGE 10CC LL (SYRINGE) ×9 IMPLANT
TOWEL OR 17X24 6PK STRL BLUE (TOWEL DISPOSABLE) ×6 IMPLANT
TOWEL OR 17X26 10 PK STRL BLUE (TOWEL DISPOSABLE) ×6 IMPLANT
TRAY FOLEY CATH 16FRSI W/METER (SET/KITS/TRAYS/PACK) ×3 IMPLANT
TUBING HIGH PRESSURE 120CM (CONNECTOR) ×3 IMPLANT
WIRE BENTSON .035X145CM (WIRE) ×3 IMPLANT
WIRE HI TORQ VERSACORE J 260CM (WIRE) ×3 IMPLANT
WIRE STIFF LUNDERQUIST 260CM (WIRE) ×3 IMPLANT

## 2013-08-04 NOTE — Anesthesia Preprocedure Evaluation (Signed)
Anesthesia Evaluation  Patient identified by MRN, date of birth, ID band Patient awake    Reviewed: Allergy & Precautions, H&P , NPO status , Patient's Chart, lab work & pertinent test results, reviewed documented beta blocker date and time   Airway Mallampati: II TM Distance: >3 FB Neck ROM: Full    Dental  (+) Upper Dentures, Dental Advisory Given   Pulmonary shortness of breath and with exertion, asthma ,  breath sounds clear to auscultation  + decreased breath sounds      Cardiovascular hypertension, Pt. on medications and Pt. on home beta blockers + CAD and + Peripheral Vascular Disease + dysrhythmias Rhythm:Regular Rate:Normal     Neuro/Psych    GI/Hepatic GERD-  ,  Endo/Other  diabetes  Renal/GU      Musculoskeletal   Abdominal Normal abdominal exam  (+)   Peds  Hematology  (+) anemia ,   Anesthesia Other Findings   Reproductive/Obstetrics                           Anesthesia Physical Anesthesia Plan  ASA: III  Anesthesia Plan: General   Post-op Pain Management:    Induction: Intravenous  Airway Management Planned: Oral ETT  Additional Equipment: Arterial line, CVP and PA Cath  Intra-op Plan:   Post-operative Plan: Extubation in OR and Possible Post-op intubation/ventilation  Informed Consent: I have reviewed the patients History and Physical, chart, labs and discussed the procedure including the risks, benefits and alternatives for the proposed anesthesia with the patient or authorized representative who has indicated his/her understanding and acceptance.   Dental advisory given  Plan Discussed with: Anesthesiologist and Surgeon  Anesthesia Plan Comments:         Anesthesia Quick Evaluation

## 2013-08-04 NOTE — Interval H&P Note (Signed)
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 After consideration of risks, benefits and other options for treatment, the patient has consented to  Procedure(s): THORACIC AORTIC ENDOVASCULAR STENT GRAFT (N/A) as a surgical intervention .  The patient's history has been reviewed, patient examined, no change in status, stable for surgery.  I have reviewed the patient's chart and labs.  Questions were answered to the patient's satisfaction.     FIELDS,CHARLES E

## 2013-08-04 NOTE — Op Note (Addendum)
Procedure: Thoracic aortic stent graft, left femoral endarterectomy, patch angioplasty bovine pericardial patch  Operative findings:   #1 Left femoral endarterectomy bovine patch   #2 31x31 x15 cm main body Gore TAG device delivered via left femoral system   #3 31 x 31 x15 extension   #4 graft extends from the left subclavian to the diaphragm  PROCEDURE DETAIL: After obtaining informed consent the patient was taken to the operating room. The patient was placed in supine position the operating room table. After induction of general anesthesia and endotracheal intubation a Foley catheter was placed. Next the patient was prepped and draped in usual sterile fashion from the nipples down to the knees. Ultrasound was used to identify the left common femoral artery as well as the femoral bifurcation. An 11 blade was used to make a small neck in the skin over the level of the left common femoral artery. An introducer needle was then used to cannulate the left common femoral artery without difficulty. A 0.035 Bentson wire was then threaded up into the abdominal aorta through the left femoral system. A long 9 French dilator was placed over the guidewire the left femoral system. This was used to dilate the tract. The dilator was then removed and a Proglide device inserted over the guidewire into the left femoral system and this was deployed at the 10:00 position. The Proglide device was then removed and an additional Proglide device was brought in operative field and deployed at the 2:00 position. The sutures were kept separate and tagged with suture tags. Next the short 9 French sheath was then placed back over the guidewire into the left femoral system and the dilator removed and the sheath thoroughly flushed with heparinized saline.   At this point, a 5 [place] Omni flush catheter was placed over the guidewire and the left groin up into the ascending aorta. An aortogram was obtained in the LAO 54 degrees  position to determine level of the left subclavian and extent of aneurysm. At this point a 31 x 31 x 15 Gore TAG device was selected. An 035 Lunderquist wire from the left groin was advanced up into the descending thoracic aorta and into the ascending aorta. The patient was given 10000 units of IV heparin.  After 2 minutes, a 22 Fr dry seal sheath was placed over the guidewire up to the mid abdominal aorta. There was some resistance due to friction in the left iliac system but the device went up relatively uneventful.  The TAG device was then placed over the Lunderquist wire groin and advanced up to the level of the left subclavian artery.  The stent graft was then deployed with the end of the stent just below the level of the left subclavian artery. The delivery system was removed and the pigtail  catheter was then placed in the ascending aorta to measure for the distal landing zone in the aorta. An additional 31 x 31 x 15 was selected based on these measurements.  The Lunderquist wire was placed back through the pigtail catheter and back to the ascending aorta. The additional stent was then deployed in the usual fashion down to the distal descending thoracic aorta. The delivery system was then removed. The Gore tri lobe balloon was advanced over the guidewire and the proximal and distal attachment sites as well as the overlapping stent segment were ballooned to profile. The 5 French pigtail catheter was then placed back to the guidewire and a completion arteriogram was obtained. This showed  no evidence of proximal or distal type I endoleak. There was no type II endoleak. There is no filling of the aneurysm sac. The left subclavian was patent with the stent extending from just distal to the left subclavian to the diaphragm with about 5 cm of stent overlap. The pigtail was pulled back over a guidewire and out.   We then proceeded to remove the large 22 French sheath from the left side with the guidewire in place.  With pressure held above and below the insertion site the lateral Proglide closure was secured down.  There was still bleeding.  An additional AP Proglide was then placed over the guidewire and deployed.  There was essentially no hemostasis.  A 12 French dryseal sheath was placed over the wire to achieve hemostasis. At this point I decided to perform a femoral cutdown to repair the artery.  An oblique incision was performed in the left groin.  The incision was carried down through the subcutaneous tissues down to level the left common femoral artery. Dissection was fairly difficult the tube using around the sheath. I was able to place a vessel loop around the common femoral artery at the level of the inguinal ligament. I was unable to define the level of the distal common femoral artery and a vessel loop was placed around this as well. The patient was given an additional 5000 units of heparin. The sheath was then removed and the artery controlled proximally and distally with vessel loops. The proximal vessel loop was loosened and the artery controlled proximally with a Henley clamp. Inspection of the artery showed that there was significant plaque burden in the common femoral artery and this had become disrupted from the sheath. I dissected further down the common femoral artery and was able to dissect around the superficial femoral and profunda femoris arteries and placed vessel loops around these. Then proceeded to open the artery longitudinally and performed a left common femoral endarterectomy. A good endpoint was obtained in the superficial and profunda femoris arteries distally. A good proximal endpoint was also obtained. All loose debris was removed from the artery. The patient was also given an additional 3000 units of heparin an ACT was confirmed to be 217.  A bovine pericardial patch was brought in operative field and a patch angioplasty was constructed using a running 6-0 Prolene suture. Just prior  completion anastomosis was forebled backbled and thoroughly flushed the anastomosis was secured clamps released there was some bleeding from the proximal portion of the patch where the artery was quite and out. A pledgeted suture was placed in this location to obtain hemostasis. Doppler confirmed that she had good flow to the posterior tibial artery of the left foot.  The patient was given 50 mg of protamine. The groin was then closed in multiple layers using running 2 0 and 3 0 Vicryl suture. The skin was closed with a  4 0 Vicryl subcuticular stitch.  The patient tolerated procedure well and there were no complications. Instrument sponge and needle counts correct in the case. Patient was awakened in the operating room extubated and taken to the recovery room in stable condition.  [name], MD [facility] Office: [phone] Pager: [phone]

## 2013-08-04 NOTE — Progress Notes (Signed)
Utilization Review Completed.Dowell, Deborah T6/24/2015  

## 2013-08-04 NOTE — Progress Notes (Signed)
PT doppler left foot in PACU Moving lower extremities 5/5 motor To 3S  [name], MD [facility] [place] Office: [phone] Pager: [phone]

## 2013-08-04 NOTE — Transfer of Care (Signed)
Immediate Anesthesia Transfer of Care Note  Patient: Lori York  Procedure(s) Performed: Procedure(s): THORACIC AORTIC ENDOVASCULAR STENT GRAFT (N/A) ENDARTERECTOMY FEMORAL (Left) PATCH ANGIOPLASTY, using bovine patch (Left)  Patient Location: PACU  Anesthesia Type:General  Level of Consciousness: awake, patient cooperative and responds to stimulation  Airway & Oxygen Therapy: Patient Spontanous Breathing and Patient connected to nasal cannula oxygen  Post-op Assessment: Report given to PACU RN and Post -op Vital signs reviewed and stable  Post vital signs: Reviewed and stable  Complications: No apparent anesthesia complications

## 2013-08-04 NOTE — Anesthesia Postprocedure Evaluation (Signed)
  Anesthesia Post-op Note  Patient: Lori York  Procedure(s) Performed: Procedure(s): THORACIC AORTIC ENDOVASCULAR STENT GRAFT (N/A) ENDARTERECTOMY FEMORAL (Left) PATCH ANGIOPLASTY, using bovine patch (Left)  Patient Location: PACU  Anesthesia Type:General  Level of Consciousness: awake and alert   Airway and Oxygen Therapy: Patient Spontanous Breathing  Post-op Pain: none  Post-op Assessment: Post-op Vital signs reviewed, Patient's Cardiovascular Status Stable and Respiratory Function Stable  Post-op Vital Signs: Reviewed  Filed Vitals:   08/04/13 1415  BP:   Pulse: 61  Temp:   Resp: 15    Complications: No apparent anesthesia complications

## 2013-08-04 NOTE — H&P (View-Only) (Signed)
[facility] [place] HISTORY AND PHYSICAL   History of Present Illness:   Patient is a 68 year old female who is status post open abdominal aortic aneurysm repair June of 2013. This was a tube graft. She returns today for followup. She has a periumbilical area that had been draining intermittently. This is now resolved.  She states her walking distance is good. She denies any abdominal or back pain. CT scan of the abdomen showed a small periumbilical hernia. She also returns today with a followup CT scan of the chest for further evaluation of an ectatic descending thoracic aorta.  Other chronic medical problems include history of AICD, hypertension, diabetes, coronary disease status post grafting, ischemic cardiomyopathy EF 20-25% and 2012, hyperlipidemia, asthma, sleep apnea all which are currently stable.  Past Medical History  Diagnosis Date  . Long Q-T syndrome     a.  w fx of sudden cardiac arrest;  b. s/p Guidant AICD  . HTN (hypertension)   . Diabetes mellitus   . Glaucoma   . Noncompliance   . CAD (coronary artery disease)     a.  LHC 01/04/11: dLM 20%, pLAD 90%, oD1 80%, RCA 80%, EF 35-40%.;  b. 02/2011 CABG x 3: LIMA->LAD, VG->DIAG, VG->Dist RCA  . Ischemic cardiomyopathy     a. 12/2010 Echo: EF 20-25%, sept/apical AK, Sev Dil LV  . Chronic systolic heart failure   . AAA (abdominal aortic aneurysm)     a. Abd u/s 01/07/11: 5.3x5.5 cm infrarenal fusiform AAA.;  b.06/2011: CTA Abd: 6.2 cm infrarenal AAA  . HLD (hyperlipidemia)   . Asthma   . Renal artery stenosis     a.  LHC 11/12: prox left RA 80% stenosis  . Seizures     in 20's elevated bp  . GERD (gastroesophageal reflux disease)   . Fibromyalgia   . Anemia   . Peripheral vascular disease   . Pleural effusion, left     a. s/p thoracentesis x 2 04/2011 - total of 2.5L taken off  . Sleep apnea     sleep study 2 yrs ago sehv  . Polymorphic ventricular tachycardia     s/p appropriate ICD therapy     Past Surgical History  Procedure Laterality Date  . Cardiac defibrillator placement      in 1997 with revision to a  Guidant model 1860 single chamber implantable cardioverter- defib on Feb18, 2003, most recent generator change 2/11 by Dr Johney FrameAllred SJM  . Laminectomy  May 2008  . Right knee resconstructuion  1970  . Laminectomy  11/20/07     LF5-S1 fusion  . Back surgery    . Tonsillectomy    . Abdominal hysterectomy  88  . Hemorroidectomy  77  . Coronary artery bypass graft  03/07/2011    Procedure: CORONARY ARTERY BYPASS GRAFTING (CABG);  Surgeon: Purcell Nailslarence H Owen, MD;  [facility];  Service: Open Heart Surgery;  Laterality: N/A;  Coronary artery bypass graft times three using left internal mammary artery and left leg greater saphenous vein harvested endoscopically  . Abdominal aortic aneurysm repair  07/29/2011    Procedure: ANEURYSM ABDOMINAL AORTIC REPAIR;  Surgeon: Sherren Kernsharles E Alcie Runions, MD;  [facility];  Service: Vascular;  Laterality: N/A;  Resection and grafting Abdominal Aortic Aneurysm with straight 18mm x 30cm hemasheild graft    Social History History  Substance Use Topics  . Smoking status: Former Smoker -- 0.30 packs/day    Types: Cigarettes    Quit date: 01/12/2011  .  Smokeless tobacco: Never Used     Comment:    . Alcohol Use: No    Family History Family History  Problem Relation Age of Onset  . Long QT syndrome Mother   . Heart disease Mother     before age 56  . Hypertension Mother   . Long QT syndrome Sister   . Heart disease Sister   . Long QT syndrome Brother   . Heart disease Brother   . Heart attack Brother   . Long QT syndrome Daughter   . Heart disease Daughter   . Coronary artery disease Sister 10    Allergies  Allergies  Allergen Reactions  . Tape Other (See Comments)    Pt states she struggles with removing the adhesive...   . Penicillins Rash     Current Outpatient Prescriptions  Medication Sig Dispense Refill  . aspirin 325  MG EC tablet Take 325 mg by mouth daily.      . carvedilol (COREG) 25 MG tablet take 1 and 1/2 tablet by mouth twice a day  90 tablet  1  . furosemide (LASIX) 40 MG tablet Take 1 tablet (40 mg total) by mouth daily.  30 tablet    . furosemide (LASIX) 40 MG tablet take 1 tablet by mouth twice a day  180 tablet  1  . hydrALAZINE (APRESOLINE) 50 MG tablet Take 50 mg by mouth 3 (three) times daily.      . hydrALAZINE (APRESOLINE) 50 MG tablet take 1 tablet by mouth three times a day  270 tablet  2  . nitroGLYCERIN (NITROSTAT) 0.4 MG SL tablet Place 0.4 mg under the tongue every 5 (five) minutes as needed.      Marland Kitchen PARoxetine (PAXIL) 20 MG tablet Take 20 mg by mouth daily.       . potassium chloride SA (K-DUR,KLOR-CON) 20 MEQ tablet Take 10 mEq by mouth daily.       . potassium chloride SA (KLOR-CON M20) 20 MEQ tablet Take 0.5 tablets (10 mEq total) by mouth daily.  45 tablet  0  . zolpidem (AMBIEN) 10 MG tablet Take 10 mg by mouth at bedtime as needed. For sleep        No current facility-administered medications for this visit.    ROS:   General:  No weight loss, Fever, chills  HEENT: No recent headaches, no nasal bleeding, no visual changes, no sore throat  Neurologic: No dizziness, blackouts, seizures. No recent symptoms of stroke or mini- stroke. No recent episodes of slurred speech, or temporary blindness.  Cardiac: No recent episodes of chest pain/pressure, no shortness of breath at rest.  No shortness of breath with exertion.  Denies history of atrial fibrillation or irregular heartbeat  Vascular: No history of rest pain in feet.  No history of claudication.  No history of non-healing ulcer, No history of DVT   Pulmonary: No home oxygen, no productive cough, no hemoptysis,  No asthma or wheezing  Musculoskeletal:  [ ]  Arthritis, [ ]  Low back pain,  [ ]  Joint pain  Hematologic:No history of hypercoagulable state.  No history of easy bleeding.  No history of anemia  Gastrointestinal:  No hematochezia or melena,  No gastroesophageal reflux, no trouble swallowing  Urinary: [ ]  chronic Kidney disease, [ ]  on HD - [ ]  MWF or [ ]  TTHS, [ ]  Burning with urination, [ ]  Frequent urination, [ ]  Difficulty urinating;   Skin: No rashes  Psychological: No history of anxiety,  No  history of depression   Physical Examination  Filed Vitals:   07/15/13 1138  BP: 148/80  Pulse: 70  Temp: 98.2 F (36.8 C)  TempSrc: Oral  Height: 5\' 4"  (1.626 m)  Weight: 171 lb (77.565 kg)  SpO2: 100%    Body mass index is 29.34 kg/(m^2).  General:  Alert and oriented, no acute distress HEENT: Normal Neck: No bruit or JVD Pulmonary: Clear to auscultation bilaterally Cardiac: Regular Rate and Rhythm without murmur Abdomen: Soft, non-tender, non-distended, no mass, well-healed midline scar Skin: No rash Extremity Pulses:  2+ radial, brachial, femoral pulses bilaterally Musculoskeletal: No deformity or edema  Neurologic: Upper and lower extremity motor 5/5 and symmetric  DATA:  CT scan of the chest abdomen and pelvis from earlier this week is reviewed. This shows a 5.7 cm saccular descending thoracic aneurysm. Abdominal aorta was 3.8 x 3.4 cm. Right renal artery stenosis. Left renal artery stenosis. 50% stenosis right distal external iliac artery just above the groin crease. Ectatic right common iliac artery to   ASSESSMENT:  5.7 cm descending thoracic aortic aneurysm. This is currently asymptomatic a large enough to consider repair. It appears to be amenable to possible percutaneous stent graft repair. The patient may have right external iliac artery stenosis of access will most likely be via the left femoral system.   PLAN:  We will make measurements of the films and determine whether or not she is a stent graft candidate. We will schedule her for cardiac risk stratification by Dr. Johney Frame. If her anatomy is not suitable for stent graft repair we may continue to observe every 6 months into  the aneurysm is greater than 6-6.5 cm in diameter.  [name], MD [facility] [place] Office: [phone] Pager: [phone]

## 2013-08-05 ENCOUNTER — Encounter (HOSPITAL_COMMUNITY): Payer: Self-pay | Admitting: Vascular Surgery

## 2013-08-05 LAB — CBC
HCT: 25.9 % — ABNORMAL LOW (ref 36.0–46.0)
HEMOGLOBIN: 8.4 g/dL — AB (ref 12.0–15.0)
MCH: 29.8 pg (ref 26.0–34.0)
MCHC: 32.4 g/dL (ref 30.0–36.0)
MCV: 91.8 fL (ref 78.0–100.0)
Platelets: 154 10*3/uL (ref 150–400)
RBC: 2.82 MIL/uL — ABNORMAL LOW (ref 3.87–5.11)
RDW: 14.5 % (ref 11.5–15.5)
WBC: 9.9 10*3/uL (ref 4.0–10.5)

## 2013-08-05 LAB — BASIC METABOLIC PANEL
BUN: 19 mg/dL (ref 6–23)
CO2: 25 mEq/L (ref 19–32)
Calcium: 8.9 mg/dL (ref 8.4–10.5)
Chloride: 100 mEq/L (ref 96–112)
Creatinine, Ser: 1.51 mg/dL — ABNORMAL HIGH (ref 0.50–1.10)
GFR calc Af Amer: 41 mL/min — ABNORMAL LOW (ref >90)
GFR, EST NON AFRICAN AMERICAN: 35 mL/min — AB (ref >90)
GLUCOSE: 144 mg/dL — AB (ref 70–99)
Potassium: 3.5 mEq/L — ABNORMAL LOW (ref 3.7–5.3)
SODIUM: 137 meq/L (ref 137–147)

## 2013-08-05 MED ORDER — OXYCODONE HCL 5 MG PO TABS
5.0000 mg | ORAL_TABLET | Freq: Four times a day (QID) | ORAL | Status: DC | PRN
  Administered 2013-08-05 – 2013-08-10 (×6): 5 mg via ORAL
  Filled 2013-08-05 (×6): qty 1

## 2013-08-05 MED ORDER — OXYCODONE HCL 5 MG PO TABS: 5.0000 mg | ORAL_TABLET | Freq: Four times a day (QID) | ORAL | Status: DC | PRN

## 2013-08-05 NOTE — Progress Notes (Signed)
D/c'd Arterial line, foley, and right central line per MD order. Pt tolerated all procedures well. Will continue to monitor.

## 2013-08-05 NOTE — Progress Notes (Addendum)
  [facility] Progress Note  08/05/2013 7:21 AM 1 Day Post-Op  Subjective:  Ready to go home.  States she has a lot of gas.  Wants to walk.  Tm 101.6 now 100.4 120's-190's systolic  (199 systolic 5pm yesterday) HR 60's-80's regular 98% 2LO2NC   Filed Vitals:   08/05/13 0351  BP: 129/63  Pulse: 77  Temp: 100.4 F (38 C)  Resp: 22    Physical Exam: Incisions:  Left groin incision is c/d/i Cardiac:  RRR Lungs:  Non labored Abdomen:  Soft/NT/ND Extremities:  Bilateral feet are warm; 2+ right DP; right PT, left DP/PT + doppler signal  CBC    Component Value Date/Time   WBC 9.9 08/05/2013 0630   RBC 2.82* 08/05/2013 0630   HGB 8.4* 08/05/2013 0630   HCT 25.9* 08/05/2013 0630   PLT 154 08/05/2013 0630   MCV 91.8 08/05/2013 0630   MCH 29.8 08/05/2013 0630   MCHC 32.4 08/05/2013 0630   RDW 14.5 08/05/2013 0630   LYMPHSABS 1.3 12/31/2010 1301   MONOABS 0.6 12/31/2010 1301   EOSABS 0.0 12/31/2010 1301   BASOSABS 0.0 12/31/2010 1301    BMET    Component Value Date/Time   NA 140 08/04/2013 1330   K 4.0 08/04/2013 1330   CL 105 08/04/2013 1330   CO2 24 08/04/2013 1330   GLUCOSE 162* 08/04/2013 1330   BUN 22 08/04/2013 1330   CREATININE 1.50* 08/04/2013 1330   CREATININE 1.57* 07/13/2013 1127   CALCIUM 8.9 08/04/2013 1330   GFRNONAA 35* 08/04/2013 1330   GFRAA 41* 08/04/2013 1330    INR    Component Value Date/Time   INR 1.24 08/04/2013 1330     Intake/Output Summary (Last 24 hours) at 08/05/13 1856 Last data filed at 08/05/13 0355  Gross per 24 hour  Intake 3617.5 ml  Output   2385 ml  Net 1232.5 ml     Assessment:  68 y.o. female is s/p:  Thoracic aortic stent graft, left femoral endarterectomy, patch angioplasty bovine pericardial patch  1 Day Post-Op  Plan: -doing well this am-wants to go home -fever overnight-most likely perioperative phase -Acute surgical blood loss anemia-BP tolerating -BUN/Cr improved from earlier this month-good UOP -d/c  foley this am, OOB to chair and ambulate -d/c a-line -home later today -DVT prophylaxis:  Lovenox to start this afternoon -had discussion with pt about keeping left groin clean and dry-place dry gauze or dry washcloth to that area to keep dry to help prevent infection-she expresses understanding.   [name], PA-C [facility] [phone] 08/05/2013 7:21 AM   Exam findings as above Pt has now decided she needs one more day to work out soreness of incision Will transfer to 2W with planned d/c in am  [name], MD [facility] [place] Office: [phone] Pager: [phone]

## 2013-08-05 NOTE — Progress Notes (Signed)
Pt transferred to 2W32 per MD order. Report called to receiving nurse and all questions answered.

## 2013-08-05 NOTE — Progress Notes (Signed)
INITIAL NUTRITION ASSESSMENT  DOCUMENTATION CODES Per approved criteria  -Not Applicable   INTERVENTION: -Encourage adequate intake of meals.  -Patient advised to order snacks as desired.   NUTRITION DIAGNOSIS: Inadequate oral intake related to decreased appetite as evidenced by 50% meal intake.   Goal: Patient will meet >/=90% of estimated nutrition needs  Monitor:  PO intake, weight, labs, I/Os  Reason for Assessment: Malnutrition screening tool  68 y.o. female  Admitting Dx: Thoracic aneurysm  ASSESSMENT: 68 year old female who is status post open abdominal aortic aneurysm repair June of 2013. She presents for surgery of thoracic aneurysm. Patient is s/p thoracic aortic endovascular stent graft.   Patient reports that her appetite has been poor for the last 3-4 months with 6-8 pounds weight loss over that time. Intake has improved, but she is still only eating 50% of meals. This is also likely secondary to food preferences. She is complaining of gas pain currently. No evidence of muscle or subcutaneous fat wasting.   Likely discharge tomorrow AM.   Height: Ht Readings from Last 1 Encounters:  08/04/13 5\' 5"  (1.651 m)    Weight: Wt Readings from Last 1 Encounters:  08/04/13 180 lb 1.9 oz (81.7 kg)    Ideal Body Weight: 125 pounds  % Ideal Body Weight: 144%  Wt Readings from Last 10 Encounters:  08/04/13 180 lb 1.9 oz (81.7 kg)  08/04/13 180 lb 1.9 oz (81.7 kg)  07/29/13 184 lb (83.462 kg)  07/27/13 175 lb 14.4 oz (79.788 kg)  07/16/13 184 lb (83.462 kg)  07/15/13 171 lb (77.565 kg)  02/25/13 171 lb (77.565 kg)  01/28/13 163 lb 8 oz (74.163 kg)  08/11/12 162 lb 1.9 oz (73.537 kg)  04/03/12 158 lb 3.2 oz (71.759 kg)    Usual Body Weight: Unknown  % Usual Body Weight:   BMI:  Body mass index is 29.97 kg/(m^2). Patient is overweight  Estimated Nutritional Needs: Kcal: 1650-1800 kcal Protein: 95-110 g Fluid: >2.4 L/day  Skin: surgical  incision  Diet Order:  Heart Healthy  EDUCATION NEEDS: -No education needs identified at this time   Intake/Output Summary (Last 24 hours) at 08/05/13 1630 Last data filed at 08/05/13 0854  Gross per 24 hour  Intake  977.5 ml  Output   1720 ml  Net -742.5 ml    Last BM: PTA   Labs:   Recent Labs Lab 08/04/13 1155 08/04/13 1330 08/05/13 0630  NA 142 140 137  K 3.2* 4.0 3.5*  CL  --  105 100  CO2  --  24 25  BUN  --  22 19  CREATININE  --  1.50* 1.51*  CALCIUM  --  8.9 8.9  MG  --  1.8  --   GLUCOSE 107* 162* 144*    CBG (last 3)   Recent Labs  08/04/13 0638 08/04/13 1324  GLUCAP 110* 147*    Scheduled Meds: . aspirin  325 mg Oral Daily  . carvedilol  37.5 mg Oral BID WC  . docusate sodium  100 mg Oral Daily  . enoxaparin (LOVENOX) injection  30 mg Subcutaneous Q24H  . furosemide  40 mg Oral BID  . hydrALAZINE  50 mg Oral TID  . pantoprazole  40 mg Oral Daily  . PARoxetine  20 mg Oral Daily  . potassium chloride SA  10 mEq Oral Daily    Continuous Infusions:   Past Medical History  Diagnosis Date  . Long Q-T syndrome     a.  w fx of sudden cardiac arrest;  b. s/p Guidant AICD  . HTN (hypertension)   . Glaucoma   . Noncompliance   . CAD (coronary artery disease)     a.  LHC 01/04/11: dLM 20%, pLAD 90%, oD1 80%, RCA 80%, EF 35-40%.;  b. 02/2011 CABG x 3: LIMA->LAD, VG->DIAG, VG->Dist RCA  . Ischemic cardiomyopathy     a. 12/2010 Echo: EF 20-25%, sept/apical AK, Sev Dil LV  . Chronic systolic heart failure   . AAA (abdominal aortic aneurysm)     a. Abd u/s 01/07/11: 5.3x5.5 cm infrarenal fusiform AAA.;  b.06/2011: CTA Abd: 6.2 cm infrarenal AAA  . HLD (hyperlipidemia)   . Asthma   . Renal artery stenosis     a.  LHC 11/12: prox left RA 80% stenosis  . Seizures     in 20's elevated bp  . GERD (gastroesophageal reflux disease)   . Anemia   . Peripheral vascular disease   . Pleural effusion, left     a. s/p thoracentesis x 2 04/2011 - total of  2.5L taken off  . Polymorphic ventricular tachycardia     s/p appropriate ICD therapy  . Dysrhythmia   . Automatic implantable cardioverter-defibrillator in situ   . Sleep apnea     "went for testing ~ 2013; don't have mask" (08/04/2013)  . Type II diabetes mellitus     loss weigh after surgery, not on diabetic meds (08/04/2013)  . Peripheral neuropathy   . Fibromyalgia     "not sure" (6/245/2015)  . Anxiety     Past Surgical History  Procedure Laterality Date  . Cardiac defibrillator placement  1997-2011    in 1997 with revision to a  Guidant model 1860 single chamber implantable cardioverter- defib on Feb18, 2003, most recent generator change 2/11 by Dr Johney Frame SJM  . Lumbar laminectomy  May 2008    Dr. Ophelia Charter  . Knee reconstruction Right 11/1968  . Posterior lumbar fusion  11/20/07     LF5-S1, Dr. Ophelia Charter  . Back surgery    . Abdominal hysterectomy  1986  . Hemorroidectomy  1975  . Coronary artery bypass graft  03/07/2011    Procedure: CORONARY ARTERY BYPASS GRAFTING (CABG);  Surgeon: Purcell Nails, MD;  [facility];  Service: Open Heart Surgery;  Laterality: N/A;  Coronary artery bypass graft times three using left internal mammary artery and left leg greater saphenous vein harvested endoscopically  . Abdominal aortic aneurysm repair  07/29/2011    Procedure: ANEURYSM ABDOMINAL AORTIC REPAIR;  Surgeon: Sherren Kerns, MD;  Location: [facility] OR;  Service: Vascular;  Laterality: N/A;  Resection and grafting Abdominal Aortic Aneurysm with straight 82mm x 30cm hemasheild graft  . Tonsillectomy and adenoidectomy  1954  . Thoracic aortic endovascular stent graft  08/04/2013  . Thoracentesis  04/2011 X 2  . Thoracic aortic endovascular stent graft N/A 08/04/2013    Procedure: THORACIC AORTIC ENDOVASCULAR STENT GRAFT;  Surgeon: Sherren Kerns, MD;  Location: [facility] OR;  Service: Vascular;  Laterality: N/A;  . Endarterectomy femoral Left 08/04/2013    Procedure: ENDARTERECTOMY FEMORAL;  Surgeon:  Sherren Kerns, MD;  Location: [facility] OR;  Service: Vascular;  Laterality: Left;  . Patch angioplasty Left 08/04/2013    Procedure: PATCH ANGIOPLASTY, using bovine patch;  Surgeon: Sherren Kerns, MD;  Location: [facility] OR;  Service: Vascular;  Laterality: Left;    Linnell Fulling, RD, LDN Pager #: [phone] After-Hours Pager #: [phone]

## 2013-08-06 LAB — CBC
HCT: 25.9 % — ABNORMAL LOW (ref 36.0–46.0)
Hemoglobin: 8.4 g/dL — ABNORMAL LOW (ref 12.0–15.0)
MCH: 30.3 pg (ref 26.0–34.0)
MCHC: 32.4 g/dL (ref 30.0–36.0)
MCV: 93.5 fL (ref 78.0–100.0)
Platelets: 184 10*3/uL (ref 150–400)
RBC: 2.77 MIL/uL — AB (ref 3.87–5.11)
RDW: 14.5 % (ref 11.5–15.5)
WBC: 14.9 10*3/uL — ABNORMAL HIGH (ref 4.0–10.5)

## 2013-08-06 MED ORDER — CIPROFLOXACIN HCL 500 MG PO TABS
500.0000 mg | ORAL_TABLET | Freq: Two times a day (BID) | ORAL | Status: DC
  Administered 2013-08-06 – 2013-08-08 (×4): 500 mg via ORAL
  Filled 2013-08-06 (×6): qty 1

## 2013-08-06 MED ORDER — VANCOMYCIN HCL IN DEXTROSE 1-5 GM/200ML-% IV SOLN: 1000.0000 mg | Freq: Two times a day (BID) | INTRAVENOUS | Status: DC

## 2013-08-06 MED ORDER — VANCOMYCIN HCL 10 G IV SOLR
1250.0000 mg | INTRAVENOUS | Status: DC
  Administered 2013-08-06 – 2013-08-07 (×2): 1250 mg via INTRAVENOUS
  Filled 2013-08-06 (×3): qty 1250

## 2013-08-06 MED ORDER — VANCOMYCIN HCL 10 G IV SOLR
1250.0000 mg | INTRAVENOUS | Status: DC
  Filled 2013-08-06: qty 1250

## 2013-08-06 MED ORDER — POLYETHYLENE GLYCOL 3350 17 G PO PACK
17.0000 g | PACK | Freq: Every day | ORAL | Status: DC | PRN
  Administered 2013-08-06 – 2013-08-09 (×2): 17 g via ORAL
  Filled 2013-08-06 (×2): qty 1

## 2013-08-06 NOTE — Progress Notes (Signed)
  [facility] Progress Note  08/06/2013 8:47 AM 2 Days Post-Op  Subjective:  Only complaint is some numbness on the top of her left foot  Tm 100.1 now 99.6 HR 70's-90's 110's-120's systolic 92% RA  Filed Vitals:   08/06/13 0432  BP: 123/63  Pulse: 91  Temp: 99.6 F (37.6 C)  Resp: 18    Physical Exam: Incisions:  Left groin is c/d/i-there is a dry dressing in place. Extremities:  Bilateral feet are warm; + doppler signals bilateral PT/DP Lungs:  Decreased BS mid left lung, otherwise CTA  CBC    Component Value Date/Time   WBC 9.9 08/05/2013 0630   RBC 2.82* 08/05/2013 0630   HGB 8.4* 08/05/2013 0630   HCT 25.9* 08/05/2013 0630   PLT 154 08/05/2013 0630   MCV 91.8 08/05/2013 0630   MCH 29.8 08/05/2013 0630   MCHC 32.4 08/05/2013 0630   RDW 14.5 08/05/2013 0630   LYMPHSABS 1.3 12/31/2010 1301   MONOABS 0.6 12/31/2010 1301   EOSABS 0.0 12/31/2010 1301   BASOSABS 0.0 12/31/2010 1301    BMET    Component Value Date/Time   NA 137 08/05/2013 0630   K 3.5* 08/05/2013 0630   CL 100 08/05/2013 0630   CO2 25 08/05/2013 0630   GLUCOSE 144* 08/05/2013 0630   BUN 19 08/05/2013 0630   CREATININE 1.51* 08/05/2013 0630   CREATININE 1.57* 07/13/2013 1127   CALCIUM 8.9 08/05/2013 0630   GFRNONAA 35* 08/05/2013 0630   GFRAA 41* 08/05/2013 0630    INR    Component Value Date/Time   INR 1.24 08/04/2013 1330     Intake/Output Summary (Last 24 hours) at 08/06/13 0847 Last data filed at 08/05/13 1600  Gross per 24 hour  Intake    360 ml  Output    600 ml  Net   -240 ml   PCXR 08/04/13: Patchy left lung atelectasis or infiltrate. Right base atelectasis   Assessment:  68 y.o. female is s/p:  Thoracic aortic stent graft, left femoral endarterectomy, patch angioplasty bovine pericardial patch   2 Days Post-Op  Plan: -pt has ambulated and ready to go home -CXR with atelectasis vs. Infiltrate left lung-d/c home on ABx -DVT prophylaxis:  Lovenox -f/u with Dr. Darrick Penna  in 4 weeks with CTA protocol   [name], PA-C [facility] [phone] 08/06/2013 8:47 AM

## 2013-08-06 NOTE — Progress Notes (Signed)
ANTIBIOTIC CONSULT NOTE - INITIAL  Pharmacy Consult for Vancomycin Indication: rule out pneumonia  Allergies  Allergen Reactions  . Tape Other (See Comments)    Pt states she struggles with removing the adhesive...   . Penicillins Rash    Patient Measurements: Height: 5\' 5"  (165.1 cm) Weight: 180 lb 1.9 oz (81.7 kg) IBW/kg (Calculated) : 57 Adjusted Body Weight:   Vital Signs: Temp: 98.1 F (36.7 C) (06/26 1236) Temp src: Oral (06/26 1236) BP: 109/50 mmHg (06/26 1236) Pulse Rate: 87 (06/26 1236) Intake/Output from previous day: 06/25 0701 - 06/26 0700 In: 360 [P.O.:360] Out: 600 [Urine:600] Intake/Output from this shift: Total I/O In: 480 [P.O.:480] Out: -   Labs:  Recent Labs  08/04/13 1330 08/05/13 0630 08/06/13 1115  WBC 5.7 9.9 14.9*  HGB 9.4* 8.4* 8.4*  PLT 176 154 184  CREATININE 1.50* 1.51*  --    Estimated Creatinine Clearance: 39.2 ml/min (by C-G formula based on Cr of 1.51). No results found for this basename: VANCOTROUGH, Leodis BinetVANCOPEAK, VANCORANDOM, GENTTROUGH, GENTPEAK, GENTRANDOM, TOBRATROUGH, TOBRAPEAK, TOBRARND, AMIKACINPEAK, AMIKACINTROU, AMIKACIN,  in the last 72 hours   Microbiology: Recent Results (from the past 720 hour(s))  SURGICAL PCR SCREEN     Status: None   Collection Time    07/27/13  2:50 PM      Result Value Ref Range Status   MRSA, PCR NEGATIVE  NEGATIVE Final   Staphylococcus aureus NEGATIVE  NEGATIVE Final   Comment:            The Xpert SA Assay (FDA     approved for NASAL specimens     in patients over 66 years of age),     is one component of     a comprehensive surveillance     program.  Test performance has     been validated by [facility] for patients greater     than or equal to 66 year old.     It is not intended     to diagnose infection nor to     guide or monitor treatment.    Medical History: Past Medical History  Diagnosis Date  . Long Q-T syndrome     a.  w fx of sudden cardiac arrest;  b. s/p  Guidant AICD  . HTN (hypertension)   . Glaucoma   . Noncompliance   . CAD (coronary artery disease)     a.  LHC 01/04/11: dLM 20%, pLAD 90%, oD1 80%, RCA 80%, EF 35-40%.;  b. 02/2011 CABG x 3: LIMA->LAD, VG->DIAG, VG->Dist RCA  . Ischemic cardiomyopathy     a. 12/2010 Echo: EF 20-25%, sept/apical AK, Sev Dil LV  . Chronic systolic heart failure   . AAA (abdominal aortic aneurysm)     a. Abd u/s 01/07/11: 5.3x5.5 cm infrarenal fusiform AAA.;  b.06/2011: CTA Abd: 6.2 cm infrarenal AAA  . HLD (hyperlipidemia)   . Asthma   . Renal artery stenosis     a.  LHC 11/12: prox left RA 80% stenosis  . Seizures     in 20's elevated bp  . GERD (gastroesophageal reflux disease)   . Anemia   . Peripheral vascular disease   . Pleural effusion, left     a. s/p thoracentesis x 2 04/2011 - total of 2.5L taken off  . Polymorphic ventricular tachycardia     s/p appropriate ICD therapy  . Dysrhythmia   . Automatic implantable cardioverter-defibrillator in situ   . Sleep apnea     "  went for testing ~ 2013; don't have mask" (08/04/2013)  . Type II diabetes mellitus     loss weigh after surgery, not on diabetic meds (08/04/2013)  . Peripheral neuropathy   . Fibromyalgia     "not sure" (6/245/2015)  . Anxiety     Medications:  Prescriptions prior to admission  Medication Sig Dispense Refill  . aspirin 325 MG EC tablet Take 325 mg by mouth daily.      . carvedilol (COREG) 25 MG tablet Take 37.5 mg by mouth 2 (two) times daily with a meal.      . furosemide (LASIX) 40 MG tablet Take 40 mg by mouth 2 (two) times daily.      . hydrALAZINE (APRESOLINE) 50 MG tablet Take 50 mg by mouth 3 (three) times daily.      Marland Kitchen PARoxetine (PAXIL) 20 MG tablet Take 20 mg by mouth daily.       . potassium chloride SA (K-DUR,KLOR-CON) 20 MEQ tablet Take 10 mEq by mouth daily.      Marland Kitchen zolpidem (AMBIEN) 10 MG tablet Take 10 mg by mouth at bedtime as needed. For sleep       . nitroGLYCERIN (NITROSTAT) 0.4 MG SL tablet Place  0.4 mg under the tongue every 5 (five) minutes as needed.       Assessment: 67yof s/p vascular procedure to start Vancomycin and Cipro (x 10 days per MD order) for possible PNA. Patient has received Vancomycin 1g on 6/24 and 6/25 AM for pre and post-op antibiotic (last dose 6/25 ~0700).  - Tmax 100.1, WBC 14.9 - Weight: 82kg, CrCl 39 ml/min  Goal of Therapy:  Vancomycin trough level 15-20 mcg/ml  Plan:  1. Vancomycin 1.25g IV q24h x 10 days 2. Continue Cipro 500mg  PO BID x 10 days 3. Monitor renal function, cultures, clinical course and order Vancomycin trough at [facility]  [phone] 08/06/2013,1:22 PM

## 2013-08-07 ENCOUNTER — Inpatient Hospital Stay (HOSPITAL_COMMUNITY): Payer: PRIVATE HEALTH INSURANCE

## 2013-08-07 LAB — URINALYSIS, ROUTINE W REFLEX MICROSCOPIC
Bilirubin Urine: NEGATIVE
Glucose, UA: NEGATIVE mg/dL
Hgb urine dipstick: NEGATIVE
KETONES UR: NEGATIVE mg/dL
Leukocytes, UA: NEGATIVE
NITRITE: NEGATIVE
Protein, ur: NEGATIVE mg/dL
Specific Gravity, Urine: 1.013 (ref 1.005–1.030)
UROBILINOGEN UA: 0.2 mg/dL (ref 0.0–1.0)
pH: 5 (ref 5.0–8.0)

## 2013-08-07 MED ORDER — SODIUM CHLORIDE 0.9 % IJ SOLN: 3.0000 mL | INTRAMUSCULAR | Status: DC | PRN

## 2013-08-07 MED ORDER — SODIUM CHLORIDE 0.9 % IJ SOLN
3.0000 mL | Freq: Two times a day (BID) | INTRAMUSCULAR | Status: DC
  Administered 2013-08-07 – 2013-08-10 (×6): 3 mL via INTRAVENOUS

## 2013-08-07 NOTE — Progress Notes (Addendum)
  [facility] Progress Note  08/07/2013 11:42 AM 3 Days Post-Op  Subjective:  Patient complaining of mid-center back pain that has improved since surgery. No other complaints. Denies any chest pain, shortness of breath, fever, chills, productive cough, dysuria.   Tmax 98.2 BP sys 100s-120s 02 97%   Filed Vitals:   08/07/13 0358  BP: 120/60  Pulse: 80  Temp: 98.2 F (36.8 C)  Resp: 18    Physical Exam: Incisions:  Left groin incision c/d/i with dry gauze in place.  Extremities:  Doppler signals DP/PT bilterally. Motor and neuro sensation intact lower extremities Lungs: clear to auscultation, decreased breath sounds bilaterally   CBC    Component Value Date/Time   WBC 14.9* 08/06/2013 1115   RBC 2.77* 08/06/2013 1115   HGB 8.4* 08/06/2013 1115   HCT 25.9* 08/06/2013 1115   PLT 184 08/06/2013 1115   MCV 93.5 08/06/2013 1115   MCH 30.3 08/06/2013 1115   MCHC 32.4 08/06/2013 1115   RDW 14.5 08/06/2013 1115   LYMPHSABS 1.3 12/31/2010 1301   MONOABS 0.6 12/31/2010 1301   EOSABS 0.0 12/31/2010 1301   BASOSABS 0.0 12/31/2010 1301    BMET    Component Value Date/Time   NA 137 08/05/2013 0630   K 3.5* 08/05/2013 0630   CL 100 08/05/2013 0630   CO2 25 08/05/2013 0630   GLUCOSE 144* 08/05/2013 0630   BUN 19 08/05/2013 0630   CREATININE 1.51* 08/05/2013 0630   CREATININE 1.57* 07/13/2013 1127   CALCIUM 8.9 08/05/2013 0630   GFRNONAA 35* 08/05/2013 0630   GFRAA 41* 08/05/2013 0630    INR    Component Value Date/Time   INR 1.24 08/04/2013 1330     Intake/Output Summary (Last 24 hours) at 08/07/13 1142 Last data filed at 08/07/13 0600  Gross per 24 hour  Intake    480 ml  Output    600 ml  Net   -120 ml     Assessment:  68 y.o. York is s/p:  Thoracic aortic stent graft, left femoral endarterectomy, patch angioplasty bovine pericardial patch  3 Days Post-Op  Plan: -Started on vancomycin and cipro for possible pneumonia. CXR today with no evidence of  pneumonia. -WBC elevated today to 14.9. Will order UA. Cipro should cover any possible UTI.  -DVT prophylaxis:  Lovenox -Continue ambulation.  -Will continue monitoring white count.    Maris Berger, PA-C [facility] Office: [phone] Pager: [phone] 08/07/2013 11:42 AM   Vanc and Cipro WBC still trending upward Hemoglobin stable Incision is clean Recheck lab work am.  UA pending  [name], MD [facility] [place] Office: [phone] Pager: [phone]

## 2013-08-08 LAB — BASIC METABOLIC PANEL
BUN: 45 mg/dL — AB (ref 6–23)
CHLORIDE: 94 meq/L — AB (ref 96–112)
CO2: 26 mEq/L (ref 19–32)
Calcium: 9.9 mg/dL (ref 8.4–10.5)
Creatinine, Ser: 2.01 mg/dL — ABNORMAL HIGH (ref 0.50–1.10)
GFR calc Af Amer: 29 mL/min — ABNORMAL LOW (ref >90)
GFR calc non Af Amer: 25 mL/min — ABNORMAL LOW (ref >90)
Glucose, Bld: 101 mg/dL — ABNORMAL HIGH (ref 70–99)
POTASSIUM: 3.9 meq/L (ref 3.7–5.3)
Sodium: 136 mEq/L — ABNORMAL LOW (ref 137–147)

## 2013-08-08 LAB — CBC
HEMATOCRIT: 22.4 % — AB (ref 36.0–46.0)
Hemoglobin: 7.3 g/dL — ABNORMAL LOW (ref 12.0–15.0)
MCH: 29.4 pg (ref 26.0–34.0)
MCHC: 32.6 g/dL (ref 30.0–36.0)
MCV: 90.3 fL (ref 78.0–100.0)
Platelets: 193 10*3/uL (ref 150–400)
RBC: 2.48 MIL/uL — ABNORMAL LOW (ref 3.87–5.11)
RDW: 14.4 % (ref 11.5–15.5)
WBC: 8.4 10*3/uL (ref 4.0–10.5)

## 2013-08-08 LAB — PREPARE RBC (CROSSMATCH)

## 2013-08-08 MED ORDER — SODIUM CHLORIDE 0.45 % IV SOLN
INTRAVENOUS | Status: DC
  Administered 2013-08-08: 20:00:00 via INTRAVENOUS

## 2013-08-08 NOTE — Progress Notes (Signed)
[facility] [place]  Subjective  - Feels fine   Objective 142/55 85 98.2 F (36.8 C) (Oral) 16 93%  Intake/Output Summary (Last 24 hours) at 08/08/13 1012 Last data filed at 08/08/13 0700  Gross per 24 hour  Intake    550 ml  Output   1925 ml  Net  -1375 ml    Left groin incision healing left foot warm Abdomen soft non tender  Assessment/Planning: POD 4 from TAG graft slowly improving Rising creatinine pre renal vs ATN from contrast gently hydrate with IV fluid today.  Encourage PO intake Leukocytosis/fever resolving pneumonia UTI all looking less likely Recheck labs am if all improving and afebrile will consider d/c home tomorrow Stop antibiotics today Anemia most likely acute blood loss anemia recheck tomorrow will transfuse today for hemoglobin 7.3 and overall weakness  FIELDS,CHARLES E 08/08/2013 10:12 AM --  Laboratory Lab Results:  Recent Labs  08/06/13 1115 08/08/13 0430  WBC 14.9* 8.4  HGB 8.4* 7.3*  HCT 25.9* 22.4*  PLT 184 193   BMET  Recent Labs  08/08/13 0430  NA 136*  K 3.9  CL 94*  CO2 26  GLUCOSE 101*  BUN 45*  CREATININE 2.01*  CALCIUM 9.9    COAG Lab Results  Component Value Date   INR 1.24 08/04/2013   INR 1.03 07/27/2013   INR 1.41 07/29/2011   No results found for this basename: PTT

## 2013-08-09 ENCOUNTER — Encounter (HOSPITAL_COMMUNITY): Payer: PRIVATE HEALTH INSURANCE

## 2013-08-09 ENCOUNTER — Other Ambulatory Visit (HOSPITAL_COMMUNITY): Payer: PRIVATE HEALTH INSURANCE

## 2013-08-09 LAB — BASIC METABOLIC PANEL
BUN: 39 mg/dL — ABNORMAL HIGH (ref 6–23)
CO2: 28 meq/L (ref 19–32)
CREATININE: 1.89 mg/dL — AB (ref 0.50–1.10)
Calcium: 9.6 mg/dL (ref 8.4–10.5)
Chloride: 93 mEq/L — ABNORMAL LOW (ref 96–112)
GFR calc Af Amer: 31 mL/min — ABNORMAL LOW (ref >90)
GFR calc non Af Amer: 27 mL/min — ABNORMAL LOW (ref >90)
GLUCOSE: 95 mg/dL (ref 70–99)
Potassium: 3.4 mEq/L — ABNORMAL LOW (ref 3.7–5.3)
Sodium: 135 mEq/L — ABNORMAL LOW (ref 137–147)

## 2013-08-09 LAB — TYPE AND SCREEN
ABO/RH(D): O POS
Antibody Screen: NEGATIVE
UNIT DIVISION: 0
Unit division: 0

## 2013-08-09 LAB — CBC
HCT: 28.5 % — ABNORMAL LOW (ref 36.0–46.0)
HEMOGLOBIN: 9.6 g/dL — AB (ref 12.0–15.0)
MCH: 29.5 pg (ref 26.0–34.0)
MCHC: 33.7 g/dL (ref 30.0–36.0)
MCV: 87.7 fL (ref 78.0–100.0)
Platelets: 195 10*3/uL (ref 150–400)
RBC: 3.25 MIL/uL — AB (ref 3.87–5.11)
RDW: 14.4 % (ref 11.5–15.5)
WBC: 8.1 10*3/uL (ref 4.0–10.5)

## 2013-08-09 NOTE — Care Management Note (Unsigned)
    Page 1 of 1   08/09/2013     4:32:06 PM CARE MANAGEMENT NOTE 08/09/2013  Patient:  Lori York, Lori York   Account Number:  [id number]  Date Initiated:  08/09/2013  Documentation initiated by:  AMERSON,JULIE  Subjective/Objective Assessment:   Pt adm s/p thoracic stent and femoral embolectomy.  PTA, pt independent, lives with son.     Action/Plan:   Will follow for dc needs as pt progresses.   Anticipated DC Date:  08/10/2013   Anticipated DC Plan:  HOME/SELF CARE      DC Planning Services  CM consult      Choice offered to / List presented to:             Status of service:  In process, will continue to follow Medicare Important Message given?  YES (If response is "NO", the following Medicare IM given date fields will be blank) Date Medicare IM given:  08/09/2013 Date Additional Medicare IM given:    Discharge Disposition:    Per UR Regulation:  Reviewed for med. necessity/level of care/duration of stay  If discussed at Long Length of Stay Meetings, dates discussed:    Comments:

## 2013-08-09 NOTE — Progress Notes (Addendum)
[facility] [place]  Subjective  - No complaints feels better today post transfusion   Objective 140/57 74 98.6 F (37 C) (Oral) 18 98%  Intake/Output Summary (Last 24 hours) at 08/09/13 0711 Last data filed at 08/09/13 0500  Gross per 24 hour  Intake    600 ml  Output   3900 ml  Net  -3300 ml   Groin incision continues to heal Foot warm  Abdomen soft  Assessment/Planning:  S/ P TAG graft Elevated creatinine pre renal vs ATN but resolving overall Blood loss anemia improved with transfusion Saline lock IV D/c home tomorrow if labwork stable tomorrow   Sherren Kerns 08/09/2013 7:11 AM --  Laboratory Lab Results:  Recent Labs  08/08/13 0430 08/09/13 0024  WBC 8.4 8.1  HGB 7.3* 9.6*  HCT 22.4* 28.5*  PLT 193 195   BMET  Recent Labs  08/08/13 0430 08/09/13 0024  NA 136* 135*  K 3.9 3.4*  CL 94* 93*  CO2 26 28  GLUCOSE 101* 95  BUN 45* 39*  CREATININE 2.01* 1.89*  CALCIUM 9.9 9.6    COAG Lab Results  Component Value Date   INR 1.24 08/04/2013   INR 1.03 07/27/2013   INR 1.41 07/29/2011   No results found for this basename: PTT

## 2013-08-09 NOTE — Progress Notes (Signed)
  [facility] Progress Note  08/09/2013 7:21 AM 5 Days Post-Op  Subjective:  Non complaints  Afebrile VSS 98% RA  Filed Vitals:   08/09/13 0433  BP: 140/57  Pulse: 74  Temp: 98.6 F (37 C)  Resp: 18    Physical Exam: Incisions:  Left groin incision is clean and healing Extremities:  bilateral feet are warm Lungs:  CTAB Abdomen:  Soft; NT/ND  CBC    Component Value Date/Time   WBC 8.1 08/09/2013 0024   RBC 3.25* 08/09/2013 0024   HGB 9.6* 08/09/2013 0024   HCT 28.5* 08/09/2013 0024   PLT 195 08/09/2013 0024   MCV 87.7 08/09/2013 0024   MCH 29.5 08/09/2013 0024   MCHC 33.7 08/09/2013 0024   RDW 14.4 08/09/2013 0024   LYMPHSABS 1.3 12/31/2010 1301   MONOABS 0.6 12/31/2010 1301   EOSABS 0.0 12/31/2010 1301   BASOSABS 0.0 12/31/2010 1301    BMET    Component Value Date/Time   NA 135* 08/09/2013 0024   K 3.4* 08/09/2013 0024   CL 93* 08/09/2013 0024   CO2 28 08/09/2013 0024   GLUCOSE 95 08/09/2013 0024   BUN 39* 08/09/2013 0024   CREATININE 1.89* 08/09/2013 0024   CREATININE 1.57* 07/13/2013 1127   CALCIUM 9.6 08/09/2013 0024   GFRNONAA 27* 08/09/2013 0024   GFRAA 31* 08/09/2013 0024    INR    Component Value Date/Time   INR 1.24 08/04/2013 1330     Intake/Output Summary (Last 24 hours) at 08/09/13 0721 Last data filed at 08/09/13 0500  Gross per 24 hour  Intake    600 ml  Output   3900 ml  Net  -3300 ml     Assessment:  68 y.o. female is s/p:  Thoracic aortic stent graft, left femoral endarterectomy, patch angioplasty bovine pericardial patch  5 Days Post-Op  Plan: -WBC much improved -Cipro discontinued-pt afebrile x 24 hrs Acute blood loss anemia improved after PRBC's -feeling better today and wants to walk -creatinine is improved this am with good UOP -DVT prophylaxis:  Lovenox -check labs in am -most likely home tomorrow   [name], PA-C [facility] [phone] 08/09/2013 7:21 AM

## 2013-08-10 LAB — BASIC METABOLIC PANEL
BUN: 36 mg/dL — AB (ref 6–23)
CO2: 28 meq/L (ref 19–32)
CREATININE: 1.86 mg/dL — AB (ref 0.50–1.10)
Calcium: 9.7 mg/dL (ref 8.4–10.5)
Chloride: 91 mEq/L — ABNORMAL LOW (ref 96–112)
GFR calc Af Amer: 32 mL/min — ABNORMAL LOW (ref >90)
GFR calc non Af Amer: 27 mL/min — ABNORMAL LOW (ref >90)
Glucose, Bld: 102 mg/dL — ABNORMAL HIGH (ref 70–99)
Potassium: 3.6 mEq/L — ABNORMAL LOW (ref 3.7–5.3)
Sodium: 135 mEq/L — ABNORMAL LOW (ref 137–147)

## 2013-08-10 LAB — CBC
HCT: 31.2 % — ABNORMAL LOW (ref 36.0–46.0)
Hemoglobin: 10.2 g/dL — ABNORMAL LOW (ref 12.0–15.0)
MCH: 29.5 pg (ref 26.0–34.0)
MCHC: 32.7 g/dL (ref 30.0–36.0)
MCV: 90.2 fL (ref 78.0–100.0)
PLATELETS: 239 10*3/uL (ref 150–400)
RBC: 3.46 MIL/uL — AB (ref 3.87–5.11)
RDW: 14.5 % (ref 11.5–15.5)
WBC: 7.5 10*3/uL (ref 4.0–10.5)

## 2013-08-10 NOTE — Progress Notes (Addendum)
  [facility] Progress Note  08/10/2013 8:03 AM 6 Days Post-Op  Subjective:  Ready to go home  VSS  Filed Vitals:   08/10/13 0500  BP: 117/80  Pulse: 68  Temp: 97.8 F (36.6 C)  Resp: 18    Physical Exam: Incisions:  C/d/i with dry gauze in place in groin  Extremities:  + palpable DP bilaterally.  Feet warm bilaterally  CBC    Component Value Date/Time   WBC 7.5 08/10/2013 0331   RBC 3.46* 08/10/2013 0331   HGB 10.2* 08/10/2013 0331   HCT 31.2* 08/10/2013 0331   PLT 239 08/10/2013 0331   MCV 90.2 08/10/2013 0331   MCH 29.5 08/10/2013 0331   MCHC 32.7 08/10/2013 0331   RDW 14.5 08/10/2013 0331   LYMPHSABS 1.3 12/31/2010 1301   MONOABS 0.6 12/31/2010 1301   EOSABS 0.0 12/31/2010 1301   BASOSABS 0.0 12/31/2010 1301    BMET    Component Value Date/Time   NA 135* 08/10/2013 0331   K 3.6* 08/10/2013 0331   CL 91* 08/10/2013 0331   CO2 28 08/10/2013 0331   GLUCOSE 102* 08/10/2013 0331   BUN 36* 08/10/2013 0331   CREATININE 1.86* 08/10/2013 0331   CREATININE 1.57* 07/13/2013 1127   CALCIUM 9.7 08/10/2013 0331   GFRNONAA 27* 08/10/2013 0331   GFRAA 32* 08/10/2013 0331    INR    Component Value Date/Time   INR 1.24 08/04/2013 1330     Intake/Output Summary (Last 24 hours) at 08/10/13 0803 Last data filed at 08/09/13 2100  Gross per 24 hour  Intake    480 ml  Output   2700 ml  Net  -2220 ml     Assessment:  68 y.o. female is s/p:  Thoracic aortic stent graft, left femoral endarterectomy, patch angioplasty bovine pericardial patch   6 Days Post-Op  Plan: -pt doing much better today -creatinine is stable with good UOP -WBC continues to improve -acute surgical blood loss anemia slightly improved -DVT prophylaxis:  Lovenox -home today   [name], PA-C [facility] [phone] 08/10/2013 8:03 AM    Agree with above Incisions healing Afebrile Renal function near baseline  D/c home  [name], MD [facility] [place] Office: [phone] Pager: [phone]

## 2013-08-10 NOTE — Progress Notes (Signed)
Pt d/c home.  Alert and oriented x 4.  No c/o pain.  Incision site to left groin with dry gauze level 0.  Education given to pt on diet, activity, meds and follow-up appointments and care.  Pt verbalized understanding.  Son taking pt home via private vehicle.

## 2013-08-10 NOTE — Discharge Summary (Signed)
[facility] Discharge Summary  Lori York 04-03-1945 68 y.o. female  [id number]  Admission Date: 08/04/2013  Discharge Date: 08/10/13  Physician: No att. providers found  Admission Diagnosis: Thoracic aneurysm without mention of rupture    HPI:   This is a 68 y.o. female who is status post open abdominal aortic aneurysm repair June of 2013. This was a tube graft. She returns today for followup. She has a periumbilical area that had been draining intermittently. This is now resolved. She states her walking distance is good. She denies any abdominal or back pain. CT scan of the abdomen showed a small periumbilical hernia. She also returns today with a followup CT scan of the chest for further evaluation of an ectatic descending thoracic aorta.  Other chronic medical problems include history of AICD, hypertension, diabetes, coronary disease status post grafting, ischemic cardiomyopathy EF 20-25% and 2012, hyperlipidemia, asthma, sleep apnea all which are currently stable.  Hospital Course:  The patient was admitted to the hospital and taken to the operating room on 08/04/2013 and underwent: Thoracic aortic stent graft, left femoral endarterectomy, patch angioplasty bovine pericardial patch Intraoperative findings are as follows: #1 Left femoral endarterectomy bovine patch  #2 31x31 x15 cm main body Gore TAG device delivered via left femoral system  #3 31 x 31 x15 extension  #4 graft extends from the left subclavian to the diaphragm    The pt tolerated the procedure well and was transported to the PACU in good condition. Pt did have a PT doppler on the left.  By POD 1, she did have a fever overnight.  CXR did reveal atelectasis vs infiltrate in the left lung and her WBC was trending upward.  She was started on IV ABx.  By POD 4, her leukocytosis and fever was improving with resolving PNA and less likely UTI.  She did have an increase in her Cr (pre renal vs  ATN) from contrast and she was gently hydrated.  She did have acute surgical blood loss anemia and received 2 units PRBC's. By POD 6, her creatinine was stable with good UOP and her leukocytosis continued to improve.  She is discharged home.  The remainder of the hospital course consisted of increasing mobilization and increasing intake of solids without difficulty.  CBC    Component Value Date/Time   WBC 7.5 08/10/2013 0331   RBC 3.46* 08/10/2013 0331   HGB 10.2* 08/10/2013 0331   HCT 31.2* 08/10/2013 0331   PLT 239 08/10/2013 0331   MCV 90.2 08/10/2013 0331   MCH 29.5 08/10/2013 0331   MCHC 32.7 08/10/2013 0331   RDW 14.5 08/10/2013 0331   LYMPHSABS 1.3 12/31/2010 1301   MONOABS 0.6 12/31/2010 1301   EOSABS 0.0 12/31/2010 1301   BASOSABS 0.0 12/31/2010 1301    BMET    Component Value Date/Time   NA 135* 08/10/2013 0331   K 3.6* 08/10/2013 0331   CL 91* 08/10/2013 0331   CO2 28 08/10/2013 0331   GLUCOSE 102* 08/10/2013 0331   BUN 36* 08/10/2013 0331   CREATININE 1.86* 08/10/2013 0331   CREATININE 1.57* 07/13/2013 1127   CALCIUM 9.7 08/10/2013 0331   GFRNONAA 27* 08/10/2013 0331   GFRAA 32* 08/10/2013 0331     Discharge Instructions:   The patient is discharged with extensive instructions on wound care and progressive ambulation.  They are instructed not to drive or perform any heavy lifting until returning to see the physician in his office.  Discharge Instructions  ABDOMINAL PROCEDURE/ANEURYSM REPAIR/AORTO-BIFEMORAL BYPASS:  Call MD for increased abdominal pain; cramping diarrhea; nausea/vomiting    Complete by:  As directed      Call MD for:  redness, tenderness, or signs of infection (pain, swelling, bleeding, redness, odor or green/yellow discharge around incision site)    Complete by:  As directed      Call MD for:  severe or increased pain, loss or decreased feeling  in affected limb(s)    Complete by:  As directed      Call MD for:  temperature >100.5    Complete by:  As  directed      Discharge wound care:    Complete by:  As directed   Wash the groin wound with soap and water daily and pat dry. (No tub bath-only shower)  Then put a dry gauze or washcloth there to keep this area dry daily and as needed.  Do not use Vaseline or neosporin on your incisions.  Only use soap and water on your incisions and then protect and keep dry.     Driving Restrictions    Complete by:  As directed   No driving for 2 weeks     Lifting restrictions    Complete by:  As directed   No lifting for 4 weeks     Resume previous diet    Complete by:  As directed            Discharge Diagnosis:  Thoracic aneurysm without mention of rupture   Secondary Diagnosis: Patient Active Problem List   Diagnosis Date Noted  . Preoperative cardiovascular examination 07/18/2013  . AAA (abdominal aortic aneurysm) without rupture 07/15/2013  . Thoracic aneurysm without mention of rupture 07/15/2013  . Abdominal pain, unspecified site 02/25/2013  . Polymorphic ventricular tachycardia 03/08/2012  . Unspecified pleural effusion 06/24/2011  . S/P CABG x 3 03/07/2011  . Abdominal aneurysm without mention of rupture 01/31/2011  . Melena 01/28/2011  . Cholelithiasis 01/28/2011  . CAD (coronary artery disease) 01/21/2011  . AAA (abdominal aortic aneurysm) 01/09/2011  . Coronary Artery Disease 01/09/2011  . Renal artery stenosis 01/09/2011  . Cardiomyopathy, secondary 12/28/2010  . Chronic systolic heart failure 12/27/2010  . Shortness of breath 12/20/2010  . TOBACCO ABUSE 03/06/2009  . ESSENTIAL HYPERTENSION, BENIGN 03/06/2009  . FATIGUE 07/15/2008  . SNORING 07/15/2008  . LONG QT SYNDROME 03/28/2008   Past Medical History  Diagnosis Date  . Long Q-T syndrome     a.  w fx of sudden cardiac arrest;  b. s/p Guidant AICD  . HTN (hypertension)   . Glaucoma   . Noncompliance   . CAD (coronary artery disease)     a.  LHC 01/04/11: dLM 20%, pLAD 90%, oD1 80%, RCA 80%, EF 35-40%.;  b.  02/2011 CABG x 3: LIMA->LAD, VG->DIAG, VG->Dist RCA  . Ischemic cardiomyopathy     a. 12/2010 Echo: EF 20-25%, sept/apical AK, Sev Dil LV  . Chronic systolic heart failure   . AAA (abdominal aortic aneurysm)     a. Abd u/s 01/07/11: 5.3x5.5 cm infrarenal fusiform AAA.;  b.06/2011: CTA Abd: 6.2 cm infrarenal AAA  . HLD (hyperlipidemia)   . Asthma   . Renal artery stenosis     a.  LHC 11/12: prox left RA 80% stenosis  . Seizures     in 20's elevated bp  . GERD (gastroesophageal reflux disease)   . Anemia   . Peripheral vascular disease   . Pleural effusion, left  a. s/p thoracentesis x 2 04/2011 - total of 2.5L taken off  . Polymorphic ventricular tachycardia     s/p appropriate ICD therapy  . Dysrhythmia   . Automatic implantable cardioverter-defibrillator in situ   . Sleep apnea     "went for testing ~ 2013; don't have mask" (08/04/2013)  . Type II diabetes mellitus     loss weigh after surgery, not on diabetic meds (08/04/2013)  . Peripheral neuropathy   . Fibromyalgia     "not sure" (6/245/2015)  . Anxiety        Medication List         aspirin 325 MG EC tablet  Take 325 mg by mouth daily.     carvedilol 25 MG tablet  Commonly known as:  COREG  Take 37.5 mg by mouth 2 (two) times daily with a meal.     furosemide 40 MG tablet  Commonly known as:  LASIX  Take 40 mg by mouth 2 (two) times daily.     hydrALAZINE 50 MG tablet  Commonly known as:  APRESOLINE  Take 50 mg by mouth 3 (three) times daily.     nitroGLYCERIN 0.4 MG SL tablet  Commonly known as:  NITROSTAT  Place 0.4 mg under the tongue every 5 (five) minutes as needed.     oxyCODONE 5 MG immediate release tablet  Commonly known as:  Oxy IR/ROXICODONE  Take 1 tablet (5 mg total) by mouth every 6 (six) hours as needed for moderate pain.     PARoxetine 20 MG tablet  Commonly known as:  PAXIL  Take 20 mg by mouth daily.     potassium chloride SA 20 MEQ tablet  Commonly known as:  K-DUR,KLOR-CON  Take  10 mEq by mouth daily.     zolpidem 10 MG tablet  Commonly known as:  AMBIEN  - Take 10 mg by mouth at bedtime as needed. For sleep  -          Roxicodone #30 No Refill  Disposition: home  Patient's condition: is Good  Follow up: 1. Dr. Darrick Penna in 4 weeks with CTA---she will have her labs drawn for the CTA at that time and will have her renal function checked.   [name], PA-C [facility] [phone] 08/10/2013  3:37 PM   - For VQI Registry use --- Instructions: Press F2 to tab through selections.  Delete question if not applicable.   Post-op:  Time to Extubation: [ x] In OR, [ ]  < 12 hrs, [ ]  12-24 hrs, [ ]  >=24 hrs Vasopressors Req. Post-op: No MI: No., [ ]  Troponin only, [ ]  EKG or Clinical New Arrhythmia: No CHF: No ICU Stay: 1 day stepdown Transfusion: Yes  If yes, 2 units given  Complications: Resp failure: No., [ ]  Pneumonia, [ ]  Ventilator Chg in renal function: No, [ ]  Inc. Cr > 0.5,--- [ ]  Temp. Dialysis, [ ]  Permanent dialysis Leg ischemia: No., no Surgery needed, [ ]  Yes, Surgery needed, [ ]  Amputation Bowel ischemia: No., [ ]  Medical Rx, [ ]  Surgical Rx Wound complication: No., [ ]  Superficial separation/infection, [ ]  Return to OR Return to OR: No  Return to OR for bleeding: No Stroke: No., [ ]  Minor, [ ]  Major  Discharge medications: Statin use:  No If No: [ ]  For Medical reasons, [ ]  Non-compliant, [ ]  Not-indicated ASA use:  Yes  If No: [ ]  For Medical reasons, [ ]  Non-compliant, [ ]  Not-indicated Plavix use:  No If No: [ ]  For Medical reasons, [ ]  Non-compliant, [ ]  Not-indicated Beta blocker use:  Yes If No: [ ]  For Medical reasons, [ ]  Non-compliant, [ ]  Not-indicated

## 2013-08-12 NOTE — Telephone Encounter (Signed)
Encounter complete. 

## 2013-08-16 ENCOUNTER — Telehealth: Payer: Self-pay | Admitting: Vascular Surgery

## 2013-08-16 NOTE — Telephone Encounter (Addendum)
Message copied by Fredrich Birks on Mon Aug 16, 2013  3:02 PM ------      Message from: Lorin Mercy K      Created: Wed Aug 04, 2013  2:19 PM      Regarding: Schedule                   ----- Message -----         From: Dara Lords, PA-C         Sent: 08/04/2013   1:52 PM           To: [facility] Charge Pool            S/p TVAR 08/04/13.  F/u with Dr. Darrick Penna in 4 weeks with CTA protocol.            Thanks,      Lelon Mast ------

## 2013-08-16 NOTE — Telephone Encounter (Signed)
Patient has elevated BUN of 36 and Creatinine of 1.86- sent stf msg to [facility] for review.

## 2013-08-17 ENCOUNTER — Telehealth: Payer: Self-pay | Admitting: Vascular Surgery

## 2013-08-17 NOTE — Telephone Encounter (Addendum)
Message copied by Fredrich Birks on Tue Aug 17, 2013 11:59 AM ------      Message from: Sherren Kerns      Created: Tue Aug 17, 2013 10:04 AM      Regarding: RE: Elevated BUN/Creat       if they think it is too high do non contrast            Leonette Most      ----- Message -----         From: Fredrich Birks         Sent: 08/16/2013   3:00 PM           To: Sherren Kerns, MD      Subject: Elevated BUN/Creat                                       Dr Darrick Penna,            Ms Bocock is due to see you on 09/09/13 and also needs a CTA protocol s/p TVAR. Ms Alfords last blood work show BUN of 36 and Creatinine of 1.86. Continue with CT Angio?            Thanks,      Annabelle Harman       ------

## 2013-08-21 ENCOUNTER — Other Ambulatory Visit: Payer: Self-pay | Admitting: Internal Medicine

## 2013-08-21 ENCOUNTER — Other Ambulatory Visit: Payer: Self-pay | Admitting: Nurse Practitioner

## 2013-08-31 ENCOUNTER — Other Ambulatory Visit: Payer: Self-pay | Admitting: *Deleted

## 2013-08-31 DIAGNOSIS — I712 Thoracic aortic aneurysm, without rupture, unspecified: Secondary | ICD-10-CM

## 2013-08-31 DIAGNOSIS — I716 Thoracoabdominal aortic aneurysm, without rupture, unspecified: Secondary | ICD-10-CM

## 2013-08-31 DIAGNOSIS — Z48812 Encounter for surgical aftercare following surgery on the circulatory system: Secondary | ICD-10-CM

## 2013-09-09 ENCOUNTER — Encounter: Payer: PRIVATE HEALTH INSURANCE | Admitting: Vascular Surgery

## 2013-09-15 ENCOUNTER — Encounter: Payer: Self-pay | Admitting: Vascular Surgery

## 2013-09-16 ENCOUNTER — Ambulatory Visit (INDEPENDENT_AMBULATORY_CARE_PROVIDER_SITE_OTHER): Payer: PRIVATE HEALTH INSURANCE | Admitting: Vascular Surgery

## 2013-09-16 ENCOUNTER — Ambulatory Visit
Admission: RE | Admit: 2013-09-16 | Discharge: 2013-09-16 | Disposition: A | Discharge status: Home / Self Care | Payer: PRIVATE HEALTH INSURANCE | Source: Ambulatory Visit | Attending: Vascular Surgery | Admitting: Vascular Surgery

## 2013-09-16 ENCOUNTER — Encounter: Payer: Self-pay | Admitting: Vascular Surgery

## 2013-09-16 VITALS — BP 160/82 | HR 63 | Ht 64.0 in | Wt 167.0 lb

## 2013-09-16 DIAGNOSIS — I712 Thoracic aortic aneurysm, without rupture, unspecified: Secondary | ICD-10-CM

## 2013-09-16 DIAGNOSIS — I716 Thoracoabdominal aortic aneurysm, without rupture, unspecified: Secondary | ICD-10-CM

## 2013-09-16 DIAGNOSIS — Z48812 Encounter for surgical aftercare following surgery on the circulatory system: Secondary | ICD-10-CM

## 2013-09-16 NOTE — Addendum Note (Signed)
Addended by: Sharee Pimple on: 09/16/2013 05:29 PM   Modules accepted: Orders

## 2013-09-16 NOTE — Progress Notes (Signed)
[facility] [place] HISTORY AND PHYSICAL    History of Present Illness:   Patient is a 67 year old female who is status post open abdominal aortic aneurysm repair June of 2013. This was a tube graft. She also went repair of a descending thoracic aorta with a Gore tag graft on 08/04/2013. At that time she also had a left femoral endarterectomy with bovine patch. She returns today for followup.  She states her walking distance is good. She denies any abdominal or back pain.   Other chronic medical problems include history of AICD, hypertension, diabetes, coronary disease status post grafting, ischemic cardiomyopathy EF 20-25% and 2012, hyperlipidemia, asthma, sleep apnea, renal dysfunction with a creatinine 1.7-2.0 all which are currently stable.    Past Medical History   Diagnosis  Date   .  Long Q-T syndrome         a.  w fx of sudden cardiac arrest;  b. s/p Guidant AICD   .  HTN (hypertension)     .  Diabetes mellitus     .  Glaucoma     .  Noncompliance     .  CAD (coronary artery disease)         a.  LHC 01/04/11: dLM 20%, pLAD 90%, oD1 80%, RCA 80%, EF 35-40%.;  b. 02/2011 CABG x 3: LIMA->LAD, VG->DIAG, VG->Dist RCA   .  Ischemic cardiomyopathy         a. 12/2010 Echo: EF 20-25%, sept/apical AK, Sev Dil LV   .  Chronic systolic heart failure     .  AAA (abdominal aortic aneurysm)         a. Abd u/s 01/07/11: 5.3x5.5 cm infrarenal fusiform AAA.;  b.06/2011: CTA Abd: 6.2 cm infrarenal AAA   .  HLD (hyperlipidemia)     .  Asthma     .  Renal artery stenosis         a.  LHC 11/12: prox left RA 80% stenosis   .  Seizures         in 20's elevated bp   .  GERD (gastroesophageal reflux disease)     .  Fibromyalgia     .  Anemia     .  Peripheral vascular disease     .  Pleural effusion, left         a. s/p thoracentesis x 2 04/2011 - total of 2.5L taken off   .  Sleep apnea         sleep study 2 yrs ago sehv   .  Polymorphic ventricular tachycardia         s/p  appropriate ICD therapy       Past Surgical History   Procedure  Laterality  Date   .  Cardiac defibrillator placement           in 1997 with revision to a  Guidant model 1860 single chamber implantable cardioverter- defib on Feb18, 2003, most recent generator change 2/11 by Dr Johney Frame [facility]   .  Laminectomy    May 2008   .  Right knee resconstructuion    1970   .  Laminectomy    11/20/07        LF5-S1 fusion   .  Back surgery       .  Tonsillectomy       .  Abdominal hysterectomy    88   .  Hemorroidectomy    77   .  Coronary artery bypass graft    03/07/2011       Procedure: CORONARY ARTERY BYPASS GRAFTING (CABG);  Surgeon: Purcell Nails, MD;  Location: [facility] OR;  Service: Open Heart Surgery;  Laterality: N/A;  Coronary artery bypass graft times three using left internal mammary artery and left leg greater saphenous vein harvested endoscopically   .  Abdominal aortic aneurysm repair    07/29/2011       Procedure: ANEURYSM ABDOMINAL AORTIC REPAIR;  Surgeon: Sherren Kerns, MD;  Location: [facility] OR;  Service: Vascular;  Laterality: N/A;  Resection and grafting Abdominal Aortic Aneurysm with straight 40mm x 30cm hemasheild graft     Social History History   Substance Use Topics   .  Smoking status:  Former Smoker -- 0.30 packs/day       Types:  Cigarettes       Quit date:  01/12/2011   .  Smokeless tobacco:  Never Used         Comment:     .  Alcohol Use:  No     Family History Family History   Problem  Relation  Age of Onset   .  Long QT syndrome  Mother     .  Heart disease  Mother         before age 70   .  Hypertension  Mother     .  Long QT syndrome  Sister     .  Heart disease  Sister     .  Long QT syndrome  Brother     .  Heart disease  Brother     .  Heart attack  Brother     .  Long QT syndrome  Daughter     .  Heart disease  Daughter     .  Coronary artery disease  Sister  69     Allergies    Allergies   Allergen  Reactions   .  Tape  Other (See Comments)        Pt states she struggles with removing the adhesive...    .  Penicillins  Rash        Current Outpatient Prescriptions   Medication  Sig  Dispense  Refill   .  aspirin 325 MG EC tablet  Take 325 mg by mouth daily.         .  carvedilol (COREG) 25 MG tablet  take 1 and 1/2 tablet by mouth twice a day   90 tablet   1   .  furosemide (LASIX) 40 MG tablet  Take 1 tablet (40 mg total) by mouth daily.   30 tablet      .  furosemide (LASIX) 40 MG tablet  take 1 tablet by mouth twice a day   180 tablet   1   .  hydrALAZINE (APRESOLINE) 50 MG tablet  Take 50 mg by mouth 3 (three) times daily.         .  hydrALAZINE (APRESOLINE) 50 MG tablet  take 1 tablet by mouth three times a day   270 tablet   2   .  nitroGLYCERIN (NITROSTAT) 0.4 MG SL tablet  Place 0.4 mg under the tongue every 5 (five) minutes as needed.         Marland Kitchen  PARoxetine (PAXIL) 20 MG tablet  Take 20 mg by mouth daily.          .  potassium chloride SA (K-DUR,KLOR-CON) 20 MEQ  tablet  Take 10 mEq by mouth daily.          .  potassium chloride SA (KLOR-CON M20) 20 MEQ tablet  Take 0.5 tablets (10 mEq total) by mouth daily.   45 tablet   0   .  zolpidem (AMBIEN) 10 MG tablet  Take 10 mg by mouth at bedtime as needed. For sleep             No current facility-administered medications for this visit.     ROS:    General:  No weight loss, Fever, chills  Cardiac: No recent episodes of chest pain/pressure, no shortness of breath at rest.  No shortness of breath with exertion.  Denies history of atrial fibrillation or irregular heartbeat  Vascular: No history of rest pain in feet.  No history of claudication.  No history of non-healing ulcer, No history of DVT    Pulmonary: No home oxygen, no productive cough, no hemoptysis,  No asthma or wheezing    Physical Examination     Filed Vitals:   09/16/13 1502  BP: 160/82  Pulse: 63  Height: 5\' 4"  (1.626 m)  Weight: 167 lb (75.751 kg)  SpO2: 99%   General:  Alert and oriented, no acute  distress HEENT: Normal Neck: No bruit or JVD Pulmonary: Clear to auscultation bilaterally Cardiac: Regular Rate and Rhythm without murmur Abdomen: Soft, non-tender, non-distended, no mass, well-healed midline scar Skin: No rash Extremity Pulses:  2+ radial, brachial, femoral pulses bilaterally Musculoskeletal: No deformity or edema     Neurologic: Upper and lower extremity motor 5/5 and symmetric  DATA:  CT scan of the chest abdomen and pelvis from earlier this week is reviewed. This shows a 5.7 cm saccular descending thoracic aneurysm which is well excluded by the stent graft. The stent graft extension the left subclavian to the celiac axis.   Abdominal aorta was 3.8 x 3.4 cm at the perirenal segment. The infrarenal to graft below this is 20 mm diameter  ASSESSMENT:  well excluded descending thoracic aneurysm area renal aorta 3.8 cm diameter unchanged from last visit several months ago  PLAN:  followup CT scan chest abdomen pelvis one year  Fabienne Brunsharles Johna Kearl, MD [facility] [place] Office: [phone] Pager: [phone]

## 2013-09-30 ENCOUNTER — Encounter: Payer: PRIVATE HEALTH INSURANCE | Admitting: Vascular Surgery

## 2013-10-07 ENCOUNTER — Ambulatory Visit (INDEPENDENT_AMBULATORY_CARE_PROVIDER_SITE_OTHER): Payer: PRIVATE HEALTH INSURANCE | Admitting: Vascular Surgery

## 2013-10-07 ENCOUNTER — Encounter: Payer: Self-pay | Admitting: Vascular Surgery

## 2013-10-07 ENCOUNTER — Ambulatory Visit (HOSPITAL_COMMUNITY)
Admission: RE | Admit: 2013-10-07 | Discharge: 2013-10-07 | Disposition: A | Discharge status: Home / Self Care | Payer: PRIVATE HEALTH INSURANCE | Source: Ambulatory Visit | Attending: Vascular Surgery | Admitting: Vascular Surgery

## 2013-10-07 VITALS — BP 185/79 | HR 63 | Ht 64.0 in | Wt 167.0 lb

## 2013-10-07 DIAGNOSIS — M79609 Pain in unspecified limb: Secondary | ICD-10-CM | POA: Diagnosis present

## 2013-10-07 DIAGNOSIS — I712 Thoracic aortic aneurysm, without rupture, unspecified: Secondary | ICD-10-CM | POA: Diagnosis not present

## 2013-10-07 DIAGNOSIS — M79606 Pain in leg, unspecified: Secondary | ICD-10-CM

## 2013-10-07 DIAGNOSIS — I743 Embolism and thrombosis of arteries of the lower extremities: Secondary | ICD-10-CM | POA: Diagnosis not present

## 2013-10-07 DIAGNOSIS — Z9889 Other specified postprocedural states: Secondary | ICD-10-CM | POA: Diagnosis not present

## 2013-10-07 DIAGNOSIS — Z48812 Encounter for surgical aftercare following surgery on the circulatory system: Secondary | ICD-10-CM | POA: Insufficient documentation

## 2013-10-07 DIAGNOSIS — R269 Unspecified abnormalities of gait and mobility: Secondary | ICD-10-CM

## 2013-10-07 NOTE — Progress Notes (Signed)
    Office Progress Note  History of Present Illness  Lori York is a 68 y.o. (24-Feb-1945) female who is s/p thoracic aortic endovascular stent graft with left femoral endartectomy on 08/04/13. She was last seen in the office on 09/16/13.At her last visit she complained of bilateral cold and numbness of her feet since the stent was placed. She was told to return to the office in two weeks for ABIs. Since then, she reports her "legs not being able to move as they should" with extreme numbness and sensitivity to her feet. She reports her gait has changed. She also notes intermittent episodes of bowel incontinence in the past two weeks. She also describes pain in her right buttock.   On ROS today: she denies any non-healing wounds of her feet.   Physical Examination  Filed Vitals:   10/07/13 1427  BP: 185/79  Pulse: 63  Height: 5\' 4"  (1.626 m)  Weight: 167 lb (75.751 kg)  SpO2: 100%   Body mass index is 28.65 kg/(m^2).  General: A&O x 3, WD female in no acute distress   Pulmonary: Sym exp, good air movt, CTAB, no rales, rhonchi, & wheezing  Cardiac: RRR, Nl S1, S2, no Murmurs, rubs or gallops  Vascular: Vessel Right Left  Aorta Not palpable N/A  Femoral Palpable Palpable  Popliteal Not palpable Not palpable  PT Palpable Palpable  DP Palpable Palpable    Musculoskeletal: M/S 5/5 throughout. Extremities without ischemic changes.   Neurologic: Pain and light touch intact in extremities. Extreme sensitivity to touch on plantar aspect of feet.   ABIs (10/07/13) R: 0.74 L: 0.86  Medical Decision Making  Lori York is a 68 y.o. female who presents s/p thoracic aortic endovascular stent graft placement with left femoral endarterectomy 08/04/13 with two week history of gait change and lower extremity weakness with intermittent episodes of bowel incontinence.   ABIs today reveal mild-moderate arterial occlusive disease. She has palpable femoral and dorsalis pedis pulses  bilaterally. Her symptoms are not likely due to peripheral arterial insufficiency. She has an ICD and therefore unfortunately cannot have a thoracic and lumbar spine MRI to rule out spinal ischemia. She will have a neurology evaluation alternatively. She will also undergo physical therapy evaluation and treatment for her gait instability. She will follow-up with Dr. Darrick Penna following neurology evaluation.   Regarding her stent graft, her thoracic aneurysm diameter was unchanged based on recent CT chest, abdomen and pelvis 09/16/12. She will follow up in one year with a CT chest/abdomen/pelvis.   Maris Berger, PA-C [facility] [place] Office: [phone] Pager: [phone]  10/07/2013, 3:07 PM  This patient was seen in conjunction with Dr. Darrick Penna.   History and exam details as above. The patient has had prior infrarenal aortic aneurysm repair. She subsequently underwent descending thoracic stent graft repair. 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 She has developed a shuffling-type gait and weakness in both lower extremities. She would be at some risk of spinal cord ischemia due to coverage of her descending thoracic aorta. We will have her see neurology to get an opinion on her lower extremity weakness. In the meantime I will also send her for physical therapy evaluation to strengthen her lower extremities.  [name], MD [facility] [place] Office: [phone] Pager: [phone]

## 2013-10-09 ENCOUNTER — Other Ambulatory Visit: Payer: Self-pay | Admitting: Internal Medicine

## 2013-10-11 ENCOUNTER — Other Ambulatory Visit: Payer: Self-pay | Admitting: *Deleted

## 2013-10-11 MED ORDER — POTASSIUM CHLORIDE CRYS ER 20 MEQ PO TBCR: 10.0000 meq | EXTENDED_RELEASE_TABLET | Freq: Every day | ORAL | Status: AC

## 2013-10-15 ENCOUNTER — Encounter: Payer: Self-pay | Admitting: Neurology

## 2013-10-15 ENCOUNTER — Ambulatory Visit (INDEPENDENT_AMBULATORY_CARE_PROVIDER_SITE_OTHER): Payer: Medicare Other | Admitting: Neurology

## 2013-10-15 VITALS — BP 184/89 | HR 60 | Temp 98.3°F | Ht 63.0 in | Wt 171.0 lb

## 2013-10-15 DIAGNOSIS — M25569 Pain in unspecified knee: Secondary | ICD-10-CM

## 2013-10-15 DIAGNOSIS — M545 Low back pain, unspecified: Secondary | ICD-10-CM

## 2013-10-15 DIAGNOSIS — R200 Anesthesia of skin: Secondary | ICD-10-CM

## 2013-10-15 DIAGNOSIS — Z8679 Personal history of other diseases of the circulatory system: Secondary | ICD-10-CM

## 2013-10-15 DIAGNOSIS — Z9889 Other specified postprocedural states: Secondary | ICD-10-CM

## 2013-10-15 DIAGNOSIS — Z95810 Presence of automatic (implantable) cardiac defibrillator: Secondary | ICD-10-CM

## 2013-10-15 DIAGNOSIS — R209 Unspecified disturbances of skin sensation: Secondary | ICD-10-CM

## 2013-10-15 DIAGNOSIS — M25561 Pain in right knee: Secondary | ICD-10-CM

## 2013-10-15 DIAGNOSIS — R29898 Other symptoms and signs involving the musculoskeletal system: Secondary | ICD-10-CM

## 2013-10-15 NOTE — Patient Instructions (Signed)
We will do blood work and an Lobbyist nerve and muscle test and call you. Physical therapy will help.

## 2013-10-15 NOTE — Progress Notes (Signed)
Subjective:    Patient ID: Lori York is a 68 y.o. female.  HPI    Huston Foley, MD, PhD [facility] [address] [address] [place], [place] [address]  Dear Dr. Darrick Penna,   I saw your patient, Lori York, upon your kind request in my neurologic clinic today for initial consultation of her gait disorder, and lower extremity numbness and weakness. The patient is unaccompanied by today. As you know, Lori York is a 68 year old right-handed woman with an underlying complicated medical history of heart disease, status post three-vessel CABG, abdominal aortic aneurysm, renal artery stenosis, OSA, chronic kidney disease, cardiomyopathy, chronic systolic heart failure, smoking, hypertension, long QT, who had a thoracic aortic endovascular stent graft and femoral endarterectomy with angioplasty on 08/04/2013. She is also status post open abdominal aortic aneurysm repair in June 2013. She has been complaining of bilateral lower extremity numbness and cold sensation particularly in her feet since her surgery in June. She feels weak in her legs and feels that her walking has changed she has had intermittent bowel incontinence in the past few weeks. On 10/07/2013 she had ABIs which showed mild to moderate arterial occlusive disease. Unfortunately, she has an ICD in place and cannot have a thoracic or lumbar spine MRI to rule out spinal cord ischemia with imaging test. She has a history of lower back surgery under Dr. Ophelia Charter in 2008. She has had recurrence of low back pain on the right side. She has had left lower leg numbness since her CABG. She has started having bilateral foot pain, numbness, cramping, and weakness which she feels has gradually worsened since June. She is scheduled for physical therapy evaluation next week. She has not seen her neurosurgeon back. She was on vacation at week or so ago and had sudden onset of bowel incontinence which lasted several days.  She has not had bladder incontinence.   Her Past Medical History Is Significant For: Past Medical History  Diagnosis Date  . Long Q-T syndrome     a.  w fx of sudden cardiac arrest;  b. s/p Guidant AICD  . HTN (hypertension)   . Glaucoma   . Noncompliance   . CAD (coronary artery disease)     a.  LHC 01/04/11: dLM 20%, pLAD 90%, oD1 80%, RCA 80%, EF 35-40%.;  b. 02/2011 CABG x 3: LIMA->LAD, VG->DIAG, VG->Dist RCA  . Ischemic cardiomyopathy     a. 12/2010 Echo: EF 20-25%, sept/apical AK, Sev Dil LV  . Chronic systolic heart failure   . AAA (abdominal aortic aneurysm)     a. Abd u/s 01/07/11: 5.3x5.5 cm infrarenal fusiform AAA.;  b.06/2011: CTA Abd: 6.2 cm infrarenal AAA  . HLD (hyperlipidemia)   . Asthma   . Renal artery stenosis     a.  LHC 11/12: prox left RA 80% stenosis  . Seizures     in 20's elevated bp  . GERD (gastroesophageal reflux disease)   . Anemia   . Peripheral vascular disease   . Pleural effusion, left     a. s/p thoracentesis x 2 04/2011 - total of 2.5L taken off  . Polymorphic ventricular tachycardia     s/p appropriate ICD therapy  . Dysrhythmia   . Automatic implantable cardioverter-defibrillator in situ   . Sleep apnea     "went for testing ~ 2013; don't have mask" (08/04/2013)  . Type II diabetes mellitus     loss weigh after surgery, not on diabetic meds (08/04/2013)  .  Peripheral neuropathy   . Fibromyalgia     "not sure" (6/245/2015)  . Anxiety     Her Past Surgical History Is Significant For: Past Surgical History  Procedure Laterality Date  . Cardiac defibrillator placement  1997-2011    in 1997 with revision to a  Guidant model 1860 single chamber implantable cardioverter- defib on Feb18, 2003, most recent generator change 2/11 by Dr Johney Frame SJM  . Lumbar laminectomy  May 2008    Dr. Ophelia Charter  . Knee reconstruction Right 11/1968  . Posterior lumbar fusion  11/20/07     LF5-S1, Dr. Ophelia Charter  . Back surgery    . Abdominal hysterectomy  1986  .  Hemorroidectomy  1975  . Coronary artery bypass graft  03/07/2011    Procedure: CORONARY ARTERY BYPASS GRAFTING (CABG);  Surgeon: Purcell Nails, MD;  Location: [facility] OR;  Service: Open Heart Surgery;  Laterality: N/A;  Coronary artery bypass graft times three using left internal mammary artery and left leg greater saphenous vein harvested endoscopically  . Abdominal aortic aneurysm repair  07/29/2011    Procedure: ANEURYSM ABDOMINAL AORTIC REPAIR;  Surgeon: Sherren Kerns, MD;  Location: [facility] OR;  Service: Vascular;  Laterality: N/A;  Resection and grafting Abdominal Aortic Aneurysm with straight 18mm x 30cm hemasheild graft  . Tonsillectomy and adenoidectomy  1954  . Thoracic aortic endovascular stent graft  08/04/2013  . Thoracentesis  04/2011 X 2  . Thoracic aortic endovascular stent graft N/A 08/04/2013    Procedure: THORACIC AORTIC ENDOVASCULAR STENT GRAFT;  Surgeon: Sherren Kerns, MD;  Location: [facility] OR;  Service: Vascular;  Laterality: N/A;  . Endarterectomy femoral Left 08/04/2013    Procedure: ENDARTERECTOMY FEMORAL;  Surgeon: Sherren Kerns, MD;  Location: [facility] OR;  Service: Vascular;  Laterality: Left;  . Patch angioplasty Left 08/04/2013    Procedure: PATCH ANGIOPLASTY, using bovine patch;  Surgeon: Sherren Kerns, MD;  Location: [facility] OR;  Service: Vascular;  Laterality: Left;    Her Family History Is Significant For: Family History  Problem Relation Age of Onset  . Long QT syndrome Mother   . Heart disease Mother     before age 55  . Hypertension Mother   . Long QT syndrome Sister   . Heart disease Sister   . Long QT syndrome Brother   . Heart disease Brother   . Heart attack Brother   . Long QT syndrome Daughter   . Heart disease Daughter   . Coronary artery disease Sister 93    Her Social History Is Significant For: History   Social History  . Marital Status: Widowed    Spouse Name: N/A    Number of Children: 2  . Years of Education: 12   Occupational History  .      Social History Main Topics  . Smoking status: Former Smoker -- 0.30 packs/day for 15 years    Types: Cigarettes    Quit date: 01/12/2011  . Smokeless tobacco: Never Used     Comment:    . Alcohol Use: No  . Drug Use: No  . Sexual Activity: No     Comment: hysterectomy   Other Topics Concern  . None   Social History Narrative   Lives in [place] with her adopted son who is 2 and a god brother, who is 48. 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 Patient is right handed, and resides with son,consumes one cup of  coffee daily.    Her Allergies Are:  Allergies  Allergen Reactions  . Tape Other (See Comments)    Pt states she struggles with removing the adhesive...   . Penicillins Rash  :   Her Current Medications Are:  Outpatient Encounter Prescriptions as of 10/15/2013  Medication Sig  . aspirin 325 MG EC tablet Take 325 mg by mouth daily.  . carvedilol (COREG) 25 MG tablet take 1 and 1/2 tablets by mouth twice a day  . furosemide (LASIX) 40 MG tablet Take 40 mg by mouth 2 (two) times daily.  . hydrALAZINE (APRESOLINE) 50 MG tablet take 1 tablet by mouth three times a day  . nitroGLYCERIN (NITROSTAT) 0.4 MG SL tablet Place 0.4 mg under the tongue every 5 (five) minutes as needed.  Marland Kitchen omeprazole (PRILOSEC) 20 MG capsule   . PARoxetine (PAXIL) 20 MG tablet Take 20 mg by mouth daily.   . potassium chloride SA (K-DUR,KLOR-CON) 20 MEQ tablet Take 0.5 tablets (10 mEq total) by mouth daily.  Marland Kitchen zolpidem (AMBIEN) 10 MG tablet Take 10 mg by mouth at bedtime as needed. For sleep   . [DISCONTINUED] oxyCODONE (OXY IR/ROXICODONE) 5 MG immediate release tablet Take 1 tablet (5 mg total) by mouth every 6 (six) hours as needed for moderate pain.  :   Review of Systems:  Out of a complete 14 point review of systems, all are reviewed and negative with the exception of these symptoms as listed below:   Review of Systems  Constitutional: Positive for  fever, chills and fatigue.  Eyes:       Blurred vision  Cardiovascular: Positive for leg swelling.  Gastrointestinal: Positive for diarrhea.  Endocrine: Positive for cold intolerance and heat intolerance.  Musculoskeletal:       Aching muscles  Neurological: Positive for weakness and numbness.       Memory loss,restless legs  Psychiatric/Behavioral:       Change in appetite    Objective:  Neurologic Exam  Physical Exam Physical Examination:   Filed Vitals:   10/15/13 0956  BP: 184/89  Pulse: 60  Temp: 98.3 F (36.8 C)   General Examination: The patient is a very pleasant 68 y.o. female in no acute distress. She appears well-developed and well-nourished and adequately groomed.   HEENT: Normocephalic, atraumatic, pupils are equal, round and reactive to light and accommodation. Funduscopic exam is normal with sharp disc margins noted. Extraocular tracking is good without limitation to gaze excursion or nystagmus noted. Normal smooth pursuit is noted. Hearing is grossly intact. Tympanic membranes are clear bilaterally. Face is symmetric with normal facial animation and normal facial sensation. Speech is clear with no dysarthria noted. There is no hypophonia. There is no lip, neck/head, jaw or voice tremor. Neck is supple with full range of passive and active motion. There are no carotid bruits on auscultation. Oropharynx exam reveals: moderate mouth dryness, adequate dental hygiene and moderate airway crowding, due to large tongue. Mallampati is class I. Tongue protrudes centrally and palate elevates symmetrically. Tonsils are absent.   Chest: Clear to auscultation without wheezing, rhonchi or crackles noted.  Heart: S1+S2+0, regular and normal without murmurs, rubs or gallops noted.   Abdomen: Soft, non-tender and non-distended with normal bowel sounds appreciated on auscultation.  Extremities: There is no pitting edema in the distal lower extremities bilaterally. Pedal pulses are  intact.  Skin: Warm and dry without trophic changes noted. There are no varicose veins.  Musculoskeletal: exam reveals no obvious joint deformities, tenderness  or joint swelling or erythema.   Neurologically:  Mental status: The patient is awake, alert and oriented in all 4 spheres. Her immediate and remote memory, attention, language skills and fund of knowledge are appropriate. There is no evidence of aphasia, agnosia, apraxia or anomia. Speech is clear with normal prosody and enunciation. Thought process is linear. Mood is normal and affect is normal.  Cranial nerves II - XII are as described above under HEENT exam. In addition: shoulder shrug is normal with equal shoulder height noted. Motor exam: Normal bulk, strength and tone is noted in the upper extremities bilaterally. In the lower extremities she has 4+ weakness in both hip flexors, right worse than left and otherwise global strength of 5 minus out of 5. There is no drift, tremor or rebound. Romberg is not tested as she is insecure with standing. Reflexes are 2+ in the upper extremities, 3+ in both knees and trace in both ankles and she has a 2 beat ankle clonus on the right side. Babinski: Toes are right side extensor; left side plantar. Fine motor skills and coordination: intact with normal finger taps, normal hand movements, normal rapid alternating patting, normal foot taps and normal foot agility.  Cerebellar testing: No dysmetria or intention tremor on finger to nose testing. Heel to shin is very difficult bilaterally due to exacerbation of right lower back pain.  Sensory exam: intact to light touch, pinprick, vibration, temperature sense in the upper extremities but decrease to all modalities in the left distal lower extremity and decrease to pinprick and temperature in the right foot.   Gait, station and balance: She stands with difficulty and reports pain in her right lower back. This is not radiating to the entire leg but radiating to  the gluteal region on the right. No veering to one side is noted. No leaning  forward some. She walks with an antalgic gait and a limp on the right. She does not have an overt foot drop. She turns slowly. Tandem walk is not tested due to insecurity walking.   Assessment and Plan:   In summary, Lori York is a very pleasant 67 y.o.-year old female with an underlying complicated medical history of heart disease, status post three-vessel CABG, abdominal aortic aneurysm, renal artery stenosis, OSA, chronic kidney disease, cardiomyopathy, chronic systolic heart failure, smoking, hypertension, long QT, who had a thoracic aortic endovascular stent graft and femoral endarterectomy with angioplasty on 08/04/2013. She is also status post open abdominal aortic aneurysm repair in June 2013. She has been complaining of bilateral lower extremity numbness and cold sensation particularly in her feet since her surgery in June. She feels weak in her legs and feels that her walking has changed she has had intermittent bowel incontinence in the past few weeks. Her situation is complicated. On examination she has abnormal findings in the lower extremities with hyperreflexia noted, mild weakness noted, right sided Babinski, numbness in both legs and she has right lower back pain. Unfortunately she cannot have an MRI due to ICD in place. She is advised that there is a possibility that she has had lower spinal cord ischemia. I would like to go ahead and investigate things with blood work for any other treatable causes or etiologies and we will do an EMG nerve conduction study. You may be able to do other imaging testing of her spine and spinal cord and she may benefit from seeing her neurosurgeon again. She is advised that from my end of things I would  like to do thesedratests and we will call her back and keep her posted with the test results. I think physical therapy would be quite beneficial to her. She is scheduled for  evaluation next week on the 11th. I will see her back soon and we will call her with her test results and take it from there. She was in agreement.   I answered all her questions today.  Thank you very much for allowing me to participate in the care of this nice patient. If I can be of any further assistance to you please do not hesitate to call me at [phone].  Sincerely,   Huston Foley, MD, PhD

## 2013-10-19 ENCOUNTER — Ambulatory Visit (INDEPENDENT_AMBULATORY_CARE_PROVIDER_SITE_OTHER): Payer: PRIVATE HEALTH INSURANCE | Admitting: *Deleted

## 2013-10-19 DIAGNOSIS — I5022 Chronic systolic (congestive) heart failure: Secondary | ICD-10-CM

## 2013-10-19 DIAGNOSIS — I4581 Long QT syndrome: Secondary | ICD-10-CM

## 2013-10-19 DIAGNOSIS — I472 Ventricular tachycardia: Secondary | ICD-10-CM

## 2013-10-19 DIAGNOSIS — I4729 Other ventricular tachycardia: Secondary | ICD-10-CM

## 2013-10-19 NOTE — Progress Notes (Signed)
Remote ICD transmission.   

## 2013-10-20 ENCOUNTER — Ambulatory Visit: Payer: PRIVATE HEALTH INSURANCE | Admitting: Physical Therapy

## 2013-10-20 LAB — MDC_IDC_ENUM_SESS_TYPE_REMOTE
Battery Remaining Longevity: 74 mo
Battery Remaining Percentage: 63 %
Battery Voltage: 2.98 V
Brady Statistic RV Percent Paced: 0 %
Date Time Interrogation Session: 20150908060028
HighPow Impedance: 37 Ohm
Implantable Pulse Generator Serial Number: 754557
Lead Channel Impedance Value: 310 Ohm
Lead Channel Pacing Threshold Amplitude: 1 V
Lead Channel Sensing Intrinsic Amplitude: 12 mV
Lead Channel Setting Pacing Amplitude: 2.5 V
Lead Channel Setting Sensing Sensitivity: 0.5 mV
MDC IDC MSMT LEADCHNL RV PACING THRESHOLD PULSEWIDTH: 0.4 ms
MDC IDC SET LEADCHNL RV PACING PULSEWIDTH: 0.4 ms
MDC IDC SET ZONE DETECTION INTERVAL: 280 ms
Zone Setting Detection Interval: 350 ms

## 2013-10-25 ENCOUNTER — Encounter: Payer: Self-pay | Admitting: Cardiology

## 2013-10-26 ENCOUNTER — Encounter: Payer: Self-pay | Admitting: Internal Medicine

## 2013-10-26 ENCOUNTER — Encounter: Payer: Medicare Other | Admitting: Radiology

## 2013-10-26 ENCOUNTER — Encounter: Payer: Medicare Other | Admitting: Neurology

## 2013-11-03 ENCOUNTER — Ambulatory Visit: Payer: PRIVATE HEALTH INSURANCE | Admitting: Vascular Surgery

## 2013-11-10 ENCOUNTER — Encounter: Payer: Self-pay | Admitting: Vascular Surgery

## 2013-11-11 ENCOUNTER — Ambulatory Visit: Payer: PRIVATE HEALTH INSURANCE | Admitting: Vascular Surgery

## 2013-11-30 ENCOUNTER — Encounter: Payer: Self-pay | Admitting: Vascular Surgery

## 2013-12-01 ENCOUNTER — Encounter: Payer: Self-pay | Admitting: Vascular Surgery

## 2013-12-01 ENCOUNTER — Ambulatory Visit (INDEPENDENT_AMBULATORY_CARE_PROVIDER_SITE_OTHER): Payer: PRIVATE HEALTH INSURANCE | Admitting: Vascular Surgery

## 2013-12-01 VITALS — BP 198/86 | HR 59 | Ht 64.0 in | Wt 178.0 lb

## 2013-12-01 DIAGNOSIS — Z95828 Presence of other vascular implants and grafts: Secondary | ICD-10-CM

## 2013-12-01 DIAGNOSIS — M79606 Pain in leg, unspecified: Secondary | ICD-10-CM

## 2013-12-01 DIAGNOSIS — I714 Abdominal aortic aneurysm, without rupture, unspecified: Secondary | ICD-10-CM

## 2013-12-01 NOTE — Progress Notes (Signed)
   Office Progress Note  History of Present Illness  Lori York is a 68 y.o. (1945/06/30) female who is s/p thoracic aortic endovascular stent graft with left femoral endartectomy on 08/04/13. She was last seen in August. At that time she was having a change in her gait with bilateral lower extremity weakness as well as some bowel urgency bordering on incontinence. She was seen by neurology. She returns for followup today. Her leg strength has improved significantly. She still is having some issues with bowel control. Overall however she feels improved. She did not do physical therapy.  On ROS today: she denies any non-healing wounds of her feet. She denies shortness of breath. She denies chest pain.  Physical Examination  Filed Vitals:   12/01/13 1410  BP: 198/86  Pulse: 59  Height: 5\' 4"  (1.626 m)  Weight: 178 lb (80.74 kg)  SpO2: 100%      General: A&O x 3, WD female in no acute distress   Pulmonary: Sym exp, good air movt, CTAB, no rales, rhonchi, & wheezing  Cardiac: RRR, Nl S1, S2, no Murmurs, rubs or gallops  Vascular: Vessel  Right  Left   Aorta  Not palpable  N/A   Femoral  Palpable  Palpable   Popliteal  Not palpable  Not palpable   PT  Palpable  Palpable   DP  Palpable  Palpable     Musculoskeletal: M/S 5/5 throughout. Extremities without ischemic changes.   Neurologic: Pain and light touch intact in extremities. Extreme sensitivity to touch on plantar aspect of feet. Patient is able to walk with essentially a normal gait today. She states that after walking 15-20 minutes that she does become more week in her legs.  ABIs (10/07/13) R: 0.74 L: 0.86  Assessment: The patient has had prior infrarenal aortic aneurysm repair. She subsequently underwent descending thoracic stent graft repair. 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 She had developed a shuffling-type gait and weakness in both lower extremities but is overall improved. He was unable to obtain an  MRI due to an AICD. She would be at some risk of spinal cord ischemia due to coverage of her descending thoracic aorta.  I have offered physical therapy today. She will also try some Imodium to see if she gets better bowel control. I also encouraged her to continue followup with neurology if she has continued symptoms. Otherwise she will followup with [place] in 6 months time for repeat CT scan of the chest to check the integrity of her thoracic stent graft.  [name], MD [facility] Office: [phone] Pager: [phone]

## 2013-12-01 NOTE — Addendum Note (Signed)
Addended by: Sharee Pimple on: 12/01/2013 04:44 PM   Modules accepted: Orders

## 2013-12-06 ENCOUNTER — Other Ambulatory Visit: Payer: Self-pay | Admitting: Internal Medicine

## 2013-12-14 ENCOUNTER — Ambulatory Visit: Payer: Medicare Other | Admitting: Neurology

## 2014-01-20 ENCOUNTER — Telehealth: Payer: Self-pay | Admitting: Cardiology

## 2014-01-20 ENCOUNTER — Encounter: Payer: PRIVATE HEALTH INSURANCE | Admitting: *Deleted

## 2014-01-20 NOTE — Telephone Encounter (Signed)
Spoke with pt and reminded pt of remote transmission that is due today. Pt verbalized understanding.   

## 2014-01-21 ENCOUNTER — Encounter: Payer: Self-pay | Admitting: Cardiology

## 2014-05-06 ENCOUNTER — Encounter: Payer: Self-pay | Admitting: *Deleted

## 2014-08-12 ENCOUNTER — Encounter: Payer: Self-pay | Admitting: *Deleted

## 2014-09-06 ENCOUNTER — Encounter: Payer: Self-pay | Admitting: *Deleted

## 2014-09-21 ENCOUNTER — Encounter: Payer: Self-pay | Admitting: Vascular Surgery

## 2014-09-22 ENCOUNTER — Inpatient Hospital Stay: Admission: RE | Admit: 2014-09-22 | Payer: PRIVATE HEALTH INSURANCE | Source: Ambulatory Visit

## 2014-09-22 ENCOUNTER — Ambulatory Visit: Payer: PRIVATE HEALTH INSURANCE | Admitting: Vascular Surgery

## 2014-09-22 ENCOUNTER — Inpatient Hospital Stay: Admission: RE | Admit: 2014-09-22 | Payer: Medicare Other | Source: Ambulatory Visit

## 2014-10-07 ENCOUNTER — Encounter: Payer: Self-pay | Admitting: *Deleted

## 2014-10-18 ENCOUNTER — Encounter: Payer: Self-pay | Admitting: *Deleted

## 2014-10-25 ENCOUNTER — Other Ambulatory Visit: Payer: Self-pay

## 2014-10-25 ENCOUNTER — Telehealth: Payer: Self-pay

## 2014-10-25 NOTE — Telephone Encounter (Signed)
Phone call from pt.  Reported she is in [place] this week, and wanted to schedule an appt. with Dr. Darrick Penna.  Reported she missed her appt. 09/22/14, because she was in [place] for a period of time, helping her son.  Stated she will be returning to [place] soon.  Advised that there are no office appts. Available this Thurs., to see Dr. Darrick Penna.  Advised I will have an office scheduler call her to offer available appt. options.  Verb. understanding.

## 2014-10-26 ENCOUNTER — Telehealth: Payer: Self-pay | Admitting: Vascular Surgery

## 2014-10-26 NOTE — Telephone Encounter (Signed)
Patient is moving to [place] and will be coming in to pick up her recordsl

## 2015-01-13 ENCOUNTER — Telehealth: Payer: Self-pay | Admitting: Cardiology

## 2015-01-13 NOTE — Telephone Encounter (Signed)
Spoke w/ pt and requested that she send a manual transmission w/ her home monitor b/c we have not received one in at least 8 days. She verbalized understanding   

## 2015-01-25 ENCOUNTER — Telehealth: Payer: Self-pay | Admitting: Cardiology

## 2015-01-25 NOTE — Telephone Encounter (Signed)
LMOVM requesting pt to send a remote transmission b/c her home monitor has not updated in at least 8 days.

## 2015-07-03 ENCOUNTER — Encounter: Payer: Self-pay | Admitting: Cardiology

## 2015-07-07 ENCOUNTER — Telehealth: Payer: Self-pay

## 2015-07-07 NOTE — Telephone Encounter (Signed)
Phone call from pt.  Reported she has a "lump" to the left of the umbilicus, that she has had approx. 8 mos.  Reported at her last office visit, she reported to Dr. Darrick Penna that she felt some movement in the left side of abdomen.  Reported there is no pain in raised area.  Denied redness/inflammation.  Stated "it's not firm or soft, but in between."  Unaware if the raised area goes down with laying flat.  Requested to be seen next week, as she is traveling to [place].  Advised will have a scheduler contact her with an appt.

## 2015-07-11 NOTE — Telephone Encounter (Signed)
Sched appt for 6/8 at 2:45. Spoke to pt to inform them of appt.

## 2015-07-14 ENCOUNTER — Encounter: Payer: Self-pay | Admitting: Vascular Surgery

## 2015-07-20 ENCOUNTER — Ambulatory Visit: Payer: Medicare Other | Admitting: Vascular Surgery

## 2015-09-19 ENCOUNTER — Other Ambulatory Visit: Payer: Self-pay

## 2015-09-19 DIAGNOSIS — Z48812 Encounter for surgical aftercare following surgery on the circulatory system: Secondary | ICD-10-CM

## 2015-09-19 DIAGNOSIS — Z9889 Other specified postprocedural states: Principal | ICD-10-CM

## 2015-09-19 DIAGNOSIS — Z8679 Personal history of other diseases of the circulatory system: Secondary | ICD-10-CM

## 2015-09-20 ENCOUNTER — Other Ambulatory Visit: Payer: Self-pay | Admitting: *Deleted

## 2015-09-20 DIAGNOSIS — Z48812 Encounter for surgical aftercare following surgery on the circulatory system: Secondary | ICD-10-CM

## 2015-09-20 DIAGNOSIS — I716 Thoracoabdominal aortic aneurysm, without rupture, unspecified: Secondary | ICD-10-CM

## 2015-09-25 ENCOUNTER — Telehealth: Payer: Self-pay | Admitting: Vascular Surgery

## 2015-09-25 NOTE — Telephone Encounter (Addendum)
-----   Message from Phillips Odor, RN sent at 09/19/2015  1:37 PM EDT ----- Regarding: FW: need recommendation on CT to be done with or without contrast Please schedule for CT Chest/ Abdomen/ Pelvis without contrast, with her  Office appt ----- Message ----- From: Sherren Kerns, MD Sent: 09/19/2015   1:24 PM To: Phillips Odor, RN Subject: RE: need recommendation on CT to be done wit#  Without contrast is fine ----- Message ----- From: Phillips Odor, RN Sent: 09/19/2015   1:06 PM To: Sherren Kerns, MD Subject: need recommendation on CT to be done with or#  Please advise on CT order; this pt. Has hx of Thoracic Aortic Stent Graft 07/2013.  Her last CT of chest/abd/pelvis was without contrast. (may have been due to elevated creatinine) Do you want her to have CTA chest/abd/pelvis, or CT without contrast of chest/ abd/ pelvis?    Patient is aware of the following appts for CT on 9.6.17 and to see Dr.Fields on 9.7.17/

## 2015-10-09 ENCOUNTER — Other Ambulatory Visit: Payer: Medicare Other

## 2015-10-10 ENCOUNTER — Encounter: Payer: Self-pay | Admitting: Vascular Surgery

## 2015-10-17 ENCOUNTER — Telehealth: Payer: Self-pay

## 2015-10-17 ENCOUNTER — Other Ambulatory Visit: Payer: Self-pay | Admitting: Vascular Surgery

## 2015-10-17 NOTE — Telephone Encounter (Signed)
Received call from [place] at [facility] stating [facility] would not cover CT abd/pel and CT chest due to her having a out of state plan, it would be considered out of network. The patient would be responsible for $1559. Spoke with patient and she stated she "could not afford that". Appointment at [facility] was cancelled  at patient's request.

## 2015-10-18 ENCOUNTER — Inpatient Hospital Stay: Admission: RE | Admit: 2015-10-18 | Payer: Medicare Other | Source: Ambulatory Visit

## 2015-10-18 ENCOUNTER — Other Ambulatory Visit: Payer: Medicare Other

## 2015-10-19 ENCOUNTER — Encounter: Payer: Self-pay | Admitting: Vascular Surgery

## 2015-10-19 ENCOUNTER — Ambulatory Visit (INDEPENDENT_AMBULATORY_CARE_PROVIDER_SITE_OTHER): Payer: Medicare Other | Admitting: Vascular Surgery

## 2015-10-19 VITALS — BP 136/79 | HR 63 | Temp 98.4°F | Resp 16 | Ht 64.0 in | Wt 177.0 lb

## 2015-10-19 DIAGNOSIS — I714 Abdominal aortic aneurysm, without rupture, unspecified: Secondary | ICD-10-CM

## 2015-10-19 DIAGNOSIS — I712 Thoracic aortic aneurysm, without rupture, unspecified: Secondary | ICD-10-CM

## 2015-10-19 NOTE — Progress Notes (Signed)
HISTORY AND PHYSICAL     CC:  Follow up. Referring Provider:  Fleet Contras, MD  HPI: This is a 70 y.o. female who is s/p thoracic aortic endovascular stent graft with left femoral endartectomy on 08/04/13 and hx of open repair of AAA 07/29/11 by Dr. Darrick Penna.  She was last seen in 2015 as she was moving to [place].  She did move and has now returned to [place].  She states that the weakness in her legs is about the same and she has issues with stairs.  She states that she has nerve pain in her left foot and is sensitive to touch and this is not new.  She states that her bowels are incontinent as she has no feeling and does wear pads/diapers.  She states that she has not seen a neurologist or had PT.  She states that she has a lump in her belly just to the left of her umbilicus.  She states that it has gotten harder over time.    She is on a daily aspirin.  She takes a beta blocker.  She does have a hx of tobacco use, but quit 2 years ago.  Past Medical History:  Diagnosis Date  . AAA (abdominal aortic aneurysm) (HCC)    a. Abd u/s 01/07/11: 5.3x5.5 cm infrarenal fusiform AAA.;  b.06/2011: CTA Abd: 6.2 cm infrarenal AAA  . Anemia   . Anxiety   . Asthma   . Automatic implantable cardioverter-defibrillator in situ   . CAD (coronary artery disease)    a.  LHC 01/04/11: dLM 20%, pLAD 90%, oD1 80%, RCA 80%, EF 35-40%.;  b. 02/2011 CABG x 3: LIMA->LAD, VG->DIAG, VG->Dist RCA  . Chronic systolic heart failure (HCC)   . Dysrhythmia   . Fibromyalgia    "not sure" (6/245/2015)  . GERD (gastroesophageal reflux disease)   . Glaucoma   . HLD (hyperlipidemia)   . HTN (hypertension)   . Ischemic cardiomyopathy    a. 12/2010 Echo: EF 20-25%, sept/apical AK, Sev Dil LV  . Long Q-T syndrome    a.  w fx of sudden cardiac arrest;  b. s/p Guidant AICD  . Noncompliance   . Peripheral neuropathy (HCC)   . Peripheral vascular disease (HCC)   . Pleural effusion, left    a. s/p thoracentesis x 2 04/2011 - total  of 2.5L taken off  . Polymorphic ventricular tachycardia (HCC)    s/p appropriate ICD therapy  . Renal artery stenosis (HCC)    a.  LHC 11/12: prox left RA 80% stenosis  . Seizures (HCC)    in 20's elevated bp  . Sleep apnea    "went for testing ~ 2013; don't have mask" (08/04/2013)  . Type II diabetes mellitus (HCC)    loss weigh after surgery, not on diabetic meds (08/04/2013)    Past Surgical History:  Procedure Laterality Date  . ABDOMINAL AORTIC ANEURYSM REPAIR  07/29/2011   Procedure: ANEURYSM ABDOMINAL AORTIC REPAIR;  Surgeon: Sherren Kerns, MD;  Location: [facility] OR;  Service: Vascular;  Laterality: N/A;  Resection and grafting Abdominal Aortic Aneurysm with straight 44mm x 30cm hemasheild graft  . ABDOMINAL HYSTERECTOMY  1986  . BACK SURGERY    . CARDIAC DEFIBRILLATOR PLACEMENT  [id number]   in 1997 with revision to a  Guidant model 1860 single chamber implantable cardioverter- defib on Feb18, 2003, most recent generator change 2/11 by Dr Johney Frame SJM  . CORONARY ARTERY BYPASS GRAFT  03/07/2011   Procedure: CORONARY ARTERY BYPASS  GRAFTING (CABG);  Surgeon: Purcell Nailslarence H Owen, MD;  [facility];  Service: Open Heart Surgery;  Laterality: N/A;  Coronary artery bypass graft times three using left internal mammary artery and left leg greater saphenous vein harvested endoscopically  . ENDARTERECTOMY FEMORAL Left 08/04/2013   Procedure: ENDARTERECTOMY FEMORAL;  Surgeon: Sherren Kernsharles E Lavita Pontius, MD;  [facility];  Service: Vascular;  Laterality: Left;  . HEMORROIDECTOMY  1975  . KNEE RECONSTRUCTION Right 11/1968  . LUMBAR LAMINECTOMY  May 2008   Dr. Ophelia CharterYates  . PATCH ANGIOPLASTY Left 08/04/2013   Procedure: PATCH ANGIOPLASTY, using bovine patch;  Surgeon: Sherren Kernsharles E Alvan Culpepper, MD;  [facility];  Service: Vascular;  Laterality: Left;  . POSTERIOR LUMBAR FUSION  11/20/07    LF5-S1, Dr. Ophelia CharterYates  . THORACENTESIS  04/2011 X 2  . THORACIC AORTIC ENDOVASCULAR STENT GRAFT  08/04/2013  . THORACIC AORTIC  ENDOVASCULAR STENT GRAFT N/A 08/04/2013   Procedure: THORACIC AORTIC ENDOVASCULAR STENT GRAFT;  Surgeon: Sherren Kernsharles E Pearl Bents, MD;  [facility];  Service: Vascular;  Laterality: N/A;  . TONSILLECTOMY AND ADENOIDECTOMY  1954    Allergies  Allergen Reactions  . Tape Other (See Comments)    Pt states she struggles with removing the adhesive...   . Penicillins Rash    Current Outpatient Prescriptions  Medication Sig Dispense Refill  . aspirin 325 MG EC tablet Take 325 mg by mouth daily.    . carvedilol (COREG) 25 MG tablet take 1 1/2 tablets by mouth twice a day 90 tablet 11  . furosemide (LASIX) 40 MG tablet Take 40 mg by mouth 2 (two) times daily.    . hydrALAZINE (APRESOLINE) 50 MG tablet take 1 tablet by mouth three times a day 270 tablet 2  . omeprazole (PRILOSEC) 20 MG capsule     . PARoxetine (PAXIL) 20 MG tablet Take 20 mg by mouth daily.     . potassium chloride SA (K-DUR,KLOR-CON) 20 MEQ tablet Take 0.5 tablets (10 mEq total) by mouth daily. 45 tablet 1  . zolpidem (AMBIEN) 10 MG tablet Take 10 mg by mouth at bedtime as needed. For sleep     . nitroGLYCERIN (NITROSTAT) 0.4 MG SL tablet Place 0.4 mg under the tongue every 5 (five) minutes as needed.     No current facility-administered medications for this visit.     Family History  Problem Relation Age of Onset  . Long QT syndrome Mother   . Heart disease Mother     before age 68  . Hypertension Mother   . Long QT syndrome Sister   . Heart disease Sister   . Long QT syndrome Brother   . Heart disease Brother   . Heart attack Brother   . Long QT syndrome Daughter   . Heart disease Daughter   . Coronary artery disease Sister 563    Social History   Social History  . Marital status: Widowed    Spouse name: N/A  . Number of children: 2  . Years of education: 12   Occupational History  .  Disabled   Social History Main Topics  . Smoking status: Former Smoker    Packs/day: 0.30    Years: 15.00    Types:  Cigarettes    Quit date: 01/12/2011  . Smokeless tobacco: Never Used     Comment:    . Alcohol use No  . Drug use: No  . Sexual activity: No     Comment: hysterectomy   Other Topics Concern  .  Not on file   Social History Narrative   Lives in [place] with her adopted son who is 59 and a god brother, who is 20. 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 Patient is right handed, and resides with son,consumes one cup of coffee daily.     REVIEW OF SYSTEMS:   [X]  denotes positive finding, [ ]  denotes negative finding Cardiac  Comments:  Chest pain or chest pressure:    Shortness of breath upon exertion:    Short of breath when lying flat:    Irregular heart rhythm:        Vascular    Pain in calf, thigh, or hip brought on by ambulation: x   Pain in feet at night that wakes you up from your sleep:  x   Blood clot in your veins:    Leg swelling:         Pulmonary    Oxygen at home:    Productive cough:     Wheezing:         Neurologic    Sudden weakness in arms or legs:     Sudden numbness in arms or legs:     Sudden onset of difficulty speaking or slurred speech:    Temporary loss of vision in one eye:     Problems with dizziness:         Gastrointestinal    Blood in stool:     Vomited blood:         Genitourinary    Burning when urinating:     Blood in urine:        Psychiatric    Major depression:         Hematologic    Bleeding problems:    Problems with blood clotting too easily:        Skin    Rashes or ulcers:        Constitutional    Fever or chills:      PHYSICAL EXAMINATION:  Vitals:   10/19/15 1402  BP: 136/79  Pulse: 63  Resp: 16  Temp: 98.4 F (36.9 C)   Body mass index is 30.38 kg/m.  General:  WDWN in NAD; vital signs documented above Gait: Not observed HENT: WNL, normocephalic Pulmonary: normal non-labored breathing , without Rales, rhonchi,  wheezing Cardiac: regular HR, without   Murmurs, rubs or gallops; without carotid bruits Abdomen: soft, aorta is not palpable; tender to deep palpation just left to the umbilicus with small mass Skin: without rashes Vascular Exam/Pulses:  Right Left  Radial 2+ (normal) 2+ (normal)  Ulnar Unable to palpate  Unable to palpate   Femoral 2+ (normal) 2+ (normal)  Popliteal Unable to palpate Unable to palpate   DP 1+ (weak) 2+ (normal)  PT Unable to palpate  Unable to palpate    Extremities: without ischemic changes, without Gangrene , without cellulitis; without open wounds; mild ankle/pedal edema left leg; calf is not tender.   Musculoskeletal: no muscle wasting or atrophy  Neurologic: A&O X 3;  No focal weakness or paresthesias are detected Psychiatric:  The pt has Normal affect.   Non-Invasive Vascular Imaging:   None this visit.   Pt meds includes: Statin:  No. Beta Blocker:  Yes.   Aspirin:  Yes.   ACEI:  No. ARB:  No. Other Antiplatelet/Anticoagulant:  No.    ASSESSMENT/PLAN:: 70 y.o. female who is s/p thoracic aortic endovascular stent graft with left  femoral endartectomy on 08/04/13 and hx of open repair of AAA 07/29/11 by Dr. Darrick Penna   -pt continues to have weakness in her BLE and bowel incontinence.  She did not f/u with a neurologist after last visit.  She would like to pursue that now.  We will also set her up with [facility]. -she does have palpable bilateral DP pulses -her insurance denied CTA as her [employer] needs to be transferred from [place], which should take about a week.  She will call us when this is done and have the CTA.  She does have a small area of tenderness left of her umbilicus, which could be a small hernia.  Will review this on CT scan.  Dr. Darrick Penna will call pt with results.  Will see pt back in one year.   [name], PA-C [facility] [phone]  History and exam findings as above. Patient still has lower extremity weakness which has been  persistent since October 2015. She has really had no improvement in this. She also has some episodes of bowel incontinence. We will send her for neurologic evaluation. She may have some element of spinal cord injury from her previous thoracic stent graft. I believe she would benefit from physical therapy in either case. As soon as her insurance is straightened out we will obtain a repeat CT scan of the chest abdomen and pelvis. If this has no significant findings she will follow-up with [place] in 1 year. If she does have an abdominal hernia she is interested in considering getting this repaired.  [name], MD [facility] [place] Office: [phone] Pager: [phone]   Clinic MD:  Pt seen and examined in conjunction with Dr. Darrick Penna

## 2015-10-30 ENCOUNTER — Encounter: Payer: Self-pay | Admitting: Cardiology

## 2015-12-04 ENCOUNTER — Other Ambulatory Visit: Payer: Medicare Other

## 2015-12-05 ENCOUNTER — Other Ambulatory Visit: Payer: Medicare Other

## 2016-02-21 NOTE — Progress Notes (Signed)
disregard  This encounter was created in error - please disregard.

## 2016-05-02 ENCOUNTER — Telehealth: Payer: Self-pay | Admitting: *Deleted

## 2016-05-02 NOTE — Telephone Encounter (Signed)
Spoke with patient.  She states that she is currently in [place], [place], but she should be back in [place] next week.  Advised patient that she is overdue for follow-up with Dr. Johney Frame.  Patient verbalizes understanding and is agreeable to scheduling an appointment, but she will have to call back when she returns to [facility].  Patient reports she had her ICD checked in the ED in [place] last week and her battery has 4 years remaining.  Gave patient direct phone number for [facility].  She is appreciative and denies additional questions or concerns at this time.

## 2016-06-13 ENCOUNTER — Encounter: Payer: Self-pay | Admitting: *Deleted

## 2016-06-13 NOTE — Telephone Encounter (Signed)
Letter mailed requesting ICD f/u.

## 2016-06-27 ENCOUNTER — Encounter: Payer: Self-pay | Admitting: *Deleted

## 2016-06-27 NOTE — Telephone Encounter (Signed)
Certified letter mailed requesting follow-up. 

## 2016-07-05 ENCOUNTER — Encounter: Payer: Self-pay | Admitting: Internal Medicine

## 2016-08-07 NOTE — Telephone Encounter (Signed)
Certified letter returned unsigned, not deliverable as addressed, unable to forward.

## 2018-03-14 DEATH — deceased

## 2019-02-07 ENCOUNTER — Encounter: Payer: Self-pay | Admitting: Cardiology
